# Patient Record
Sex: Female | Born: 1980
Health system: Southern US, Community
[De-identification: ages and names within clinical notes are randomized; demographics above are authoritative.]

## PROBLEM LIST (undated history)

## (undated) DIAGNOSIS — I10 Essential (primary) hypertension: Secondary | ICD-10-CM

## (undated) DIAGNOSIS — K219 Gastro-esophageal reflux disease without esophagitis: Secondary | ICD-10-CM

## (undated) DIAGNOSIS — G43909 Migraine, unspecified, not intractable, without status migrainosus: Secondary | ICD-10-CM

## (undated) HISTORY — DX: Essential (primary) hypertension: I10

## (undated) HISTORY — DX: Gastro-esophageal reflux disease without esophagitis: K21.9

## (undated) HISTORY — DX: Migraine, unspecified, not intractable, without status migrainosus: G43.909

## (undated) HISTORY — PX: CHOLECYSTECTOMY: SHX55

---

## 2000-07-09 ENCOUNTER — Encounter: Payer: Self-pay | Admitting: Obstetrics & Gynecology

## 2000-07-09 ENCOUNTER — Inpatient Hospital Stay (HOSPITAL_COMMUNITY): Admission: AD | Admit: 2000-07-09 | Discharge: 2000-07-09 | Payer: Self-pay | Admitting: Family Medicine

## 2000-11-15 ENCOUNTER — Other Ambulatory Visit: Admission: RE | Admit: 2000-11-15 | Discharge: 2000-11-15 | Payer: Self-pay | Admitting: Family Medicine

## 2001-01-05 ENCOUNTER — Ambulatory Visit (HOSPITAL_COMMUNITY): Admission: RE | Admit: 2001-01-05 | Discharge: 2001-01-05 | Payer: Self-pay | Admitting: Family Medicine

## 2001-01-05 ENCOUNTER — Encounter: Payer: Self-pay | Admitting: Family Medicine

## 2001-06-01 ENCOUNTER — Inpatient Hospital Stay (HOSPITAL_COMMUNITY): Admission: AD | Admit: 2001-06-01 | Discharge: 2001-06-03 | Payer: Self-pay | Admitting: Family Medicine

## 2001-06-12 ENCOUNTER — Encounter: Payer: Self-pay | Admitting: Family Medicine

## 2001-06-12 ENCOUNTER — Inpatient Hospital Stay (HOSPITAL_COMMUNITY): Admission: AD | Admit: 2001-06-12 | Discharge: 2001-06-12 | Payer: Self-pay | Admitting: Family Medicine

## 2001-12-11 ENCOUNTER — Encounter: Admission: RE | Admit: 2001-12-11 | Discharge: 2001-12-11 | Payer: Self-pay | Admitting: Family Medicine

## 2001-12-11 ENCOUNTER — Encounter: Payer: Self-pay | Admitting: Family Medicine

## 2002-04-22 ENCOUNTER — Encounter: Payer: Self-pay | Admitting: Family Medicine

## 2002-04-22 ENCOUNTER — Ambulatory Visit (HOSPITAL_COMMUNITY): Admission: RE | Admit: 2002-04-22 | Discharge: 2002-04-22 | Payer: Self-pay | Admitting: Family Medicine

## 2002-06-04 ENCOUNTER — Other Ambulatory Visit: Admission: RE | Admit: 2002-06-04 | Discharge: 2002-06-04 | Payer: Self-pay | Admitting: Family Medicine

## 2002-06-17 ENCOUNTER — Encounter: Payer: Self-pay | Admitting: Family Medicine

## 2002-06-17 ENCOUNTER — Encounter: Admission: RE | Admit: 2002-06-17 | Discharge: 2002-06-17 | Payer: Self-pay | Admitting: Family Medicine

## 2002-07-19 ENCOUNTER — Ambulatory Visit (HOSPITAL_COMMUNITY): Admission: RE | Admit: 2002-07-19 | Discharge: 2002-07-19 | Payer: Self-pay | Admitting: Family Medicine

## 2002-07-19 ENCOUNTER — Encounter: Payer: Self-pay | Admitting: Family Medicine

## 2002-10-11 ENCOUNTER — Inpatient Hospital Stay (HOSPITAL_COMMUNITY): Admission: AD | Admit: 2002-10-11 | Discharge: 2002-10-11 | Payer: Self-pay | Admitting: Family Medicine

## 2002-12-06 ENCOUNTER — Inpatient Hospital Stay (HOSPITAL_COMMUNITY): Admission: AD | Admit: 2002-12-06 | Discharge: 2002-12-09 | Payer: Self-pay | Admitting: Family Medicine

## 2004-03-12 ENCOUNTER — Other Ambulatory Visit: Admission: RE | Admit: 2004-03-12 | Discharge: 2004-03-12 | Payer: Self-pay | Admitting: Family Medicine

## 2005-05-10 ENCOUNTER — Ambulatory Visit (HOSPITAL_COMMUNITY): Admission: RE | Admit: 2005-05-10 | Discharge: 2005-05-10 | Payer: Self-pay | Admitting: General Surgery

## 2005-05-10 ENCOUNTER — Encounter (INDEPENDENT_AMBULATORY_CARE_PROVIDER_SITE_OTHER): Payer: Self-pay | Admitting: Specialist

## 2006-05-09 ENCOUNTER — Other Ambulatory Visit: Admission: RE | Admit: 2006-05-09 | Discharge: 2006-05-09 | Payer: Self-pay | Admitting: Family Medicine

## 2007-10-23 ENCOUNTER — Encounter: Admission: RE | Admit: 2007-10-23 | Discharge: 2007-10-23 | Payer: Self-pay | Admitting: Family Medicine

## 2010-02-12 ENCOUNTER — Ambulatory Visit (HOSPITAL_COMMUNITY): Admission: RE | Admit: 2010-02-12 | Discharge: 2010-02-12 | Payer: Self-pay | Admitting: Family Medicine

## 2010-05-15 ENCOUNTER — Encounter: Payer: Self-pay | Admitting: Family Medicine

## 2010-09-10 NOTE — Op Note (Signed)
Kelly Jennings, Kelly Jennings               ACCOUNT NO.:  [id number]   MEDICAL RECORD NO.:  [id number]          PATIENT TYPE:  AMB   LOCATION:  DAY                          FACILITY:  [facility]   PHYSICIAN:  Timothy E. Earlene Plater, M.D. DATE OF BIRTH:  1980/11/26   DATE OF PROCEDURE:  05/10/2005  DATE OF DISCHARGE:                                 OPERATIVE REPORT   PREOPERATIVE DIAGNOSIS:  Cholecystolithiasis.   POSTOPERATIVE DIAGNOSIS:  Cholecystolithiasis.   PROCEDURE:  Laparoscopic cholecystectomy with intraoperative cholangiogram.   SURGEON:  Timothy E. Earlene Plater, M.D.   ASSISTANT:  Raelyn Mora.   ANESTHESIA:  CRNA supervised the general endotracheal.   Ms. Diver is 89.  Slightly overweight.  Recent gravida 2.  Symptomatic  gallstones of an urgent nature.  She is fully informed before proceeding to  surgery.   Patient was taken to the operating room .  General endotracheal anesthesia  was administered.  The abdomen was prepped in the usual sterile fashion.  Marcaine 0.25% with epinephrine was used prior to each skin incision.  A  horizontal infraumbilical incision was made and fashioned vertically.  The  peritoneum entered without incident.  The Hasson cannula placed and tied in  place with a #1 Vicryl.  The abdomen insufflated.  General peritoneoscopy  was unremarkable.  A second 10 mm trocar was placed, and in the  epigastricum, two 5 mm trocars were unremarkable.  The gallbladder was tense  and full.  It was grasped and drawn up superior to the right.  Attention was  turned to the base of the gallbladder, where a normal-appearing cystic duct  and cystic artery were dissected out and clearly identified.  The cystic  artery was clipped x3  The cystic duct was clipped with the gallbladder.  An  opening made.  Trashy bile exuded.  It was irrigated, and then a catheter  was placed percutaneously into the cystic duct stump.  A real-time  cholangiogram was made and showed complete filling of the  biliary tree with  rapid emptying into the duodenum.  No defects were noted.  The clip and  catheter were removed.  The ribbon of the cystic duct triply clipped and  divided.  The artery divided and then the gallbladder was dissected from the  gallbladder bed without complication except for absence of dissecting space  and some hemorrhagic acute changes in the bed of the gallbladder.  The  gallbladder was placed in an EndoCatch bag, put aside, and the bed of the  gallbladder was carefully evaluated and cauterized where needed.  With that  complete, the bleeding stopped, and the irrigant cleared, the gallbladder  was removed from the abdomen through the infraumbilical incision, which was  tied under direct vision.  Copious irrigation was used, and then when it was  all clear, the irrigation, CO2 instruments, and trocars were removed under  direct vision.  Counts were correct.  She had tolerated it well.  Each wound  inspected and  closed with 4-0 Monocryl, Steri-Strips, and dry sterile dressing.  The  gallbladder was set aside.  I did remove one stone  for the patient, as she  requested, and the rest of the stones and specimens were sent to the lab.  She will be observed and discharged this evening, if all goes well.      Timothy E. Earlene Plater, M.D.  Electronically Signed     TED/MEDQ  D:  05/10/2005  T:  05/10/2005  Job:  [id number]   cc:   Ernestina Penna, M.D.  Fax: [phone]

## 2012-07-12 ENCOUNTER — Telehealth: Payer: Self-pay | Admitting: Nurse Practitioner

## 2012-07-12 NOTE — Telephone Encounter (Signed)
Can you please make appointment I cant make one right away

## 2012-07-12 NOTE — Telephone Encounter (Signed)
Tingling in hands and blood pressure running high. What to do? Take extra BP meds?

## 2012-07-12 NOTE — Telephone Encounter (Signed)
Please advise 

## 2012-07-12 NOTE — Telephone Encounter (Signed)
appt made

## 2012-07-12 NOTE — Telephone Encounter (Signed)
Patient says B/P running in 140's systolic. Says that hands are numb feeling since she woke up this morning. Spoke with patient please call her back and make her appointment.

## 2012-07-13 ENCOUNTER — Ambulatory Visit: Payer: Self-pay | Admitting: Nurse Practitioner

## 2012-09-24 ENCOUNTER — Telehealth: Payer: Self-pay | Admitting: Family Medicine

## 2012-09-24 ENCOUNTER — Ambulatory Visit (INDEPENDENT_AMBULATORY_CARE_PROVIDER_SITE_OTHER): Payer: BC Managed Care – PPO

## 2012-09-24 ENCOUNTER — Ambulatory Visit (INDEPENDENT_AMBULATORY_CARE_PROVIDER_SITE_OTHER): Payer: BC Managed Care – PPO | Admitting: Physician Assistant

## 2012-09-24 ENCOUNTER — Encounter: Payer: Self-pay | Admitting: Physician Assistant

## 2012-09-24 DIAGNOSIS — M25512 Pain in left shoulder: Secondary | ICD-10-CM

## 2012-09-24 DIAGNOSIS — M25519 Pain in unspecified shoulder: Secondary | ICD-10-CM

## 2012-09-24 DIAGNOSIS — K219 Gastro-esophageal reflux disease without esophagitis: Secondary | ICD-10-CM

## 2012-09-24 DIAGNOSIS — I10 Essential (primary) hypertension: Secondary | ICD-10-CM | POA: Insufficient documentation

## 2012-09-24 NOTE — Patient Instructions (Signed)

## 2012-09-24 NOTE — Telephone Encounter (Signed)
APPT MADE FOR TODAY WITH [facility]

## 2012-09-24 NOTE — Progress Notes (Signed)
Subjective:     Patient ID: Kelly Jennings, female   DOB: 07-24-1980, 32 y.o.   MRN: [id number]  HPI Pt with intermit L shoulder pain She states she will feel a pop and have intense pain to the L shoulder She denies any trauma to the L shoulder prev No prev hx know of sublux/dislocation Sx will last for a day and resolve Last sx were yest No sx at time of visit  Review of Systems  All other systems reviewed and are negative.       Objective:   Physical Exam  Nursing note and vitals reviewed.  NAD No TTP entire shoulder/scapular area No palp spasm No area of ecchy seen FROM w/o crepitus or click Good strength No sx with restricted abduction, on ant pronation/supination Good grip,sensory, and pulses distal Xray- no abnl seen, will be over-read    Assessment:     1. Shoulder pain, left   2. HTN (hypertension)   3. GERD (gastroesophageal reflux disease)        Plan:     Conserv tx for now Shoulder strengthening exercises reviewed OTC NSAIDS Heat/Ice If sx cont consider MRI

## 2012-10-16 ENCOUNTER — Encounter: Payer: Self-pay | Admitting: General Practice

## 2012-10-16 ENCOUNTER — Ambulatory Visit (INDEPENDENT_AMBULATORY_CARE_PROVIDER_SITE_OTHER): Payer: BC Managed Care – PPO | Admitting: General Practice

## 2012-10-16 VITALS — BP 124/93 | HR 74 | Temp 98.0°F | Ht 62.0 in | Wt 166.0 lb

## 2012-10-16 DIAGNOSIS — J322 Chronic ethmoidal sinusitis: Secondary | ICD-10-CM

## 2012-10-16 MED ORDER — AZITHROMYCIN 250 MG PO TABS: ORAL_TABLET | ORAL | Status: DC

## 2012-10-16 NOTE — Progress Notes (Signed)
  Subjective:    Patient ID: Kelly Jennings, female    DOB: 10/08/1980, 32 y.o.   MRN: [id number]  HPI Presents today with complaints of drainage down back of throat and ear pressure. Reports drainage is worse at night. Reports onset of drainage was two days ago and ear pressure this am. Reports taking claritin without relief.     Review of Systems  Constitutional: Negative for fever and chills.  HENT: Positive for ear pain, rhinorrhea, postnasal drip and sinus pressure. Negative for congestion, sore throat, neck pain, neck stiffness and ear discharge.   Respiratory: Negative for cough, chest tightness, shortness of breath and wheezing.   Cardiovascular: Negative for chest pain and palpitations.       Objective:   Physical Exam  Constitutional: She is oriented to person, place, and time. She appears well-developed and well-nourished.  HENT:  Head: Normocephalic and atraumatic.  Nose: Right sinus exhibits maxillary sinus tenderness. Left sinus exhibits maxillary sinus tenderness.  Cardiovascular: Normal rate, regular rhythm and normal heart sounds.   Pulmonary/Chest: Effort normal and breath sounds normal. No respiratory distress. She exhibits no tenderness.  Neurological: She is alert and oriented to person, place, and time.  Skin: Skin is warm and dry.  Psychiatric: She has a normal mood and affect.          Assessment & Plan:  1. Ethmoid sinusitis - azithromycin (ZITHROMAX) 250 MG tablet; Take as directed  Dispense: 6 tablet; Refill: 0 -Rest -increase fluid intake -change toothbrush in 3 days -RTO if symptoms worsen or seek emergency medical treatment -Patient verbalized understanding -Coralie Keens, FNP-C

## 2012-10-16 NOTE — Patient Instructions (Addendum)

## 2013-04-01 ENCOUNTER — Encounter: Payer: Self-pay | Admitting: General Practice

## 2013-04-01 ENCOUNTER — Ambulatory Visit (INDEPENDENT_AMBULATORY_CARE_PROVIDER_SITE_OTHER): Payer: BC Managed Care – PPO | Admitting: General Practice

## 2013-04-01 VITALS — BP 137/95 | HR 75 | Temp 98.3°F | Ht 62.5 in | Wt 161.0 lb

## 2013-04-01 DIAGNOSIS — Z0000 Encounter for general adult medical examination without abnormal findings: Secondary | ICD-10-CM

## 2013-04-01 DIAGNOSIS — I10 Essential (primary) hypertension: Secondary | ICD-10-CM

## 2013-04-01 DIAGNOSIS — K219 Gastro-esophageal reflux disease without esophagitis: Secondary | ICD-10-CM

## 2013-04-01 DIAGNOSIS — G43909 Migraine, unspecified, not intractable, without status migrainosus: Secondary | ICD-10-CM

## 2013-04-01 DIAGNOSIS — Z124 Encounter for screening for malignant neoplasm of cervix: Secondary | ICD-10-CM

## 2013-04-01 DIAGNOSIS — Z01419 Encounter for gynecological examination (general) (routine) without abnormal findings: Secondary | ICD-10-CM

## 2013-04-01 MED ORDER — TOPIRAMATE 25 MG PO TABS: 25.0000 mg | ORAL_TABLET | Freq: Every day | ORAL | Status: DC

## 2013-04-01 MED ORDER — ESOMEPRAZOLE MAGNESIUM 40 MG PO CPDR: 40.0000 mg | DELAYED_RELEASE_CAPSULE | Freq: Every day | ORAL | Status: DC

## 2013-04-01 MED ORDER — HYDROCHLOROTHIAZIDE 25 MG PO TABS: 25.0000 mg | ORAL_TABLET | Freq: Every day | ORAL | Status: DC

## 2013-04-01 NOTE — Patient Instructions (Signed)

## 2013-04-01 NOTE — Progress Notes (Signed)
Subjective:    Patient ID: Kelly Jennings, female    DOB: 07-13-1980, 32 y.o.   MRN: [id number]  HPI Patient presents today for annual exam. She has a history of hypertension, gerd, and headaches. She reports normally taking medications as prescribed, but has been out of hctz for 1 month. She reports being more stressed recently due to her job loss, that will occur on May 09, 2013 due to business relocating. Reports her husband is currently employed. She denies thoughts of harming self or others.    Review of Systems  Constitutional: Negative for fever and chills.  Respiratory: Negative for chest tightness and shortness of breath.   Cardiovascular: Negative for chest pain and palpitations.  Gastrointestinal: Negative for nausea, vomiting, abdominal pain, constipation and blood in stool.  Genitourinary: Negative for dysuria, hematuria and difficulty urinating.  Neurological: Negative for dizziness, weakness and headaches.       Objective:   Physical Exam  Constitutional: She is oriented to person, place, and time. She appears well-developed and well-nourished.  HENT:  Head: Normocephalic and atraumatic.  Right Ear: External ear normal.  Left Ear: External ear normal.  Mouth/Throat: Oropharynx is clear and moist.  Eyes: Conjunctivae and EOM are normal. Pupils are equal, round, and reactive to light.  Neck: Normal range of motion. Neck supple. No thyromegaly present.  Cardiovascular: Normal rate, regular rhythm, normal heart sounds and intact distal pulses.   Pulmonary/Chest: Effort normal and breath sounds normal. No respiratory distress. She exhibits no tenderness. Right breast exhibits no inverted nipple, no mass, no nipple discharge, no skin change and no tenderness. Left breast exhibits no inverted nipple, no mass, no nipple discharge, no skin change and no tenderness. Breasts are symmetrical.  Abdominal: Soft. Bowel sounds are normal. She exhibits no distension.    Genitourinary: Vagina normal and uterus normal. No breast swelling, tenderness, discharge or bleeding. No labial fusion. There is no rash, tenderness, lesion or injury on the right labia. There is no rash, tenderness, lesion or injury on the left labia. Uterus is not deviated, not enlarged, not fixed and not tender. Cervix exhibits no motion tenderness, no discharge and no friability. Right adnexum displays no mass, no tenderness and no fullness. Left adnexum displays no mass, no tenderness and no fullness. No erythema, tenderness or bleeding around the vagina. No foreign body around the vagina. No signs of injury around the vagina. No vaginal discharge found.  Lymphadenopathy:    She has no cervical adenopathy.  Neurological: She is alert and oriented to person, place, and time.  Skin: Skin is warm and dry.  Psychiatric: She has a normal mood and affect.          Assessment & Plan:  1. Annual physical exam  - POCT CBC - CMP14+EGFR - Pap IG w/ reflex to HPV when ASC-U  2. Hypertension  - hydrochlorothiazide (HYDRODIURIL) 25 MG tablet; Take 1 tablet (25 mg total) by mouth daily.  Dispense: 90 tablet; Refill: 1 -discussed importance of taking meds as prescribed and not being without medications  3. Migraine headache  - topiramate (TOPAMAX) 25 MG tablet; Take 1 tablet (25 mg total) by mouth daily.  Dispense: 90 tablet; Refill: 1  4. GERD (gastroesophageal reflux disease)  - esomeprazole (NEXIUM) 40 MG capsule; Take 1 capsule (40 mg total) by mouth daily before breakfast.  Dispense: 90 capsule; Refill: 1 -Continue all current medications Labs pending F/u in 3 months Discussed benefits of regular exercise and healthy eating Patient verbalized  understanding Coralie Keens, FNP-C

## 2013-04-03 LAB — PAP IG W/ RFLX HPV ASCU

## 2013-04-16 ENCOUNTER — Telehealth: Payer: Self-pay | Admitting: *Deleted

## 2013-04-16 NOTE — Telephone Encounter (Signed)
Message copied by Almeta Monas on Tue Apr 16, 2013  3:03 PM ------      Message from: Philomena Doheny E      Created: Mon Apr 08, 2013  8:27 AM       Please inform pap results are negative. Follow up with routine ------

## 2013-04-16 NOTE — Telephone Encounter (Signed)
Details left with normal pap results.

## 2013-05-30 ENCOUNTER — Ambulatory Visit (INDEPENDENT_AMBULATORY_CARE_PROVIDER_SITE_OTHER): Payer: BC Managed Care – PPO | Admitting: Family Medicine

## 2013-05-30 ENCOUNTER — Encounter (INDEPENDENT_AMBULATORY_CARE_PROVIDER_SITE_OTHER): Payer: Self-pay

## 2013-05-30 ENCOUNTER — Encounter: Payer: Self-pay | Admitting: Family Medicine

## 2013-05-30 ENCOUNTER — Telehealth: Payer: Self-pay | Admitting: General Practice

## 2013-05-30 VITALS — BP 117/79 | HR 80 | Temp 97.4°F | Ht 62.5 in | Wt 158.0 lb

## 2013-05-30 DIAGNOSIS — E876 Hypokalemia: Secondary | ICD-10-CM

## 2013-05-30 DIAGNOSIS — I951 Orthostatic hypotension: Secondary | ICD-10-CM

## 2013-05-30 DIAGNOSIS — R5381 Other malaise: Secondary | ICD-10-CM

## 2013-05-30 DIAGNOSIS — K2990 Gastroduodenitis, unspecified, without bleeding: Secondary | ICD-10-CM

## 2013-05-30 DIAGNOSIS — R5383 Other fatigue: Secondary | ICD-10-CM

## 2013-05-30 DIAGNOSIS — K2970 Gastritis, unspecified, without bleeding: Secondary | ICD-10-CM

## 2013-05-30 DIAGNOSIS — D72829 Elevated white blood cell count, unspecified: Secondary | ICD-10-CM

## 2013-05-30 DIAGNOSIS — K219 Gastro-esophageal reflux disease without esophagitis: Secondary | ICD-10-CM

## 2013-05-30 DIAGNOSIS — R110 Nausea: Secondary | ICD-10-CM

## 2013-05-30 DIAGNOSIS — R55 Syncope and collapse: Secondary | ICD-10-CM

## 2013-05-30 LAB — POCT UA - MICROSCOPIC ONLY
Bacteria, U Microscopic: NEGATIVE
Casts, Ur, LPF, POC: NEGATIVE
Crystals, Ur, HPF, POC: NEGATIVE
Mucus, UA: NEGATIVE
RBC, urine, microscopic: NEGATIVE
Yeast, UA: NEGATIVE

## 2013-05-30 LAB — POCT URINALYSIS DIPSTICK
Bilirubin, UA: NEGATIVE
Blood, UA: NEGATIVE
Glucose, UA: NEGATIVE
Nitrite, UA: NEGATIVE
Protein, UA: NEGATIVE
Spec Grav, UA: 1.01
Urobilinogen, UA: NEGATIVE
pH, UA: 7.5

## 2013-05-30 LAB — POCT CBC
Granulocyte percent: 93.6 %G — AB (ref 37–80)
HCT, POC: 43.6 % (ref 37.7–47.9)
Hemoglobin: 14.6 g/dL (ref 12.2–16.2)
Lymph, poc: 0.4 — AB (ref 0.6–3.4)
MCH, POC: 31.5 pg — AB (ref 27–31.2)
MCHC: 33.4 g/dL (ref 31.8–35.4)
MCV: 94.4 fL (ref 80–97)
MPV: 8.6 fL (ref 0–99.8)
POC Granulocyte: 8.9 — AB (ref 2–6.9)
POC LYMPH PERCENT: 4.1 %L — AB (ref 10–50)
Platelet Count, POC: 168 10*3/uL (ref 142–424)
RBC: 4.6 M/uL (ref 4.04–5.48)
RDW, POC: 12.7 %
WBC: 9.5 10*3/uL (ref 4.6–10.2)

## 2013-05-30 LAB — POCT INFLUENZA A/B
Influenza A, POC: NEGATIVE
Influenza B, POC: NEGATIVE

## 2013-05-30 MED ORDER — POTASSIUM CHLORIDE CRYS ER 20 MEQ PO TBCR: 20.0000 meq | EXTENDED_RELEASE_TABLET | Freq: Two times a day (BID) | ORAL | Status: DC

## 2013-05-30 MED ORDER — SUCRALFATE 1 G PO TABS: 1.0000 g | ORAL_TABLET | Freq: Three times a day (TID) | ORAL | Status: DC

## 2013-05-30 MED ORDER — ONDANSETRON 8 MG PO TBDP: 8.0000 mg | ORAL_TABLET | Freq: Three times a day (TID) | ORAL | Status: DC | PRN

## 2013-05-30 MED ORDER — CIPROFLOXACIN HCL 500 MG PO TABS: 500.0000 mg | ORAL_TABLET | Freq: Two times a day (BID) | ORAL | Status: DC

## 2013-05-30 NOTE — Telephone Encounter (Signed)
Appt given for today per patients request 

## 2013-05-30 NOTE — Progress Notes (Signed)
Subjective:    Patient ID: Kelly Jennings, female    DOB: 1981-02-14, 33 y.o.   MRN: [id number]  HPI This 33 y.o. female presents for evaluation of syncopal episode last evening.  She went to [facility] and was told she needs to go see her primary care provider for syncope and for slow heart rate.  She states she passed out 3 times yesterday.  She states she was told that her heart rate Was slow on the EKG at the hospital.  She is nauseated and weak feeling.  She states she was given Some potassium from the ED for low potassium.  She states she has some epigastric discomfort and she is nauseated.  She is dieting and on weight loss medicine.  She states she has been passing out For 3 years now.  She states her syncope has become increased over the last few days.  Last night she awoke at 0230 and got up to urinate and then ambulated to the toilet and then while voiding had Abdominal spasm or pain and then had beads of sweat develop and then she fell off the commode and hit her chin on the floor which required her to go to the ED.  Her records from [facility] ED states she had normal EKg, normal CXR, elevated wbc of 14.4,  Low K of 3.1, negative CE's, and normal PTINR.  She received sutures for a laceration to her chin.   Review of Systems C/o fatigue, syncope, and nausea No chest pain, SOB, HA, dizziness, vision change, N/V, diarrhea, constipation, dysuria, urinary urgency or frequency, myalgias, arthralgias or rash.     Objective:   Physical Exam Vital signs noted  Well developed well nourished female.  HEENT - Head atraumatic Normocephalic                Eyes - PERRLA, Conjuctiva - clear Sclera- Clear EOMI                Ears - EAC's Wnl TM's Wnl Gross Hearing WNL                Throat - oropharanx wnl                 Chin - Laceration approximated well with sutures. Respiratory - Lungs CTA bilateral Cardiac - RRR S1 and S2 without murmur.  Standing BP 90/60, HR - 110 GI -  Abdomen soft tender epigastric area and bowel sounds active x 4    Results for orders placed in visit on 05/30/13  POCT UA - MICROSCOPIC ONLY      Result Value Range   WBC, Ur, HPF, POC 5-10     RBC, urine, microscopic neg     Bacteria, U Microscopic neg     Mucus, UA neg     Epithelial cells, urine per micros occ     Crystals, Ur, HPF, POC neg     Casts, Ur, LPF, POC neg     Yeast, UA neg    POCT URINALYSIS DIPSTICK      Result Value Range   Color, UA yellow     Clarity, UA clear     Glucose, UA neg     Bilirubin, UA neg     Ketones, UA mod     Spec Grav, UA 1.010     Blood, UA neg     pH, UA 7.5     Protein, UA neg     Urobilinogen, UA negative  Nitrite, UA neg     Leukocytes, UA Trace    POCT INFLUENZA A/B      Result Value Range   Influenza A, POC Negative     Influenza B, POC Negative        Assessment & Plan:  Syncope - Plan: POCT CBC, POCT UA - Microscopic Only, POCT urinalysis dipstick, BMP8+EGFR, Vitamin B12, Ambulatory referral to Cardiology, CANCELED: EKG 12-Lead. Since she has this as ongoing problem refer to cardiology to r/o arrythmia.  Discussed she needs To DC diet medicine, and HCTZ.    Orthostasis - Plan: POCT CBC, POCT UA - Microscopic Only, POCT urinalysis dipstick, BMP8+EGFR, Vitamin B12, ciprofloxacin (CIPRO) 500 MG tablet.   DC HCTZ  Other malaise and fatigue - Plan: POCT CBC, POCT UA - Microscopic Only, POCT urinalysis dipstick, BMP8+EGFR, Vitamin B12, TSH, POCT Influenza A/B.  DC HCTZ  Leukocytosis, unspecified - Plan: ciprofloxacin (CIPRO) 500 MG tablet po bid x 7 days for suspected UTI.  Order UA cx.  Hypokalemia - Plan: potassium chloride SA (K-DUR,KLOR-CON) 20 MEQ tablet po bid x 5 days  Gastritis - Plan: sucralfate (CARAFATE) 1 G tablet po qac and hx x 2 weeks  GERD (gastroesophageal reflux disease) - Plan: sucralfate (CARAFATE) 1 G tablet and continue with nexium  Nausea alone - Plan: ondansetron (ZOFRAN ODT) 8 MG disintegrating  tablet  Follow up next week  Lysbeth Penner FNP

## 2013-05-31 ENCOUNTER — Telehealth: Payer: Self-pay | Admitting: Family Medicine

## 2013-05-31 LAB — BMP8+EGFR
BUN/Creatinine Ratio: 17 (ref 8–20)
BUN: 11 mg/dL (ref 6–20)
CO2: 23 mmol/L (ref 18–29)
Calcium: 9.3 mg/dL (ref 8.7–10.2)
Chloride: 102 mmol/L (ref 97–108)
Creatinine, Ser: 0.64 mg/dL (ref 0.57–1.00)
GFR calc Af Amer: 136 mL/min/{1.73_m2} (ref >59)
GFR calc non Af Amer: 118 mL/min/{1.73_m2} (ref >59)
Glucose: 102 mg/dL — ABNORMAL HIGH (ref 65–99)
Potassium: 5.1 mmol/L (ref 3.5–5.2)
Sodium: 141 mmol/L (ref 134–144)

## 2013-05-31 LAB — VITAMIN B12: Vitamin B-12: 299 pg/mL (ref 211–946)

## 2013-05-31 LAB — URINE CULTURE: Organism ID, Bacteria: NO GROWTH

## 2013-05-31 LAB — TSH: TSH: 0.372 u[IU]/mL — ABNORMAL LOW (ref 0.450–4.500)

## 2013-06-03 ENCOUNTER — Other Ambulatory Visit: Payer: Self-pay | Admitting: Family Medicine

## 2013-06-03 NOTE — Telephone Encounter (Signed)
Pt called to check on referral status. I gave her the number to call to make the appointment herself since it was in the workqueue. Pt is scheduled for 06/15/23 and letter sent also as a reminder with the appointment date/time

## 2013-06-10 ENCOUNTER — Encounter: Payer: Self-pay | Admitting: Cardiovascular Disease

## 2013-06-10 ENCOUNTER — Ambulatory Visit: Payer: BC Managed Care – PPO | Admitting: Family Medicine

## 2013-06-12 ENCOUNTER — Ambulatory Visit (INDEPENDENT_AMBULATORY_CARE_PROVIDER_SITE_OTHER): Payer: BC Managed Care – PPO | Admitting: Cardiovascular Disease

## 2013-06-12 ENCOUNTER — Encounter: Payer: Self-pay | Admitting: Cardiovascular Disease

## 2013-06-12 VITALS — BP 130/80 | HR 78 | Ht 62.5 in | Wt 158.0 lb

## 2013-06-12 DIAGNOSIS — R55 Syncope and collapse: Secondary | ICD-10-CM

## 2013-06-12 DIAGNOSIS — I10 Essential (primary) hypertension: Secondary | ICD-10-CM

## 2013-06-12 NOTE — Patient Instructions (Signed)
You have been referred to NEXT  AVAILABLE  WITH  EP  FOR   VASO  VAGAL  SYNCOPE Your physician recommends that you continue on your current medications as directed. Please refer to the Current Medication list given to you today.  Your physician has requested that you have an exercise tolerance test. For further information please visit [email or web address]. Please also follow instruction sheet, as given.   Your physician has requested that you have an echocardiogram. Echocardiography is a painless test that uses sound waves to create images of your heart. It provides your doctor with information about the size and shape of your heart and how well your heart's chambers and valves are working. This procedure takes approximately one hour. There are no restrictions for this procedure.

## 2013-06-12 NOTE — Progress Notes (Signed)
Patient ID: Delton CoombesRegina D Jennings, female   DOB: 1980/08/26, 33 y.o.   MRN: [id number]  33 yo referred by [facility] for syncope.  Seen there 05/30/13  Has had stereotypical symptoms for years.  Wakes up in middle of night with sensation that she has to urinate.  Goes to bathroom and has trouble going.  The gets nausea and stomach cramping followed by diaphoresis and a wave of light headedness. Has hit the back of her head once and on 2/5 had small cut on her chin.  No family history of sudden death.  No brothers or sisters.  Father had bypass at older age.  In ER she reports that she had an episode and BP "bottomed out" but HR was fine.  No seizure activity.  No history of neurologic abnormalities.  Has not had recurrence in last 2 weeks.  Had these types of episodes for years.  No previous cardiac w/u     ROS: Denies fever, malais, weight loss, blurry vision, decreased visual acuity, cough, sputum, SOB, hemoptysis, pleuritic pain, palpitaitons, heartburn, abdominal pain, melena, lower extremity edema, claudication, or rash.  All other systems reviewed and negative   General: Affect appropriate Healthy:  appears stated age HEENT: normal Neck supple with no adenopathy JVP normal no bruits no thyromegaly Lungs clear with no wheezing and good diaphragmatic motion Heart:  S1/S2 no murmur,rub, gallop or click PMI normal Abdomen: benighn, BS positve, no tenderness, no AAA no bruit.  No HSM or HJR Distal pulses intact with no bruits No edema Neuro non-focal Skin warm and dry No muscular weakness  Medications Current Outpatient Prescriptions  Medication Sig Dispense Refill  . ciprofloxacin (CIPRO) 500 MG tablet Take 1 tablet (500 mg total) by mouth 2 (two) times daily.  20 tablet  0  . esomeprazole (NEXIUM) 40 MG capsule Take 1 capsule (40 mg total) by mouth daily before breakfast.  90 capsule  1  . hydrochlorothiazide (HYDRODIURIL) 25 MG tablet Take 1 tablet (25 mg total) by mouth daily.  90 tablet   1  . ondansetron (ZOFRAN ODT) 8 MG disintegrating tablet Take 1 tablet (8 mg total) by mouth every 8 (eight) hours as needed for nausea or vomiting.  20 tablet  0  . potassium chloride SA (K-DUR,KLOR-CON) 20 MEQ tablet Take 1 tablet (20 mEq total) by mouth 2 (two) times daily.  10 tablet  0  . sucralfate (CARAFATE) 1 G tablet Take 1 tablet (1 g total) by mouth 4 (four) times daily -  with meals and at bedtime.  56 tablet  0  . topiramate (TOPAMAX) 25 MG tablet Take 1 tablet (25 mg total) by mouth daily.  90 tablet  1   No current facility-administered medications for this visit.    Allergies Penicillins  Family History: Family History  Problem Relation Age of Onset  . Hyperlipidemia Mother   . Hypertension Mother   . Hyperlipidemia Father   . Hypertension Father   . Diabetes Father     Social History: History   Social History  . Marital Status: Married    Spouse Name: N/A    Number of Children: N/A  . Years of Education: N/A   Occupational History  . Not on file.   Social History Main Topics  . Smoking status: Never Smoker   . Smokeless tobacco: Never Used  . Alcohol Use: No  . Drug Use: No  . Sexual Activity: Not on file   Other Topics Concern  . Not on  file   Social History Narrative  . No narrative on file    Electrocardiogram:  SR rate 72 normal   Assessment and Plan

## 2013-06-12 NOTE — Assessment & Plan Note (Signed)
Seems like classic stereo typical episodes with vasodepressor response not accompanied by bradycardia.  Agree with stopping diuretic K was only 3.1 in ER  F/U ETT and echo to r/o structural heart disease and assess hemodynamic response to stress/exercise.  F/U with EP to further discuss

## 2013-06-12 NOTE — Assessment & Plan Note (Signed)
Diuretic stopped in ER 2 weeks ago f/u with primary Consider amlodipine if anything needs to be restarted

## 2013-06-13 ENCOUNTER — Other Ambulatory Visit (INDEPENDENT_AMBULATORY_CARE_PROVIDER_SITE_OTHER): Payer: BC Managed Care – PPO

## 2013-06-13 ENCOUNTER — Other Ambulatory Visit: Payer: Self-pay | Admitting: *Deleted

## 2013-06-13 ENCOUNTER — Telehealth: Payer: Self-pay | Admitting: Cardiovascular Disease

## 2013-06-13 ENCOUNTER — Ambulatory Visit: Payer: BC Managed Care – PPO | Admitting: *Deleted

## 2013-06-13 DIAGNOSIS — E0590 Thyrotoxicosis, unspecified without thyrotoxic crisis or storm: Secondary | ICD-10-CM

## 2013-06-13 DIAGNOSIS — R55 Syncope and collapse: Secondary | ICD-10-CM

## 2013-06-13 NOTE — Progress Notes (Signed)
Pt came in for labs only 

## 2013-06-13 NOTE — Telephone Encounter (Signed)
Returned call to patient no answer.LMTC. 

## 2013-06-13 NOTE — Telephone Encounter (Signed)
New Problem:  Pt is requesting a doctors note to excuse her from work for yesterday - 06/12/13.   [phone] - Pt's fax #

## 2013-06-13 NOTE — Progress Notes (Signed)
Patient ID: Kelly CoombesRegina D Ellis, female   DOB: 1980-11-14, 33 y.o.   MRN: [id number] holter monitor placed

## 2013-06-14 ENCOUNTER — Institutional Professional Consult (permissible substitution): Payer: Self-pay | Admitting: Cardiovascular Disease

## 2013-06-14 ENCOUNTER — Other Ambulatory Visit: Payer: Self-pay | Admitting: *Deleted

## 2013-06-14 DIAGNOSIS — E0590 Thyrotoxicosis, unspecified without thyrotoxic crisis or storm: Secondary | ICD-10-CM

## 2013-06-14 LAB — THYROID PANEL WITH TSH
Free Thyroxine Index: 1.9 (ref 1.2–4.9)
T3 Uptake Ratio: 26 % (ref 24–39)
T4, Total: 7.3 ug/dL (ref 4.5–12.0)
TSH: 0.866 u[IU]/mL (ref 0.450–4.500)

## 2013-06-17 NOTE — Telephone Encounter (Signed)
HAD APPT ON  06-12-13 MAY   I WRITE  WORK  EXCUSE FOR THAT DAY .Zack Seal/CY

## 2013-06-17 NOTE — Telephone Encounter (Signed)
Ok

## 2013-06-18 ENCOUNTER — Encounter: Payer: Self-pay | Admitting: *Deleted

## 2013-06-18 NOTE — Telephone Encounter (Signed)
Pt notified will fax  Work excuse note ./cy

## 2013-06-19 ENCOUNTER — Encounter (HOSPITAL_COMMUNITY): Payer: Self-pay

## 2013-06-20 ENCOUNTER — Ambulatory Visit (HOSPITAL_COMMUNITY): Payer: Self-pay

## 2013-06-21 ENCOUNTER — Ambulatory Visit (HOSPITAL_COMMUNITY)
Admission: RE | Admit: 2013-06-21 | Discharge: 2013-06-21 | Disposition: A | Discharge status: Home / Self Care | Payer: BC Managed Care – PPO | Source: Ambulatory Visit | Attending: Cardiovascular Disease | Admitting: Cardiovascular Disease

## 2013-06-21 DIAGNOSIS — R55 Syncope and collapse: Secondary | ICD-10-CM | POA: Insufficient documentation

## 2013-06-25 ENCOUNTER — Institutional Professional Consult (permissible substitution): Payer: Self-pay | Admitting: Internal Medicine

## 2013-06-25 ENCOUNTER — Telehealth: Payer: Self-pay | Admitting: *Deleted

## 2013-06-25 NOTE — Telephone Encounter (Signed)
Normal ETT ----- Message ----- From: Mickel Duhamelobin C. Moffitt Sent: 06/24/2013 2:43 PM To: Wendall StadePeter C Nishan, MD  PT  AWARE  OF STRESS TEST RESULTS .Zack Seal/CY

## 2013-06-26 ENCOUNTER — Telehealth: Payer: Self-pay | Admitting: Cardiovascular Disease

## 2013-06-26 NOTE — Telephone Encounter (Signed)
New Prob   Pt reports cold chills, dizziness, nausea, abdominal cramping, and lightheadedness upon standing and sometimes at rest. Pt is concerned and would like to speak to a nurse regarding this.

## 2013-06-26 NOTE — Telephone Encounter (Signed)
LMTCB ./CY 

## 2013-06-26 NOTE — Telephone Encounter (Signed)
SPOKE WITH  PT   RE MESSAGE PER  PT  THESE  SYMPTOMS ARE  WHAT  SHE EXPERIENCES  PRIOR TO   PASSING OUT  WAS  INSTRUCTED TO CALL  IF EXPERIENCED AGAIN  HAS  ECHO  AND APPT WITH  DR  Ladona RidgelAYLOR TOMORROW  HOLTER   AND  GXT  WERE BOTH NORMAL   ENCOURAGED PT TO KEEP APPT'S TOM  WILL FORWARD TO  DR Eden EmmsNISHAN FOR  REVIEW .Zack Seal/CY

## 2013-06-27 ENCOUNTER — Ambulatory Visit (HOSPITAL_COMMUNITY)
Admission: RE | Admit: 2013-06-27 | Discharge: 2013-06-27 | Disposition: A | Discharge status: Home / Self Care | Payer: BC Managed Care – PPO | Source: Ambulatory Visit | Attending: Cardiology | Admitting: Cardiology

## 2013-06-27 ENCOUNTER — Encounter: Payer: Self-pay | Admitting: Internal Medicine

## 2013-06-27 ENCOUNTER — Encounter: Payer: Self-pay | Admitting: *Deleted

## 2013-06-27 ENCOUNTER — Ambulatory Visit (INDEPENDENT_AMBULATORY_CARE_PROVIDER_SITE_OTHER): Payer: BC Managed Care – PPO | Admitting: Internal Medicine

## 2013-06-27 VITALS — BP 123/89 | HR 84 | Ht 62.0 in | Wt 162.0 lb

## 2013-06-27 DIAGNOSIS — R55 Syncope and collapse: Secondary | ICD-10-CM

## 2013-06-27 DIAGNOSIS — I10 Essential (primary) hypertension: Secondary | ICD-10-CM

## 2013-06-27 NOTE — Progress Notes (Signed)
      HPI Kelly Jennings is referred today by Dr. Eden EmmsNishan for evaluation of syncope. She is a very pleasant 33 yo woman who notes that her episodes of syncope began approximately 3 years ago after a bad episode of the flu. Since then she has had 7 episodes of syncope, always associated with getting up out of bed in the middle of the night and going to the bathroom. She will have a few second warning and then pass out. No loss of bowel or bladder continence. She is out for seconds at a time. After the episodes occur, she will fool tired for several hours. No heart racing or palpitations. She has not seriously injured herself but has had diarrhea after the episodes. The patient admits to excessive caffiene intake. She also admits to diaphoresis and abdominal cramps. Evaluation to date has been unremarkable with normal LV function. Allergies  Allergen Reactions  . Penicillins      Current Outpatient Prescriptions  Medication Sig Dispense Refill  . ondansetron (ZOFRAN) 8 MG tablet Take by mouth every 8 (eight) hours as needed for nausea or vomiting.       No current facility-administered medications for this visit.     Past Medical History  Diagnosis Date  . GERD (gastroesophageal reflux disease)   . Hypertension     ROS:   All systems reviewed and negative except as noted in the HPI.   Past Surgical History  Procedure Laterality Date  . Cholecystectomy       Family History  Problem Relation Age of Onset  . Hyperlipidemia Mother   . Hypertension Mother   . Hyperlipidemia Father   . Hypertension Father   . Diabetes Father      History   Social History  . Marital Status: Married    Spouse Name: N/A    Number of Children: N/A  . Years of Education: N/A   Occupational History  . Not on file.   Social History Main Topics  . Smoking status: Never Smoker   . Smokeless tobacco: Never Used  . Alcohol Use: No  . Drug Use: No  . Sexual Activity: Not on file   Other  Topics Concern  . Not on file   Social History Narrative  . No narrative on file     BP 123/89  Pulse 84  Ht 5\' 2"  (1.575 m)  Wt 162 lb (73.483 kg)  BMI 29.62 kg/m2  Physical Exam:  Well appearing 33 yo woman, NAD HEENT: Unremarkable Neck:  No JVD, no thyromegally Back:  No CVA tenderness Lungs:  Clear with no wheezes, rales, or rhonchi HEART:  Regular rate rhythm, no murmurs, no rubs, no clicks Abd:  soft, positive bowel sounds, no organomegally, no rebound, no guarding Ext:  2 plus pulses, no edema, no cyanosis, no clubbing Skin:  No rashes no nodules Neuro:  CN II through XII intact, motor grossly intact  EKG - NSR   Assess/Plan:

## 2013-06-27 NOTE — Assessment & Plan Note (Signed)
I have discussed the treatment options with the patient and have recommended that she stop caffiene by weaning gradually. Also, I have asked the patient to increase her sodium intake.  Finally, she will increase her physical activity. If these conservative measuse are unhelpful, we would consider a trial of florinef or midodrine.

## 2013-06-27 NOTE — Progress Notes (Signed)
2D Echo Performed 06/27/2013    Jalaina Salyers, RCS  

## 2013-06-27 NOTE — Patient Instructions (Signed)
Your physician recommends that you schedule a follow-up appointment in: 6 weeks with Dr. Taylor  

## 2013-06-27 NOTE — Assessment & Plan Note (Signed)
I would like to allow her blood pressure to remain elevated. Normal or low blood pressure would likely increase the risk of recurrent syncope.

## 2013-06-28 ENCOUNTER — Encounter: Payer: Self-pay | Admitting: Internal Medicine

## 2013-07-02 ENCOUNTER — Telehealth: Payer: Self-pay | Admitting: Cardiovascular Disease

## 2013-07-02 NOTE — Telephone Encounter (Signed)
PT AWARE OF ECHO RESULTS./CY 

## 2013-07-02 NOTE — Telephone Encounter (Signed)
New Message:  Pt states she is returning Christine's call.

## 2013-07-16 ENCOUNTER — Encounter: Payer: Self-pay | Admitting: Family Medicine

## 2013-07-16 ENCOUNTER — Ambulatory Visit (INDEPENDENT_AMBULATORY_CARE_PROVIDER_SITE_OTHER): Payer: BC Managed Care – PPO | Admitting: Family Medicine

## 2013-07-16 ENCOUNTER — Encounter (INDEPENDENT_AMBULATORY_CARE_PROVIDER_SITE_OTHER): Payer: Self-pay

## 2013-07-16 VITALS — BP 113/75 | HR 82 | Temp 98.2°F | Ht 62.0 in | Wt 164.0 lb

## 2013-07-16 DIAGNOSIS — F411 Generalized anxiety disorder: Secondary | ICD-10-CM

## 2013-07-16 DIAGNOSIS — J329 Chronic sinusitis, unspecified: Secondary | ICD-10-CM

## 2013-07-16 DIAGNOSIS — R55 Syncope and collapse: Secondary | ICD-10-CM

## 2013-07-16 MED ORDER — LEVOFLOXACIN 500 MG PO TABS: 500.0000 mg | ORAL_TABLET | Freq: Every day | ORAL | Status: DC

## 2013-07-16 MED ORDER — SERTRALINE HCL 50 MG PO TABS: 50.0000 mg | ORAL_TABLET | Freq: Every day | ORAL | Status: DC

## 2013-07-16 MED ORDER — METHYLPREDNISOLONE (PAK) 4 MG PO TABS: ORAL_TABLET | ORAL | Status: DC

## 2013-07-16 MED ORDER — FLUCONAZOLE 150 MG PO TABS: 150.0000 mg | ORAL_TABLET | Freq: Once | ORAL | Status: DC

## 2013-07-16 MED ORDER — ALPRAZOLAM 0.25 MG PO TABS: 0.2500 mg | ORAL_TABLET | Freq: Every evening | ORAL | Status: DC | PRN

## 2013-07-16 NOTE — Patient Instructions (Signed)

## 2013-07-16 NOTE — Progress Notes (Signed)
   Subjective:    Patient ID: Kelly Jennings, female    DOB: Aug 18, 1980, 33 y.o.   MRN: [id number]  HPI This 33 y.o. female presents for evaluation of facial pain and mucopurulent sinus drainage and fever. She gets sinus infections on occasion.  She has been having problems with syncope and she has been referred to cardiology.  She states the cardiologist suggested that the underlying problem is  Vasovagal syncope related to anxiety.  She admits to panic and GAD sx's that occur frequently over 3 times a week.  She has hx of migraines as well. She has not been taking her topamax daily and takes it prn.   Review of Systems C/o sinus infection, facial pain, headaches, and anxiety No chest pain, SOB, dizziness, vision change, N/V, diarrhea, constipation, dysuria, urinary urgency or frequency, myalgias, arthralgias or rash.     Objective:   Physical Exam  Vital signs noted  Well developed well nourished female.  HEENT - Head atraumatic Normocephalic                Eyes - PERRLA, Conjuctiva - clear Sclera- Clear EOMI                Ears - EAC's Wnl TM's Wnl Gross Hearing WNL                Nose - Nares decreased patency bilateral                Face - TTP bilateral maxillary sinuses                Throat - oropharanx wnl Respiratory - Lungs CTA bilateral Cardiac - RRR S1 and S2 without murmur GI - Abdomen soft Nontender and bowel sounds active x 4       Assessment & Plan:  Sinusitis - Plan: levofloxacin (LEVAQUIN) 500 MG tablet, methylPREDNIsolone (MEDROL DOSPACK) 4 MG tablet, Push po fluids, rest, tylenol and motrin otc prn as directed for fever, arthralgias, and myalgias.  Follow up prn if sx's continue or persist.  Vasovagal syncope - Follow up with Cardiology  GAD - Sertraline 50mg  po qd #30 w/11 rf.  Xanax 0.25mg  one po bid prn anxiety #30w/3 rf Follow up in one month or prn.  Migraine headaches - Take topamax as prescribed daily and this may improve once anxiety is  controlled better.  Deatra CanterWilliam J Adan Baehr FNP

## 2013-08-06 ENCOUNTER — Encounter: Payer: Self-pay | Admitting: Internal Medicine

## 2013-08-06 ENCOUNTER — Ambulatory Visit (INDEPENDENT_AMBULATORY_CARE_PROVIDER_SITE_OTHER): Payer: BC Managed Care – PPO | Admitting: Internal Medicine

## 2013-08-06 VITALS — BP 118/78 | HR 75 | Ht 62.0 in | Wt 160.0 lb

## 2013-08-06 DIAGNOSIS — R55 Syncope and collapse: Secondary | ICD-10-CM

## 2013-08-06 NOTE — Patient Instructions (Signed)
Your physician wants you to follow-up in: 6 months with Dr Taylor You will receive a reminder letter in the mail two months in advance. If you don't receive a letter, please call our office to schedule the follow-up appointment.  

## 2013-08-06 NOTE — Assessment & Plan Note (Signed)
Her symptoms are stable. She will continue to try and avoid caffeine and increased sodium and fluid intake. I will see her back in 6 months.

## 2013-08-06 NOTE — Progress Notes (Signed)
      HPI Mrs. Dayton ScrapeMurray returns today for followup. She is a pleasant 33 yo woman with a h/o unexplained syncope, thought due to autonomic dysfunction, who I saw last several weeks ago. In the interim, she has not had any additional episodes of syncope. She denies chest pain or sob. She has tried to reduce her caffeine intake. I have encouraged the patient to increase her salt and fluid intake. Allergies  Allergen Reactions  . Penicillins      Current Outpatient Prescriptions  Medication Sig Dispense Refill  . fluconazole (DIFLUCAN) 150 MG tablet Take 1 tablet (150 mg total) by mouth once.  2 tablet  1  . sertraline (ZOLOFT) 50 MG tablet Take 1 tablet (50 mg total) by mouth daily.  30 tablet  11  . topiramate (TOPAMAX) 25 MG tablet Take 1 tablet by mouth daily.      . ondansetron (ZOFRAN) 8 MG tablet Take by mouth every 8 (eight) hours as needed for nausea or vomiting.       No current facility-administered medications for this visit.     Past Medical History  Diagnosis Date  . GERD (gastroesophageal reflux disease)   . Hypertension     ROS:   All systems reviewed and negative except as noted in the HPI.   Past Surgical History  Procedure Laterality Date  . Cholecystectomy       Family History  Problem Relation Age of Onset  . Hyperlipidemia Mother   . Hypertension Mother   . Hyperlipidemia Father   . Hypertension Father   . Diabetes Father      History   Social History  . Marital Status: Married    Spouse Name: N/A    Number of Children: N/A  . Years of Education: N/A   Occupational History  . Not on file.   Social History Main Topics  . Smoking status: Never Smoker   . Smokeless tobacco: Never Used  . Alcohol Use: No  . Drug Use: No  . Sexual Activity: Not on file   Other Topics Concern  . Not on file   Social History Narrative  . No narrative on file     BP 118/78  Pulse 75  Ht 5\' 2"  (1.575 m)  Wt 160 lb (72.576 kg)  BMI 29.26 kg/m2   LMP 07/07/2013  Physical Exam:  Well appearing 33 yo woman, NAD HEENT: Unremarkable Neck:  No JVD, no thyromegally Back:  No CVA tenderness Lungs:  Clear with no wheezes, rales, or rhonchi HEART:  Regular rate rhythm, no murmurs, no rubs, no clicks Abd:  soft, positive bowel sounds, no organomegally, no rebound, no guarding Ext:  2 plus pulses, no edema, no cyanosis, no clubbing Skin:  No rashes no nodules Neuro:  CN II through XII intact, motor grossly intact   Assess/Plan:

## 2013-08-08 ENCOUNTER — Ambulatory Visit: Payer: BC Managed Care – PPO | Admitting: Internal Medicine

## 2013-08-16 ENCOUNTER — Ambulatory Visit (INDEPENDENT_AMBULATORY_CARE_PROVIDER_SITE_OTHER): Payer: BC Managed Care – PPO | Admitting: Family Medicine

## 2013-08-16 ENCOUNTER — Encounter: Payer: Self-pay | Admitting: Family Medicine

## 2013-08-16 VITALS — BP 120/81 | HR 81 | Temp 98.8°F | Ht 62.0 in | Wt 164.0 lb

## 2013-08-16 DIAGNOSIS — F411 Generalized anxiety disorder: Secondary | ICD-10-CM

## 2013-08-16 DIAGNOSIS — G43909 Migraine, unspecified, not intractable, without status migrainosus: Secondary | ICD-10-CM

## 2013-08-16 MED ORDER — ALPRAZOLAM 0.25 MG PO TABS: 0.2500 mg | ORAL_TABLET | Freq: Every day | ORAL | Status: DC

## 2013-08-16 MED ORDER — TOPIRAMATE 50 MG PO TABS: 50.0000 mg | ORAL_TABLET | Freq: Two times a day (BID) | ORAL | Status: DC

## 2013-08-16 NOTE — Progress Notes (Signed)
   Subjective:    Patient ID: Delton CoombesRegina D Coldren, female    DOB: Feb 20, 1981, 33 y.o.   MRN: [id number]  HPI This 33 y.o. female presents for evaluation of follow up on GAD and headaches. She is feeling better since seeing cardiology and she is not having panic problems as  Often.  She still gets occasional headache.   Review of Systems C/o panic and headache No chest pain, SOB, HA, dizziness, vision change, N/V, diarrhea, constipation, dysuria, urinary urgency or frequency, myalgias, arthralgias or rash.     Objective:   Physical Exam  Vital signs noted  Well developed well nourished female.  HEENT - Head atraumatic Normocephalic                Eyes - PERRLA, Conjuctiva - clear Sclera- Clear EOMI                Ears - EAC's Wnl TM's Wnl Gross Hearing WNL                Nose - Nares patent                 Throat - oropharanx wnl Respiratory - Lungs CTA bilateral Cardiac - RRR S1 and S2 without murmur GI - Abdomen soft Nontender and bowel sounds active x 4 Extremities - No edema. Neuro - Grossly intact.      Assessment & Plan:  Generalized anxiety disorder - Plan: ALPRAZolam (XANAX) 0.25 MG tablet  Migraine - Plan: topiramate (TOPAMAX) 50 MG tablet po bid and discussed starting with 50mg  po qhs for a week then going to bid  Vasovagal syncope - Doing better, follow up with cardiology. Deatra CanterWilliam J Oxford FNP

## 2013-08-29 ENCOUNTER — Ambulatory Visit: Payer: BC Managed Care – PPO | Admitting: Internal Medicine

## 2014-09-16 ENCOUNTER — Other Ambulatory Visit: Payer: Self-pay

## 2014-09-19 ENCOUNTER — Other Ambulatory Visit: Payer: Self-pay

## 2014-09-24 ENCOUNTER — Encounter: Payer: Self-pay | Admitting: Physician Assistant

## 2014-09-24 ENCOUNTER — Ambulatory Visit (INDEPENDENT_AMBULATORY_CARE_PROVIDER_SITE_OTHER): Payer: Self-pay | Admitting: Physician Assistant

## 2014-09-24 VITALS — BP 128/83 | HR 106 | Temp 98.6°F | Ht 62.0 in | Wt 160.8 lb

## 2014-09-24 DIAGNOSIS — H6503 Acute serous otitis media, bilateral: Secondary | ICD-10-CM

## 2014-09-24 MED ORDER — AZITHROMYCIN 250 MG PO TABS: ORAL_TABLET | ORAL | Status: DC

## 2014-09-24 NOTE — Progress Notes (Signed)
Subjective:     Patient ID: Kelly Jennings, female   DOB: 22-Aug-1980, 34 y.o.   MRN: [id number]  Sore Throat  Associated symptoms include congestion, coughing and ear pain.  Fever  Associated symptoms include congestion, coughing, ear pain and a sore throat. Pertinent negatives include no wheezing.     Review of Systems  Constitutional: Positive for fever, chills, activity change, appetite change and fatigue.  HENT: Positive for congestion, ear pain, postnasal drip, rhinorrhea, sinus pressure and sore throat.   Respiratory: Positive for cough. Negative for wheezing.   Cardiovascular: Negative.        Objective:   Physical Exam  Constitutional: She appears well-developed and well-nourished.  HENT:  Right Ear: External ear normal.  Left Ear: External ear normal.  Mouth/Throat: Oropharynx is clear and moist. No oropharyngeal exudate.  Bilat TM with erythema and loss of landmarks  Neck: Neck supple.  Cardiovascular: Normal rate, regular rhythm and normal heart sounds.   Pulmonary/Chest: Effort normal and breath sounds normal.  Lymphadenopathy:    She has no cervical adenopathy.  Nursing note and vitals reviewed.      Assessment:     Bilateral acute serous otitis media, recurrence not specified      Plan:     Fluids Rest OTC meds for sx ZPack due to PCN allergy Work note F/U prn PATP

## 2014-09-24 NOTE — Patient Instructions (Signed)

## 2015-05-26 ENCOUNTER — Ambulatory Visit (INDEPENDENT_AMBULATORY_CARE_PROVIDER_SITE_OTHER): Payer: 59 | Admitting: Family

## 2015-05-26 ENCOUNTER — Encounter: Payer: Self-pay | Admitting: Family

## 2015-05-26 VITALS — BP 139/99 | HR 69 | Temp 97.6°F | Ht 62.0 in | Wt 183.0 lb

## 2015-05-26 DIAGNOSIS — K2100 Gastro-esophageal reflux disease with esophagitis, without bleeding: Secondary | ICD-10-CM

## 2015-05-26 MED ORDER — OMEPRAZOLE 40 MG PO CPDR: 40.0000 mg | DELAYED_RELEASE_CAPSULE | Freq: Every day | ORAL | Status: DC

## 2015-05-26 NOTE — Patient Instructions (Signed)
Gastroesophageal Reflux Disease, Adult Normally, food travels down the esophagus and stays in the stomach to be digested. However, when a person has gastroesophageal reflux disease (GERD), food and stomach acid move back up into the esophagus. When this happens, the esophagus becomes sore and inflamed. Over time, GERD can create small holes (ulcers) in the lining of the esophagus.  CAUSES This condition is caused by a problem with the muscle between the esophagus and the stomach (lower esophageal sphincter, or LES). Normally, the LES muscle closes after food passes through the esophagus to the stomach. When the LES is weakened or abnormal, it does not close properly, and that allows food and stomach acid to go back up into the esophagus. The LES can be weakened by certain dietary substances, medicines, and medical conditions, including:  Tobacco use.  Pregnancy.  Having a hiatal hernia.  Heavy alcohol use.  Certain foods and beverages, such as coffee, chocolate, onions, and peppermint. RISK FACTORS This condition is more likely to develop in:  People who have an increased body weight.  People who have connective tissue disorders.  People who use NSAID medicines. SYMPTOMS Symptoms of this condition include:  Heartburn.  Difficult or painful swallowing.  The feeling of having a lump in the throat.  Abitter taste in the mouth.  Bad breath.  Having a large amount of saliva.  Having an upset or bloated stomach.  Belching.  Chest pain.  Shortness of breath or wheezing.  Ongoing (chronic) cough or a night-time cough.  Wearing away of tooth enamel.  Weight loss. Different conditions can cause chest pain. Make sure to see your health care provider if you experience chest pain. DIAGNOSIS Your health care provider will take a medical history and perform a physical exam. To determine if you have mild or severe GERD, your health care provider may also monitor how you respond  to treatment. You may also have other tests, including:  An endoscopy toexamine your stomach and esophagus with a small camera.  A test thatmeasures the acidity level in your esophagus.  A test thatmeasures how much pressure is on your esophagus.  A barium swallow or modified barium swallow to show the shape, size, and functioning of your esophagus. TREATMENT The goal of treatment is to help relieve your symptoms and to prevent complications. Treatment for this condition may vary depending on how severe your symptoms are. Your health care provider may recommend:  Changes to your diet.  Medicine.  Surgery. HOME CARE INSTRUCTIONS Diet  Follow a diet as recommended by your health care provider. This may involve avoiding foods and drinks such as:  Coffee and tea (with or without caffeine).  Drinks that containalcohol.  Energy drinks and sports drinks.  Carbonated drinks or sodas.  Chocolate and cocoa.  Peppermint and mint flavorings.  Garlic and onions.  Horseradish.  Spicy and acidic foods, including peppers, chili powder, curry powder, vinegar, hot sauces, and barbecue sauce.  Citrus fruit juices and citrus fruits, such as oranges, lemons, and limes.  Tomato-based foods, such as red sauce, chili, salsa, and pizza with red sauce.  Fried and fatty foods, such as donuts, french fries, potato chips, and high-fat dressings.  High-fat meats, such as hot dogs and fatty cuts of red and white meats, such as rib eye steak, sausage, ham, and bacon.  High-fat dairy items, such as whole milk, butter, and cream cheese.  Eat small, frequent meals instead of large meals.  Avoid drinking large amounts of liquid with your   meals.  Avoid eating meals during the 2-3 hours before bedtime.  Avoid lying down right after you eat.  Do not exercise right after you eat. General Instructions  Pay attention to any changes in your symptoms.  Take over-the-counter and prescription  medicines only as told by your health care provider. Do not take aspirin, ibuprofen, or other NSAIDs unless your health care provider told you to do so.  Do not use any tobacco products, including cigarettes, chewing tobacco, and e-cigarettes. If you need help quitting, ask your health care provider.  Wear loose-fitting clothing. Do not wear anything tight around your waist that causes pressure on your abdomen.  Raise (elevate) the head of your bed 6 inches (15cm).  Try to reduce your stress, such as with yoga or meditation. If you need help reducing stress, ask your health care provider.  If you are overweight, reduce your weight to an amount that is healthy for you. Ask your health care provider for guidance about a safe weight loss goal.  Keep all follow-up visits as told by your health care provider. This is important. SEEK MEDICAL CARE IF:  You have new symptoms.  You have unexplained weight loss.  You have difficulty swallowing, or it hurts to swallow.  You have wheezing or a persistent cough.  Your symptoms do not improve with treatment.  You have a hoarse voice. SEEK IMMEDIATE MEDICAL CARE IF:  You have pain in your arms, neck, jaw, teeth, or back.  You feel sweaty, dizzy, or light-headed.  You have chest pain or shortness of breath.  You vomit and your vomit looks like blood or coffee grounds.  You faint.  Your stool is bloody or black.  You cannot swallow, drink, or eat.   This information is not intended to replace advice given to you by your health care provider. Make sure you discuss any questions you have with your health care provider.   Document Released: 01/19/2005 Document Revised: 12/31/2014 Document Reviewed: 08/06/2014 Elsevier Interactive Patient Education 2016 [employer] Food Choices for Gastroesophageal Reflux Disease, Adult When you have gastroesophageal reflux disease (GERD), the foods you eat and your eating habits are very important.  Choosing the right foods can help ease the discomfort of GERD. WHAT GENERAL GUIDELINES DO I NEED TO FOLLOW?  Choose fruits, vegetables, whole grains, low-fat dairy products, and low-fat meat, fish, and poultry.  Limit fats such as oils, salad dressings, butter, nuts, and avocado.  Keep a food diary to identify foods that cause symptoms.  Avoid foods that cause reflux. These may be different for different people.  Eat frequent small meals instead of three large meals each day.  Eat your meals slowly, in a relaxed setting.  Limit fried foods.  Cook foods using methods other than frying.  Avoid drinking alcohol.  Avoid drinking large amounts of liquids with your meals.  Avoid bending over or lying down until 2-3 hours after eating. WHAT FOODS ARE NOT RECOMMENDED? The following are some foods and drinks that may worsen your symptoms: Vegetables Tomatoes. Tomato juice. Tomato and spaghetti sauce. Chili peppers. Onion and garlic. Horseradish. Fruits Oranges, grapefruit, and lemon (fruit and juice). Meats High-fat meats, fish, and poultry. This includes hot dogs, ribs, ham, sausage, salami, and bacon. Dairy Whole milk and chocolate milk. Sour cream. Cream. Butter. Ice cream. Cream cheese.  Beverages Coffee and tea, with or without caffeine. Carbonated beverages or energy drinks. Condiments Hot sauce. Barbecue sauce.  Sweets/Desserts Chocolate and cocoa. Donuts. Peppermint and spearmint.   Fats and Oils High-fat foods, including French fries and potato chips. Other Vinegar. Strong spices, such as black pepper, white pepper, red pepper, cayenne, curry powder, cloves, ginger, and chili powder. The items listed above may not be a complete list of foods and beverages to avoid. Contact your dietitian for more information.   This information is not intended to replace advice given to you by your health care provider. Make sure you discuss any questions you have with your health care  provider.   Document Released: 04/11/2005 Document Revised: 05/02/2014 Document Reviewed: 02/13/2013 Elsevier Interactive Patient Education 2016 [employer] Inc[employer]  

## 2015-05-26 NOTE — Progress Notes (Signed)
   Subjective:    Patient ID: Kelly Jennings, female    DOB: August 01, 1980, 35 y.o.   MRN: [id number]  Heartburn She complains of belching, dysphagia, heartburn and nausea. She reports no choking, no coughing or no sore throat. This is a chronic problem. The current episode started more than 1 month ago. The problem occurs frequently. The problem has been waxing and waning. The heartburn duration is several minutes. The heartburn wakes her from sleep. The heartburn limits her activity. The heartburn changes with position. The symptoms are aggravated by bending, certain foods and lying down. Risk factors include obesity. She has tried an antacid and a PPI for the symptoms. The treatment provided moderate relief.      Review of Systems  Constitutional: Negative.   HENT: Negative.  Negative for sore throat.   Eyes: Negative.   Respiratory: Negative.  Negative for cough, choking and shortness of breath.   Cardiovascular: Negative.  Negative for palpitations.  Gastrointestinal: Positive for heartburn, dysphagia and nausea.  Endocrine: Negative.   Genitourinary: Negative.   Musculoskeletal: Negative.   Neurological: Negative.  Negative for headaches.  Hematological: Negative.   Psychiatric/Behavioral: Negative.   All other systems reviewed and are negative.      Objective:   Physical Exam  Constitutional: She is oriented to person, place, and time. She appears well-developed and well-nourished. No distress.  HENT:  Head: Normocephalic and atraumatic.  Eyes: Pupils are equal, round, and reactive to light.  Neck: Normal range of motion. Neck supple. No thyromegaly present.  Cardiovascular: Normal rate, regular rhythm, normal heart sounds and intact distal pulses.   No murmur heard. Pulmonary/Chest: Effort normal and breath sounds normal. No respiratory distress. She has no wheezes.  Abdominal: Soft. Bowel sounds are normal. She exhibits no distension. There is no tenderness.    Musculoskeletal: Normal range of motion. She exhibits no edema or tenderness.  Neurological: She is alert and oriented to person, place, and time. She has normal reflexes. No cranial nerve deficit.  Skin: Skin is warm and dry.  Psychiatric: She has a normal mood and affect. Her behavior is normal. Judgment and thought content normal.  Vitals reviewed.     BP 139/99 mmHg  Pulse 69  Temp(Src) 97.6 F (36.4 C) (Oral)  Ht  (1.575 m)  Wt 183 lb (83.008 kg)  BMI 33.46 kg/m2     Assessment & Plan:  1. Gastroesophageal reflux disease with esophagitis -Pt's Prilosec increased to 40 mg from 20 mg daily -Diet discussed- Avoid spicy, fried, or citrus foods -Weight loss discussed -RTO prn  - omeprazole (PRILOSEC) 40 MG capsule; Take 1 capsule (40 mg total) by mouth daily.NEIDY GUERRIERI90 capsule; Refill: 1  Jannifer Rodney, FNP

## 2016-02-01 ENCOUNTER — Ambulatory Visit (INDEPENDENT_AMBULATORY_CARE_PROVIDER_SITE_OTHER): Payer: 59 | Admitting: Physician Assistant

## 2016-02-01 ENCOUNTER — Encounter: Payer: Self-pay | Admitting: Physician Assistant

## 2016-02-01 VITALS — BP 122/88 | HR 91 | Temp 97.9°F | Ht 62.0 in | Wt 165.8 lb

## 2016-02-01 DIAGNOSIS — Z01419 Encounter for gynecological examination (general) (routine) without abnormal findings: Secondary | ICD-10-CM

## 2016-02-01 DIAGNOSIS — Z124 Encounter for screening for malignant neoplasm of cervix: Secondary | ICD-10-CM

## 2016-02-01 DIAGNOSIS — Z0000 Encounter for general adult medical examination without abnormal findings: Secondary | ICD-10-CM

## 2016-02-01 DIAGNOSIS — Z0001 Encounter for general adult medical examination with abnormal findings: Secondary | ICD-10-CM

## 2016-02-01 MED ORDER — DESOGESTREL-ETHINYL ESTRADIOL 0.15-0.02/0.01 MG (21/5) PO TABS: 1.0000 | ORAL_TABLET | Freq: Every day | ORAL | 99 refills | Status: DC

## 2016-02-01 NOTE — Patient Instructions (Addendum)
Hormonal Contraception Information Estrogen and progesterone (progestin) are hormones used in many forms of birth control (contraception). These two hormones make up most hormonal contraceptives. Hormonal contraceptives use either:   A combination of estrogen hormone and progesterone hormone in one of these forms:  Pill--Pills come in various combinations of active hormone pills and nonhormonal pills. Different combinations of pills may give you a period once a month, once every 3 months, or no period at all. It is important to take the pills the same time each day.  Patch--The patch is placed on the lower abdomen every week for 3 weeks. On the fourth week, the patch is not placed.  Vaginal ring--The ring is placed in the vagina and left there for 3 weeks. It is then removed for 1 week.  Progesterone alone in one of these forms:  Pill--Hormone pills are taken every day of the cycle.  Intrauterine device (IUD)--The IUD is inserted during a menstrual period and removed or replaced every 5 years or sooner.  Implant--Plastic rods are placed under the skin of the upper arm. They are removed or replaced every 3 years or sooner.  Injection--The injection is given once every 90 days. Pregnancy can still occur with any of these hormonal contraceptive methods. If you have any suspicion that you might be pregnant, take a pregnancy test and talk to your health care provider.  ESTROGEN AND PROGESTERONE CONTRACEPTIVES Estrogen and progesterone contraceptives can prevent pregnancy by:  Stopping the release of an egg (ovulation).  Thickening the mucus of the cervix, making it difficult for sperm to enter the uterus.  Changing the lining of the uterus. This change makes it more difficult for an egg to implant. Side effects from estrogen occur more often in the first 2-3 months. Talk to your health care provider about what side effects may affect you. If you develop persistent side effects or they are  severe, talk to your health care provider. PROGESTERONE CONTRACEPTIVES Progesterone-only contraceptives can prevent pregnancy by:   Blocking ovulation. This occurs in many women, but some women will continue to ovulate.   Preventing the entry of sperm into the uterus by keeping the cervical mucus thick and sticky.   Changing the lining of the uterus. This change makes it more difficult for an egg to implant.  Side effects of progesterone can vary. Talk to your health care provider about what side effects may affect you. If you develop persistent side effects or they are severe, talk to your health care provider.    This information is not intended to replace advice given to you by your health care provider. Make sure you discuss any questions you have with your health care provider.   Document Released: 05/01/2007 Document Revised: 12/12/2012 Document Reviewed: 09/23/2012 Elsevier Interactive Patient Education 2016 [place] Maintenance, Female Adopting a healthy lifestyle and getting preventive care can go a long way to promote health and wellness. Talk with your health care provider about what schedule of regular examinations is right for you. This is a good chance for you to check in with your provider about disease prevention and staying healthy. In between checkups, there are plenty of things you can do on your own. Experts have done a lot of research about which lifestyle changes and preventive measures are most likely to keep you healthy. Ask your health care provider for more information. WEIGHT AND DIET  Eat a healthy diet  Be sure to include plenty of vegetables, fruits, low-fat dairy  products, and lean protein.  Do not eat a lot of foods high in solid fats, added sugars, or salt.  Get regular exercise. This is one of the most important things you can do for your health.  Most adults should exercise for at least 150 minutes each week. The exercise should  increase your heart rate and make you sweat (moderate-intensity exercise).  Most adults should also do strengthening exercises at least twice a week. This is in addition to the moderate-intensity exercise.  Maintain a healthy weight  Body mass index (BMI) is a measurement that can be used to identify possible weight problems. It estimates body fat based on height and weight. Your health care provider can help determine your BMI and help you achieve or maintain a healthy weight.  For females 3 years of age and older:   A BMI below 18.5 is considered underweight.  A BMI of 18.5 to 24.9 is normal.  A BMI of 25 to 29.9 is considered overweight.  A BMI of 30 and above is considered obese.  Watch levels of cholesterol and blood lipids  You should start having your blood tested for lipids and cholesterol at 36 years of age, then have this test every 5 years.  You may need to have your cholesterol levels checked more often if:  Your lipid or cholesterol levels are high.  You are older than 36 years of age.  You are at high risk for heart disease.  CANCER SCREENING   Lung Cancer  Lung cancer screening is recommended for adults 38-86 years old who are at high risk for lung cancer because of a history of smoking.  A yearly low-dose CT scan of the lungs is recommended for people who:  Currently smoke.  Have quit within the past 15 years.  Have at least a 30-pack-year history of smoking. A pack year is smoking an average of one pack of cigarettes a day for 1 year.  Yearly screening should continue until it has been 15 years since you quit.  Yearly screening should stop if you develop a health problem that would prevent you from having lung cancer treatment.  Breast Cancer  Practice breast self-awareness. This means understanding how your breasts normally appear and feel.  It also means doing regular breast self-exams. Let your health care provider know about any changes, no  matter how small.  If you are in your 20s or 30s, you should have a clinical breast exam (CBE) by a health care provider every 1-3 years as part of a regular health exam.  If you are 60 or older, have a CBE every year. Also consider having a breast X-ray (mammogram) every year.  If you have a family history of breast cancer, talk to your health care provider about genetic screening.  If you are at high risk for breast cancer, talk to your health care provider about having an MRI and a mammogram every year.  Breast cancer gene (BRCA) assessment is recommended for women who have family members with BRCA-related cancers. BRCA-related cancers include:  Breast.  Ovarian.  Tubal.  Peritoneal cancers.  Results of the assessment will determine the need for genetic counseling and BRCA1 and BRCA2 testing. Cervical Cancer Your health care provider may recommend that you be screened regularly for cancer of the pelvic organs (ovaries, uterus, and vagina). This screening involves a pelvic examination, including checking for microscopic changes to the surface of your cervix (Pap test). You may be encouraged to have this  screening done every 3 years, beginning at age 69.  For women ages 109-65, health care providers may recommend pelvic exams and Pap testing every 3 years, or they may recommend the Pap and pelvic exam, combined with testing for human papilloma virus (HPV), every 5 years. Some types of HPV increase your risk of cervical cancer. Testing for HPV may also be done on women of any age with unclear Pap test results.  Other health care providers may not recommend any screening for nonpregnant women who are considered low risk for pelvic cancer and who do not have symptoms. Ask your health care provider if a screening pelvic exam is right for you.  If you have had past treatment for cervical cancer or a condition that could lead to cancer, you need Pap tests and screening for cancer for at least  20 years after your treatment. If Pap tests have been discontinued, your risk factors (such as having a new sexual partner) need to be reassessed to determine if screening should resume. Some women have medical problems that increase the chance of getting cervical cancer. In these cases, your health care provider may recommend more frequent screening and Pap tests. Colorectal Cancer  This type of cancer can be detected and often prevented.  Routine colorectal cancer screening usually begins at 36 years of age and continues through 36 years of age.  Your health care provider may recommend screening at an earlier age if you have risk factors for colon cancer.  Your health care provider may also recommend using home test kits to check for hidden blood in the stool.  A small camera at the end of a tube can be used to examine your colon directly (sigmoidoscopy or colonoscopy). This is done to check for the earliest forms of colorectal cancer.  Routine screening usually begins at age 17.  Direct examination of the colon should be repeated every 5-10 years through 36 years of age. However, you may need to be screened more often if early forms of precancerous polyps or small growths are found. Skin Cancer  Check your skin from head to toe regularly.  Tell your health care provider about any new moles or changes in moles, especially if there is a change in a mole's shape or color.  Also tell your health care provider if you have a mole that is larger than the size of a pencil eraser.  Always use sunscreen. Apply sunscreen liberally and repeatedly throughout the day.  Protect yourself by wearing long sleeves, pants, a wide-brimmed hat, and sunglasses whenever you are outside. HEART DISEASE, DIABETES, AND HIGH BLOOD PRESSURE   High blood pressure causes heart disease and increases the risk of stroke. High blood pressure is more likely to develop in:  People who have blood pressure in the high end  of the normal range (130-139/85-89 mm Hg).  People who are overweight or obese.  People who are African American.  If you are 29-44 years of age, have your blood pressure checked every 3-5 years. If you are 47 years of age or older, have your blood pressure checked every year. You should have your blood pressure measured twice--once when you are at a hospital or clinic, and once when you are not at a hospital or clinic. Record the average of the two measurements. To check your blood pressure when you are not at a hospital or clinic, you can use:  An automated blood pressure machine at a pharmacy.  A home blood pressure  monitor.  If you are between 69 years and 76 years old, ask your health care provider if you should take aspirin to prevent strokes.  Have regular diabetes screenings. This involves taking a blood sample to check your fasting blood sugar level.  If you are at a normal weight and have a low risk for diabetes, have this test once every three years after 36 years of age.  If you are overweight and have a high risk for diabetes, consider being tested at a younger age or more often. PREVENTING INFECTION  Hepatitis B  If you have a higher risk for hepatitis B, you should be screened for this virus. You are considered at high risk for hepatitis B if:  You were born in a country where hepatitis B is common. Ask your health care provider which countries are considered high risk.  Your parents were born in a high-risk country, and you have not been immunized against hepatitis B (hepatitis B vaccine).  You have HIV or AIDS.  You use needles to inject street drugs.  You live with someone who has hepatitis B.  You have had sex with someone who has hepatitis B.  You get hemodialysis treatment.  You take certain medicines for conditions, including cancer, organ transplantation, and autoimmune conditions. Hepatitis C  Blood testing is recommended for:  Everyone born from  [birth year] through 1966.  Anyone with known risk factors for hepatitis C. Sexually transmitted infections (STIs)  You should be screened for sexually transmitted infections (STIs) including gonorrhea and chlamydia if:  You are sexually active and are younger than 35 years of age.  You are older than 36 years of age and your health care provider tells you that you are at risk for this type of infection.  Your sexual activity has changed since you were last screened and you are at an increased risk for chlamydia or gonorrhea. Ask your health care provider if you are at risk.  If you do not have HIV, but are at risk, it may be recommended that you take a prescription medicine daily to prevent HIV infection. This is called pre-exposure prophylaxis (PrEP). You are considered at risk if:  You are sexually active and do not regularly use condoms or know the HIV status of your partner(s).  You take drugs by injection.  You are sexually active with a partner who has HIV. Talk with your health care provider about whether you are at high risk of being infected with HIV. If you choose to begin PrEP, you should first be tested for HIV. You should then be tested every 3 months for as long as you are taking PrEP.  PREGNANCY   If you are premenopausal and you may become pregnant, ask your health care provider about preconception counseling.  If you may become pregnant, take 400 to 800 micrograms (mcg) of folic acid every day.  If you want to prevent pregnancy, talk to your health care provider about birth control (contraception). OSTEOPOROSIS AND MENOPAUSE   Osteoporosis is a disease in which the bones lose minerals and strength with aging. This can result in serious bone fractures. Your risk for osteoporosis can be identified using a bone density scan.  If you are 60 years of age or older, or if you are at risk for osteoporosis and fractures, ask your health care provider if you should be screened.  Ask  your health care provider whether you should take a calcium or vitamin D supplement to lower your  risk for osteoporosis.  Menopause may have certain physical symptoms and risks.  Hormone replacement therapy may reduce some of these symptoms and risks. Talk to your health care provider about whether hormone replacement therapy is right for you.  HOME CARE INSTRUCTIONS   Schedule regular health, dental, and eye exams.  Stay current with your immunizations.   Do not use any tobacco products including cigarettes, chewing tobacco, or electronic cigarettes.  If you are pregnant, do not drink alcohol.  If you are breastfeeding, limit how much and how often you drink alcohol.  Limit alcohol intake to no more than 1 drink per day for nonpregnant women. One drink equals 12 ounces of beer, 5 ounces of wine, or 1 ounces of hard liquor.  Do not use street drugs.  Do not share needles.  Ask your health care provider for help if you need support or information about quitting drugs.  Tell your health care provider if you often feel depressed.  Tell your health care provider if you have ever been abused or do not feel safe at home.   This information is not intended to replace advice given to you by your health care provider. Make sure you discuss any questions you have with your health care provider.   Document Released: 10/25/2010 Document Revised: 05/02/2014 Document Reviewed: 03/13/2013 Elsevier Interactive Patient Education [employer].

## 2016-02-02 NOTE — Progress Notes (Signed)
     Patient ID: Kelly Jennings, female   DOB: 09-08-1980, 35 y.o.   MRN: [id number]   Subjective:     Kelly Jennings is a 35 y.o. female and is here for a comprehensive physical exam. The patient reports problems - need for birth control.  Social History   Social History  . Marital status: Married    Spouse name: N/A  . Number of children: N/A  . Years of education: N/A   Occupational History  . Not on file.   Social History Main Topics  . Smoking status: Never Smoker  . Smokeless tobacco: Never Used  . Alcohol use No  . Drug use: No  . Sexual activity: Not on file   Other Topics Concern  . Not on file   Social History Narrative  . No narrative on file   Health Maintenance  Topic Date Due  . HIV Screening  12/08/1994  . TETANUS/TDAP  12/08/1998  . INFLUENZA VACCINE  07/23/2016 (Originally 11/24/2015)  . PAP SMEAR  04/01/2016    The following portions of the patient's history were reviewed and updated as appropriate: allergies, current medications, past family history, past medical history, past social history, past surgical history and problem list.  Review of Systems Pertinent items noted in HPI and remainder of comprehensive ROS otherwise negative.   Objective:    General appearance: alert, cooperative, appears stated age and no distress Head: Normocephalic, without obvious abnormality, atraumatic Eyes: conjunctivae/corneas clear. PERRL, EOM's intact. Fundi benign. Ears: normal TM's and external ear canals both ears Nose: Nares normal. Septum midline. Mucosa normal. No drainage or sinus tenderness. Throat: lips, mucosa, and tongue normal; teeth and gums normal Neck: no adenopathy, no carotid bruit, no JVD, supple, symmetrical, trachea midline and thyroid not enlarged, symmetric, no tenderness/mass/nodules Lungs: clear to auscultation bilaterally Breasts: normal appearance, no masses or tenderness, positive findings: fibrocystic changes Heart: regular rate  and rhythm, S1, S2 normal, no murmur, click, rub or gallop Abdomen: soft, non-tender; bowel sounds normal; no masses,  no organomegaly Pelvic: cervix normal in appearance, external genitalia normal, no adnexal masses or tenderness, no cervical motion tenderness, rectovaginal septum normal, uterus normal size, shape, and consistency and vagina normal without discharge Extremities: extremities normal, atraumatic, no cyanosis or edema Skin: Skin color, texture, turgor normal. No rashes or lesions Neurologic: Alert and oriented X 3, normal strength and tone. Normal symmetric reflexes. Normal coordination and gait    Assessment:    Healthy female exam.       Plan:  1. Encounter for wellness examination in adult - IGP, Aptima HPV, rfx 16/18,45  2. Screening for malignant neoplasm of cervix  3. Encounter for gynecological examination without abnormal finding Start mircette birth control at this time    See After Visit Summary for Counseling Recommendations

## 2016-02-04 LAB — IGP, APTIMA HPV, RFX 16/18,45
HPV APTIMA: NEGATIVE
PAP Smear Comment: 0

## 2016-03-21 ENCOUNTER — Telehealth: Payer: Self-pay | Admitting: Family Medicine

## 2016-03-23 NOTE — Telephone Encounter (Signed)
Patient states that she spots everyday. She states that this has been going on for about 4 months. She has been on the birth control for 2 months. She states that the flow slows down at night but starts back once she gets up and starts moving around.

## 2016-03-23 NOTE — Telephone Encounter (Signed)
Patient aware, must schedule an appointment with provider for the referral to be done. She plans to schedule later.

## 2016-03-24 MED ORDER — MEDROXYPROGESTERONE ACETATE 10 MG PO TABS: 10.0000 mg | ORAL_TABLET | Freq: Every day | ORAL | 0 refills | Status: DC

## 2016-03-24 NOTE — Telephone Encounter (Signed)
Left message for patient to call.

## 2016-03-24 NOTE — Telephone Encounter (Signed)
Stop the oral birth control, use barrier methods for birth control.  Appointment in next 10 days or so. Call in provera 10 mg one daily for 10 days.

## 2016-03-24 NOTE — Telephone Encounter (Signed)
Pt notified of recommendation  RX sent into [facility] per Prudy FeelerAngel Jones appt scheduled

## 2016-03-30 ENCOUNTER — Encounter: Payer: Self-pay | Admitting: Physician Assistant

## 2016-03-30 ENCOUNTER — Ambulatory Visit (INDEPENDENT_AMBULATORY_CARE_PROVIDER_SITE_OTHER): Payer: 59 | Admitting: Physician Assistant

## 2016-03-30 VITALS — BP 125/83 | HR 87 | Temp 98.9°F | Ht 62.0 in | Wt 169.8 lb

## 2016-03-30 DIAGNOSIS — N939 Abnormal uterine and vaginal bleeding, unspecified: Secondary | ICD-10-CM

## 2016-03-30 DIAGNOSIS — J0100 Acute maxillary sinusitis, unspecified: Secondary | ICD-10-CM

## 2016-03-30 MED ORDER — NORETHINDRONE-MESTRANOL 1-50 MG-MCG PO TABS: 1.0000 | ORAL_TABLET | Freq: Every day | ORAL | 11 refills | Status: DC

## 2016-03-30 MED ORDER — AZITHROMYCIN 250 MG PO TABS: ORAL_TABLET | ORAL | 0 refills | Status: DC

## 2016-03-30 NOTE — Patient Instructions (Signed)
Dysmenorrhea °Menstrual cramps (dysmenorrhea) are caused by the muscles of the uterus tightening (contracting) during a menstrual period. For some women, this discomfort is merely bothersome. For others, dysmenorrhea can be severe enough to interfere with everyday activities for a few days each month. °Primary dysmenorrhea is menstrual cramps that last a couple of days when you start having menstrual periods or soon after. This often begins after a teenager starts having her period. As a woman gets older or has a baby, the cramps will usually lessen or disappear. Secondary dysmenorrhea begins later in life, lasts longer, and the pain may be stronger than primary dysmenorrhea. The pain may start before the period and last a few days after the period. °What are the causes? °Dysmenorrhea is usually caused by an underlying problem, such as: °· The tissue lining the uterus grows outside of the uterus in other areas of the body (endometriosis). °· The endometrial tissue, which normally lines the uterus, is found in or grows into the muscular walls of the uterus (adenomyosis). °· The pelvic blood vessels are engorged with blood just before the menstrual period (pelvic congestive syndrome). °· Overgrowth of cells (polyps) in the lining of the uterus or cervix. °· Falling down of the uterus (prolapse) because of loose or stretched ligaments. °· Depression. °· Bladder problems, infection, or inflammation. °· Problems with the intestine, a tumor, or irritable bowel syndrome. °· Cancer of the female organs or bladder. °· A severely tipped uterus. °· A very tight opening or closed cervix. °· Noncancerous tumors of the uterus (fibroids). °· Pelvic inflammatory disease (PID). °· Pelvic scarring (adhesions) from a previous surgery. °· Ovarian cyst. °· An intrauterine device (IUD) used for birth control. °What increases the risk? °You may be at greater risk of dysmenorrhea if: °· You are younger than age 30. °· You started puberty  early. °· You have irregular or heavy bleeding. °· You have never given birth. °· You have a family history of this problem. °· You are a smoker. °What are the signs or symptoms? °· Cramping or throbbing pain in your lower abdomen. °· Headaches. °· Lower back pain. °· Nausea or vomiting. °· Diarrhea. °· Sweating or dizziness. °· Loose stools. °How is this diagnosed? °A diagnosis is based on your history, symptoms, physical exam, diagnostic tests, or procedures. Diagnostic tests or procedures may include: °· Blood tests. °· Ultrasonography. °· An examination of the lining of the uterus (dilation and curettage, D&C). °· An examination inside your abdomen or pelvis with a scope (laparoscopy). °· X-rays. °· CT scan. °· MRI. °· An examination inside the bladder with a scope (cystoscopy). °· An examination inside the intestine or stomach with a scope (colonoscopy, gastroscopy). °How is this treated? °Treatment depends on the cause of the dysmenorrhea. Treatment may include: °· Pain medicine prescribed by your health care provider. °· Birth control pills or an IUD with progesterone hormone in it. °· Hormone replacement therapy. °· Nonsteroidal anti-inflammatory drugs (NSAIDs). These may help stop the production of prostaglandins. °· Surgery to remove adhesions, endometriosis, ovarian cyst, or fibroids. °· Removal of the uterus (hysterectomy). °· Progesterone shots to stop the menstrual period. °· Cutting the nerves on the sacrum that go to the female organs (presacral neurectomy). °· Electric current to the sacral nerves (sacral nerve stimulation). °· Antidepressant medicine. °· Psychiatric therapy, counseling, or group therapy. °· Exercise and physical therapy. °· Meditation and yoga therapy. °· Acupuncture. °Follow these instructions at home: °· Only take over-the-counter or prescription medicines as directed   by your health care provider. °· Place a heating pad or hot water bottle on your lower back or abdomen. Do not  sleep with the heating pad. °· Use aerobic exercises, walking, swimming, biking, and other exercises to help lessen the cramping. °· Massage to the lower back or abdomen may help. °· Stop smoking. °· Avoid alcohol and caffeine. °Contact a health care provider if: °· Your pain does not get better with medicine. °· You have pain with sexual intercourse. °· Your pain increases and is not controlled with medicines. °· You have abnormal vaginal bleeding with your period. °· You develop nausea or vomiting with your period that is not controlled with medicine. °Get help right away if: °You pass out. °This information is not intended to replace advice given to you by your health care provider. Make sure you discuss any questions you have with your health care provider. °Document Released: 04/11/2005 Document Revised: 09/17/2015 Document Reviewed: 09/27/2012 °Elsevier Interactive Patient Education © 2017 [employer] ° °

## 2016-03-31 NOTE — Progress Notes (Signed)
BP 125/83   Pulse 87   Temp 98.9 F (37.2 C) (Oral)   Ht 5\' 2"  (1.575 m)   Wt 169 lb 12.8 oz (77 kg)   BMI 31.06 kg/m    Subjective:    Patient ID: Kelly Jennings, female    DOB: 03/20/1981, 35 y.o.   MRN: [id number]  HPI: Kelly Jennings is a 35 y.o. female presenting on 03/30/2016 for Vaginal Bleeding (x 4 months ) and Head stopped up  She has continued having irregular cycles even on a low-dose birth control pill we have discussed that we will increase the strength of her birth control pills don't take any type of Depo-Provera injection. She is concerned about weight gain. At this point she is taking 10 days of Provera to control her cycle and we will start her birth control pill. She is also having sinus pressure and congestion. She denies any fever or chills. No nausea vomiting or diarrhea.  Relevant past medical, surgical, family and social history reviewed and updated as indicated. Allergies and medications reviewed and updated.  Past Medical History:  Diagnosis Date  . GERD (gastroesophageal reflux disease)   . Hypertension   . Migraines     Past Surgical History:  Procedure Laterality Date  . CHOLECYSTECTOMY      Review of Systems  Constitutional: Positive for chills and fatigue. Negative for activity change and appetite change.  HENT: Positive for congestion, postnasal drip and sore throat.   Eyes: Negative.   Respiratory: Positive for cough and wheezing.   Cardiovascular: Negative.  Negative for chest pain, palpitations and leg swelling.  Gastrointestinal: Negative.   Genitourinary: Negative.   Musculoskeletal: Negative.   Skin: Negative.   Neurological: Positive for headaches.      Medication List       Accurate as of 03/30/16 11:59 PM. Always use your most recent med list.          azithromycin 250 MG tablet Commonly known as:  ZITHROMAX Z-PAK As directed   desogestrel-ethinyl estradiol 0.15-0.02/0.01 MG (21/5) tablet Commonly known as:   KARIVA,AZURETTE,MIRCETTE Take 1 tablet by mouth daily.   medroxyPROGESTERone 10 MG tablet Commonly known as:  PROVERA Take 1 tablet (10 mg total) by mouth daily.   Norethindrone-Mestranol 1-50 MG-MCG tablet Commonly known as:  NECON Take 1 tablet by mouth daily.   phentermine 37.5 MG tablet Commonly known as:  ADIPEX-P Take 1 tablet by mouth before breakfast   topiramate 25 MG tablet Commonly known as:  TOPAMAX Take 25 mg by mouth daily.   triamterene-hydrochlorothiazide 37.5-25 MG tablet Commonly known as:  MAXZIDE-25 Take 1 tablet by mouth daily.          Objective:    BP 125/83   Pulse 87   Temp 98.9 F (37.2 C) (Oral)   Ht 5\' 2"  (1.575 m)   Wt 169 lb 12.8 oz (77 kg)   BMI 31.06 kg/m   Allergies  Allergen Reactions  . Penicillins     Physical Exam  Constitutional: She is oriented to person, place, and time. She appears well-developed and well-nourished.  HENT:  Head: Normocephalic and atraumatic.  Right Ear: A middle ear effusion is present.  Left Ear: A middle ear effusion is present.  Nose: Mucosal edema present. Right sinus exhibits no frontal sinus tenderness. Left sinus exhibits no frontal sinus tenderness.  Mouth/Throat: Posterior oropharyngeal erythema present. No oropharyngeal exudate or tonsillar abscesses.  Eyes: Conjunctivae and EOM are normal. Pupils are equal, round,  and reactive to light.  Neck: Normal range of motion.  Cardiovascular: Normal rate, regular rhythm, normal heart sounds and intact distal pulses.   Pulmonary/Chest: Effort normal and breath sounds normal.  Abdominal: Soft. Bowel sounds are normal.  Neurological: She is alert and oriented to person, place, and time. She has normal reflexes.  Skin: Skin is warm and dry. No rash noted.  Psychiatric: She has a normal mood and affect. Her behavior is normal. Judgment and thought content normal.  Nursing note and vitals reviewed.       Assessment & Plan:   1. Abnormal uterine  bleeding - Norethindrone-Mestranol (NECON) 1-50 MG-MCG tablet; Take 1 tablet by mouth daily.  Dispense: 1 Package; Refill: 11  2. Acute non-recurrent maxillary sinusitis - azithromycin (ZITHROMAX Z-PAK) 250 MG tablet; As directed  Dispense: 6 tablet; Refill: 0   Continue all other maintenance medications as listed above.  Follow up plan: Return if symptoms worsen or fail to improve.  No orders of the defined types were placed in this encounter.   Educational handout given for dysmenorrhea  Remus LofflerAngel S. Clois Montavon PA-C [facility] [address]  [place], [place] [id number] [phone]   03/31/2016, 10:19 PM

## 2016-04-03 ENCOUNTER — Encounter: Payer: Self-pay | Admitting: Physician Assistant

## 2016-04-04 MED ORDER — NORETHINDRONE-ETH ESTRADIOL 1-35 MG-MCG PO TABS: 1.0000 | ORAL_TABLET | Freq: Every day | ORAL | 11 refills | Status: DC

## 2016-07-28 ENCOUNTER — Telehealth: Payer: Self-pay | Admitting: Family Medicine

## 2016-07-28 MED ORDER — TRIAMTERENE-HCTZ 37.5-25 MG PO TABS: 1.0000 | ORAL_TABLET | Freq: Every day | ORAL | 0 refills | Status: DC

## 2016-07-28 NOTE — Telephone Encounter (Signed)
Covering for PCP  Refilled X 1, recommend follow up within 3-4 weeks.   Murtis Sink, MD [facility] 07/28/2016, 4:24 PM

## 2016-07-28 NOTE — Telephone Encounter (Signed)
Pt states she stopped taking her BP medicine about 6 months ago because her BP was fine. She now says her BP is 143/101 and wants to start taking it again and needs a rx sent to the pharmacy. Please advise.

## 2016-07-28 NOTE — Telephone Encounter (Signed)
Spoke to pt and and advised 1 refill sent in and to be seen in 3-4 weeks for BP check. Pt voiced understanding and I offered to schedule the appt for her but she said she would need to call back after looking at her schedule.

## 2016-10-06 ENCOUNTER — Encounter: Payer: Self-pay | Admitting: Physician Assistant

## 2016-10-06 ENCOUNTER — Ambulatory Visit (INDEPENDENT_AMBULATORY_CARE_PROVIDER_SITE_OTHER): Payer: 59 | Admitting: Physician Assistant

## 2016-10-06 VITALS — BP 141/95 | HR 72 | Temp 97.2°F | Ht 62.0 in | Wt 177.0 lb

## 2016-10-06 DIAGNOSIS — I10 Essential (primary) hypertension: Secondary | ICD-10-CM | POA: Diagnosis not present

## 2016-10-06 DIAGNOSIS — J0110 Acute frontal sinusitis, unspecified: Secondary | ICD-10-CM | POA: Insufficient documentation

## 2016-10-06 DIAGNOSIS — J0100 Acute maxillary sinusitis, unspecified: Secondary | ICD-10-CM | POA: Diagnosis not present

## 2016-10-06 MED ORDER — TRIAMTERENE-HCTZ 37.5-25 MG PO TABS: 1.0000 | ORAL_TABLET | Freq: Every day | ORAL | 5 refills | Status: DC

## 2016-10-06 MED ORDER — AZITHROMYCIN 250 MG PO TABS: ORAL_TABLET | ORAL | 0 refills | Status: DC

## 2016-10-06 MED ORDER — HYDROCODONE-HOMATROPINE 5-1.5 MG/5ML PO SYRP: 5.0000 mL | ORAL_SOLUTION | Freq: Four times a day (QID) | ORAL | 0 refills | Status: DC | PRN

## 2016-10-06 NOTE — Patient Instructions (Signed)
In a few days you may receive a survey in the mail or online from [employer] regarding your visit with us today. Please take a moment to fill this out. Your feedback is very important to our whole office. It can help us better understand your needs as well as improve your experience and satisfaction. Thank you for taking your time to complete it. We care about you.  Rande Roylance, PA-C  

## 2016-10-09 NOTE — Progress Notes (Signed)
BP (!) 141/95   Pulse 72   Temp 97.2 F (36.2 C) (Oral)   Ht 5\' 2"  (1.575 m)   Wt 177 lb (80.3 kg)   BMI 32.37 kg/m    Subjective:    Patient ID: Kelly CoombesRegina D Varano, female    DOB: [birth year], 36 y.o.   MRN: [id number]  HPI: Kelly Jennings is a 36 y.o. female presenting on 10/06/2016 for scratchy throat; Ear Pain; and refill BP meds  This patient has had many days of sinus headache and postnasal drainage. There is copious drainage at times. Denies any fever at this time. There has been a history of sinus infections in the past.  No history of sinus surgery. There is cough at night. It has become more prevalent in recent days.   Relevant past medical, surgical, family and social history reviewed and updated as indicated. Allergies and medications reviewed and updated.  Past Medical History:  Diagnosis Date  . GERD (gastroesophageal reflux disease)   . Hypertension   . Migraines     Past Surgical History:  Procedure Laterality Date  . CHOLECYSTECTOMY      Review of Systems  Constitutional: Positive for chills and fatigue. Negative for activity change, appetite change and fever.  HENT: Positive for congestion, postnasal drip, sinus pain, sinus pressure and sore throat.   Eyes: Negative.   Respiratory: Positive for cough. Negative for shortness of breath and wheezing.   Cardiovascular: Negative.  Negative for chest pain, palpitations and leg swelling.  Gastrointestinal: Negative.   Genitourinary: Negative.   Musculoskeletal: Negative.   Skin: Negative.   Neurological: Positive for headaches.    Allergies as of 10/06/2016      Reactions   Penicillins       Medication List       Accurate as of 10/06/16 11:59 PM. Always use your most recent med list.          azithromycin 250 MG tablet Commonly known as:  ZITHROMAX Z-PAK As directed   HYDROcodone-homatropine 5-1.5 MG/5ML syrup Commonly known as:  HYCODAN Take 5-10 mLs by mouth every 6 (six) hours as needed.     norethindrone-ethinyl estradiol 1/35 tablet Commonly known as:  NECON 1/35 (28) Take 1 tablet by mouth daily.   phentermine 37.5 MG tablet Commonly known as:  ADIPEX-P Take 1 tablet by mouth before breakfast   topiramate 25 MG tablet Commonly known as:  TOPAMAX Take 25 mg by mouth daily.   triamterene-hydrochlorothiazide 37.5-25 MG tablet Commonly known as:  MAXZIDE-25 Take 1 tablet by mouth daily.          Objective:    BP (!) 141/95   Pulse 72   Temp 97.2 F (36.2 C) (Oral)   Ht 5\' 2"  (1.575 m)   Wt 177 lb (80.3 kg)   BMI 32.37 kg/m   Allergies  Allergen Reactions  . Penicillins     Physical Exam  Constitutional: She is oriented to person, place, and time. She appears well-developed and well-nourished.  HENT:  Head: Normocephalic and atraumatic.  Right Ear: Tympanic membrane and external ear normal. No middle ear effusion.  Left Ear: Tympanic membrane and external ear normal.  No middle ear effusion.  Nose: Mucosal edema and rhinorrhea present. Right sinus exhibits no maxillary sinus tenderness. Left sinus exhibits no maxillary sinus tenderness.  Mouth/Throat: Uvula is midline. Posterior oropharyngeal erythema present.  Eyes: Conjunctivae and EOM are normal. Pupils are equal, round, and reactive to light. Right eye exhibits no discharge.  Left eye exhibits no discharge.  Neck: Normal range of motion.  Cardiovascular: Normal rate, regular rhythm and normal heart sounds.   Pulmonary/Chest: Effort normal and breath sounds normal. No respiratory distress. She has no wheezes.  Abdominal: Soft.  Lymphadenopathy:    She has no cervical adenopathy.  Neurological: She is alert and oriented to person, place, and time.  Skin: Skin is warm and dry.  Psychiatric: She has a normal mood and affect.  Nursing note and vitals reviewed.       Assessment & Plan:   1. Acute non-recurrent frontal sinusitis - azithromycin (ZITHROMAX Z-PAK) 250 MG tablet; As directed   Dispense: 6 tablet; Refill: 0 - HYDROcodone-homatropine (HYCODAN) 5-1.5 MG/5ML syrup; Take 5-10 mLs by mouth every 6 (six) hours as needed.  Dispense: 240 mL; Refill: 0  2. Acute non-recurrent maxillary sinusitis - azithromycin (ZITHROMAX Z-PAK) 250 MG tablet; As directed  Dispense: 6 tablet; Refill: 0 - HYDROcodone-homatropine (HYCODAN) 5-1.5 MG/5ML syrup; Take 5-10 mLs by mouth every 6 (six) hours as needed.  Dispense: 240 mL; Refill: 0  3. Essential hypertension - triamterene-hydrochlorothiazide (MAXZIDE-25) 37.5-25 MG tablet; Take 1 tablet by mouth daily.  Dispense: 30 tablet; Refill: 5   Current Outpatient Prescriptions:  .  norethindrone-ethinyl estradiol 1/35 (NECON 1/35, 28,) tablet, Take 1 tablet by mouth daily., Disp: 1 Package, Rfl: 11 .  phentermine (ADIPEX-P) 37.5 MG tablet, Take 1 tablet by mouth before breakfast, Disp: , Rfl: 0 .  topiramate (TOPAMAX) 25 MG tablet, Take 25 mg by mouth daily., Disp: , Rfl: 0 .  triamterene-hydrochlorothiazide (MAXZIDE-25) 37.5-25 MG tablet, Take 1 tablet by mouth daily., Disp: 30 tablet, Rfl: 5 .  azithromycin (ZITHROMAX Z-PAK) 250 MG tablet, As directed, Disp: 6 tablet, Rfl: 0 .  HYDROcodone-homatropine (HYCODAN) 5-1.5 MG/5ML syrup, Take 5-10 mLs by mouth every 6 (six) hours as needed., Disp: 240 mL, Rfl: 0  Continue all other maintenance medications as listed above.  Follow up plan: No Follow-up on file.  Educational handout given for sinusitis  Remus Loffler PA-C [facility] [address]  [place], [place] [address] [phone]   10/09/2016, 10:33 PM

## 2016-12-21 ENCOUNTER — Ambulatory Visit (INDEPENDENT_AMBULATORY_CARE_PROVIDER_SITE_OTHER): Payer: 59 | Admitting: Family Medicine

## 2016-12-21 ENCOUNTER — Encounter: Payer: Self-pay | Admitting: Family Medicine

## 2016-12-21 VITALS — BP 141/102 | HR 80 | Temp 98.5°F | Ht 62.0 in | Wt 185.0 lb

## 2016-12-21 DIAGNOSIS — H66002 Acute suppurative otitis media without spontaneous rupture of ear drum, left ear: Secondary | ICD-10-CM | POA: Diagnosis not present

## 2016-12-21 MED ORDER — AZITHROMYCIN 250 MG PO TABS: ORAL_TABLET | ORAL | 0 refills | Status: DC

## 2016-12-21 NOTE — Progress Notes (Signed)
Chief Complaint  Patient presents with  . sinus drainage  . ear pressure    HPI  Patient presents today for Symptoms include congestion,earache, nasal congestion, non productive cough, and sinus pressure. There is no fever, chills, or sweats. Onset of symptoms was  3 days ago, gradually worsening since that time.    PMH: Smoking status noted ROS: Per HPI  Objective: BP (!) 141/102   Pulse 80   Temp 98.5 F (36.9 C) (Oral)   Ht 5\' 2"  (1.575 m)   Wt 185 lb (83.9 kg)   BMI 33.84 kg/m  Gen: NAD, alert, cooperative with exam HEENT: NCAT, EOMI, PERRLLeft TM has air-fluid level. The right is clear. Resp: CTABL, no wheezes, non-labored Ext: No edema, warm Neuro: Alert and oriented, No gross deficits  Assessment and plan:  1. Acute suppurative otitis media of left ear without spontaneous rupture of tympanic membrane, recurrence not specified     Meds ordered this encounter  Medications  . azithromycin (ZITHROMAX Z-PAK) 250 MG tablet    Sig: As directed    Dispense:  1 each    Refill:  0      Follow up as needed.  Mechele ClaudeWarren Lieutenant Abarca, MD

## 2017-01-10 ENCOUNTER — Ambulatory Visit (INDEPENDENT_AMBULATORY_CARE_PROVIDER_SITE_OTHER): Payer: 59 | Admitting: Family

## 2017-01-10 ENCOUNTER — Encounter: Payer: Self-pay | Admitting: Family

## 2017-01-10 VITALS — BP 144/95 | HR 82 | Temp 98.9°F | Ht 62.0 in | Wt 186.0 lb

## 2017-01-10 DIAGNOSIS — I10 Essential (primary) hypertension: Secondary | ICD-10-CM

## 2017-01-10 DIAGNOSIS — S86912A Strain of unspecified muscle(s) and tendon(s) at lower leg level, left leg, initial encounter: Secondary | ICD-10-CM | POA: Diagnosis not present

## 2017-01-10 MED ORDER — NAPROXEN 500 MG PO TABS: 500.0000 mg | ORAL_TABLET | Freq: Two times a day (BID) | ORAL | 1 refills | Status: DC

## 2017-01-10 MED ORDER — PREDNISONE 10 MG (21) PO TBPK: ORAL_TABLET | ORAL | 0 refills | Status: DC

## 2017-01-10 NOTE — Progress Notes (Signed)
   Subjective:    Patient ID: Kelly Jennings, female    DOB: 11-Nov-1980, 36 y.o.   MRN: [id number]  Knee Pain   The incident occurred more than 1 week ago. Incident location: stretching. There was no injury mechanism (stretching). The pain is present in the left knee. The quality of the pain is described as aching. The pain is at a severity of 8/10. The pain is mild. The pain has been worsening since onset. Pertinent negatives include no inability to bear weight or loss of motion. She reports no foreign bodies present. The symptoms are aggravated by weight bearing. She has tried rest and NSAIDs for the symptoms. The treatment provided mild relief.      Review of Systems  All other systems reviewed and are negative.      Objective:   Physical Exam  Constitutional: She is oriented to person, place, and time. She appears well-developed and well-nourished. No distress.  HENT:  Head: Normocephalic.  Cardiovascular: Normal rate, regular rhythm, normal heart sounds and intact distal pulses.   No murmur heard. Pulmonary/Chest: Effort normal and breath sounds normal. No respiratory distress. She has no wheezes.  Abdominal: Soft. Bowel sounds are normal. She exhibits no distension. There is no tenderness.  Musculoskeletal: Normal range of motion. She exhibits tenderness. She exhibits no edema.  Mild left knee pain with hyperflexion  Neurological: She is alert and oriented to person, place, and time. She has normal reflexes. No cranial nerve deficit.  Skin: Skin is warm and dry.  Psychiatric: She has a normal mood and affect. Her behavior is normal. Judgment and thought content normal.  Vitals reviewed.    BP (!) 144/95   Pulse 82   Temp 98.9 F (37.2 C) (Oral)   Ht  (1.575 m)   Wt 186 lb (84.4 kg)   BMI 34.02 kg/m      Assessment & Plan:  1. Strain of left knee, initial encounter Rest Ice  ROM exercises RTO prn - predniSONE (STERAPRED UNI-PAK 21 TAB) 10 MG (21) TBPK  tablet; Use as directed  Dispense: 21 tablet; Refill: 0 - naproxen (NAPROSYN) 500 MG tablet; Take 1 tablet (500 mg total) by mouth 2 (two) times daily with a meal.  Dispense: 60 tablet; Refill: 1  2. Essential hypertension Continue medication Discussed that we may need to add an agent, but pt is in pain today Pt to schedule pap/CPE and will recheck at that time   Jannifer Rodney, FNP     Jannifer Rodney, FNP

## 2017-01-10 NOTE — Patient Instructions (Signed)
Knee Sprain, Adult A knee sprain is a stretch or tear in a knee ligament. Knee ligaments are bands of tissue that connect bones in the knee to each other. What are the causes? This condition often results from:  A fall.  An injury to the knee.  What are the signs or symptoms? Symptoms of this condition include:  Trouble bending the leg.  Swelling in the knee.  Bruising around the knee.  Tenderness or pain in the knee.  Muscle spasms around the knee.  How is this diagnosed? This condition may be diagnosed based on:  A physical exam.  What happened just before you started to have symptoms.  Tests, including: ? An X-ray. This may be done to make sure no bones are broken. ? An MRI. This may be done to check if the ligament is torn. ? Stress testing of the knee. This may be done to check ligament damage.  How is this treated? Treatment for this condition may involve:  Keeping the knee still (immobilized) with a cast, brace, or splint.  Applying ice to the knee. This helps with pain and swelling.  Keeping the knee raised (elevated) above the level of your heart when you are resting. This helps with pain and swelling.  Taking medicine for pain.  Exercises to prevent or limit permanent weakness or stiffness in your knee.  Surgery to reconnect the ligament to the bone or to reconstruct it. This may be needed if the ligament tore all the way.  Follow these instructions at home: If you have a splint or brace:  Wear the splint or brace as told by your health care provider. Remove it only as told by your health care provider.  Loosen the splint or brace if your toes tingle, become numb, or turn cold and blue.  Keep the splint or brace clean.  If the splint or brace is not waterproof: ? Do not let it get wet. ? Cover it with a watertight covering when you take a bath or a shower. If you have a cast:  Do not stick anything inside the cast to scratch your skin. Doing  that increases your risk of infection.  Check the skin around the cast every day. Tell your health care provider about any concerns.  You may put lotion on dry skin around the edges of the cast. Do not put lotion on the skin underneath the cast.  Keep the cast clean.  If the cast is not waterproof: ? Do not let it get wet. ? Cover it with a watertight covering when you take a bath or a shower. Managing pain, stiffness, and swelling   If directed, put ice on the injured area. ? If you have a removable splint or brace, remove it as told by your health care provider. ? Put ice in a plastic bag. ? Place a towel between your skin and the bag or between your cast and the bag. ? Leave the ice on for 20 minutes, 2-3 times a day.  Gently move your toes often to avoid stiffness and to lessen swelling.  Elevate the injured area above the level of your heart while you are sitting or lying down.  Take over-the-counter and prescription medicines only as told by your health care provider. General instructions  Do exercises as told by your health care provider.  Keep all follow-up visits as told by your health care provider. This is important. Contact a health care provider if:  You have pain   that gets worse.  The cast, brace, or splint does not fit right.  The cast, brace, or splint gets damaged. Get help right away if:  You cannot use your injured joint to support any of your body weight (cannot bear weight).  You cannot move the injured joint.  You cannot walk more than a few steps without pain or without your knee buckling.  You have significant pain, swelling, or numbness below the cast, brace, or splint. This information is not intended to replace advice given to you by your health care provider. Make sure you discuss any questions you have with your health care provider. Document Released: 04/11/2005 Document Revised: 12/30/2015 Document Reviewed: 10/30/2015 Elsevier  Interactive Patient Education  2017 [employer]  

## 2017-01-16 ENCOUNTER — Encounter: Payer: 59 | Admitting: Physician Assistant

## 2017-01-18 ENCOUNTER — Ambulatory Visit (INDEPENDENT_AMBULATORY_CARE_PROVIDER_SITE_OTHER): Payer: 59 | Admitting: Physician Assistant

## 2017-01-18 ENCOUNTER — Encounter: Payer: Self-pay | Admitting: Physician Assistant

## 2017-01-18 VITALS — BP 146/94 | HR 65 | Temp 98.7°F | Ht 62.0 in | Wt 189.6 lb

## 2017-01-18 DIAGNOSIS — I10 Essential (primary) hypertension: Secondary | ICD-10-CM | POA: Diagnosis not present

## 2017-01-18 NOTE — Progress Notes (Signed)
BP (!) 146/94   Pulse 65   Temp 98.7 F (37.1 C) (Oral)   Ht  (1.575 m)   Wt 189 lb 9.6 oz (86 kg)   BMI 34.68 kg/m    Subjective:    Patient ID: Kelly Jennings, female    DOB: 06/06/1980, 36 y.o.   MRN: [id number]  HPI: Kelly Jennings is a 36 y.o. female presenting on 01/18/2017 for Follow-up (Routine follow up and labs ) and Hypertension  Patient was seen last week having knee pain and coincidentally her blood pressure was found to be elevated. She has had some elevation of the past 6 months. She does have triamterene-hydrochlorothiazide that she is supposed to take daily. She admits to not taking it regularly at all. She also states that she has some swelling in her lower legs and hands. She did not notice a change when she was started on the NSAID with increased edema. She denies any shortness of breath or chest pain.  Relevant past medical, surgical, family and social history reviewed and updated as indicated. Allergies and medications reviewed and updated.  Past Medical History:  Diagnosis Date  . GERD (gastroesophageal reflux disease)   . Hypertension   . Migraines     Past Surgical History:  Procedure Laterality Date  . CHOLECYSTECTOMY      Review of Systems  Constitutional: Negative.   HENT: Negative.   Eyes: Negative.   Respiratory: Negative.   Gastrointestinal: Negative.   Genitourinary: Negative.     Allergies as of 01/18/2017      Reactions   Penicillins       Medication List       Accurate as of 01/18/17 12:28 PM. Always use your most recent med list.          naproxen 500 MG tablet Commonly known as:  NAPROSYN Take 1 tablet (500 mg total) by mouth 2 (two) times daily with a meal.   norethindrone-ethinyl estradiol 1/35 tablet Commonly known as:  NECON 1/35 (28) Take 1 tablet by mouth daily.   phentermine 37.5 MG tablet Commonly known as:  ADIPEX-P Take 1 tablet by mouth before breakfast   topiramate 25 MG tablet Commonly known as:   TOPAMAX Take 25 mg by mouth daily.   triamterene-hydrochlorothiazide 37.5-25 MG tablet Commonly known as:  MAXZIDE-25 Take 1 tablet by mouth daily.          Objective:    BP (!) 146/94   Pulse 65   Temp 98.7 F (37.1 C) (Oral)   Ht  (1.575 m)   Wt 189 lb 9.6 oz (86 kg)   BMI 34.68 kg/m   Allergies  Allergen Reactions  . Penicillins     Physical Exam  Constitutional: She is oriented to person, place, and time. She appears well-developed and well-nourished.  HENT:  Head: Normocephalic and atraumatic.  Right Ear: Tympanic membrane, external ear and ear canal normal.  Left Ear: Tympanic membrane, external ear and ear canal normal.  Nose: Nose normal. No rhinorrhea.  Mouth/Throat: Oropharynx is clear and moist and mucous membranes are normal. No oropharyngeal exudate or posterior oropharyngeal erythema.  Eyes: Pupils are equal, round, and reactive to light. Conjunctivae and EOM are normal.  Neck: Normal range of motion. Neck supple.  Cardiovascular: Normal rate, regular rhythm, normal heart sounds and intact distal pulses.   Pulmonary/Chest: Effort normal and breath sounds normal.  Abdominal: Soft. Bowel sounds are normal.  Neurological: She is alert and oriented to  person, place, and time. She has normal reflexes.  Skin: Skin is warm and dry. No rash noted.  Psychiatric: She has a normal mood and affect. Her behavior is normal. Judgment and thought content normal.        Assessment & Plan:   1. Essential hypertension Take triamterene hydrochlorothiazide one daily as directed. Recheck in one month with annual exam.   Current Outpatient Prescriptions:  .  naproxen (NAPROSYN) 500 MG tablet, Take 1 tablet (500 mg total) by mouth 2 (two) times daily with a meal., Disp: 60 tablet, Rfl: 1 .  norethindrone-ethinyl estradiol 1/35 (NECON 1/35, 28,) tablet, Take 1 tablet by mouth daily., Disp: 1 Package, Rfl: 11 .  phentermine (ADIPEX-P) 37.5 MG tablet, Take 1 tablet by  mouth before breakfast, Disp: , Rfl: 0 .  topiramate (TOPAMAX) 25 MG tablet, Take 25 mg by mouth daily., Disp: , Rfl: 0 .  triamterene-hydrochlorothiazide (MAXZIDE-25) 37.5-25 MG tablet, Take 1 tablet by mouth daily., Disp: 30 tablet, Rfl: 5 Continue all other maintenance medications as listed above.  Follow up plan: Return for cpe.  Educational handout given for survey  Remus Loffler PA-C [facility] [address]  [place], [place] [address] [phone]   01/18/2017, 12:28 PM

## 2017-01-18 NOTE — Patient Instructions (Signed)
In a few days you may receive a survey in the mail or online from [employer] regarding your visit with us today. Please take a moment to fill this out. Your feedback is very important to our whole office. It can help us better understand your needs as well as improve your experience and satisfaction. Thank you for taking your time to complete it. We care about you.  Reymundo Winship, PA-C  

## 2017-02-15 ENCOUNTER — Encounter: Payer: Self-pay | Admitting: Physician Assistant

## 2017-02-15 ENCOUNTER — Ambulatory Visit (INDEPENDENT_AMBULATORY_CARE_PROVIDER_SITE_OTHER): Payer: 59 | Admitting: Physician Assistant

## 2017-02-15 VITALS — BP 133/88 | HR 80 | Temp 98.8°F | Ht 62.0 in | Wt 190.0 lb

## 2017-02-15 DIAGNOSIS — Z01419 Encounter for gynecological examination (general) (routine) without abnormal findings: Secondary | ICD-10-CM

## 2017-02-15 DIAGNOSIS — I10 Essential (primary) hypertension: Secondary | ICD-10-CM

## 2017-02-15 MED ORDER — PHENTERMINE HCL 37.5 MG PO TABS: 37.5000 mg | ORAL_TABLET | Freq: Every day | ORAL | 1 refills | Status: DC

## 2017-02-15 MED ORDER — NORETHINDRONE-ETH ESTRADIOL 1-35 MG-MCG PO TABS: 1.0000 | ORAL_TABLET | Freq: Every day | ORAL | 12 refills | Status: DC

## 2017-02-15 MED ORDER — TOPIRAMATE 25 MG PO TABS: 25.0000 mg | ORAL_TABLET | Freq: Every day | ORAL | 11 refills | Status: DC

## 2017-02-15 MED ORDER — TRIAMTERENE-HCTZ 37.5-25 MG PO TABS: 1.0000 | ORAL_TABLET | Freq: Every day | ORAL | 11 refills | Status: DC

## 2017-02-15 NOTE — Patient Instructions (Signed)
In a few days you may receive a survey in the mail or online from [employer] regarding your visit with us today. Please take a moment to fill this out. Your feedback is very important to our whole office. It can help us better understand your needs as well as improve your experience and satisfaction. Thank you for taking your time to complete it. We care about you.  Wynetta Seith, PA-C  

## 2017-02-15 NOTE — Progress Notes (Signed)
BP 133/88   Pulse 80   Temp 98.8 F (37.1 C) (Oral)   Ht '5\' 2"'  (1.575 m)   Wt 190 lb (86.2 kg)   BMI 34.75 kg/m    Subjective:    Patient ID: Kelly Jennings, female    DOB: 1980-08-29, 36 y.o.   MRN: [id number]  HPI: Kelly Jennings is a 36 y.o. female presenting on 02/15/2017 for Annual Exam  This patient comes in for annual well physical examination. All medications are reviewed today. There are no reports of any problems with the medications. All of the medical conditions are reviewed and updated.  Lab work is reviewed and will be ordered as medically necessary. There are no new problems reported with today's visit.  Patient reports doing well overall.  She is also continue with phentermine for weight loss.  She has lost a good 10 pounds over the past year with going to the bariatric clinic and [facility] for the phentermine.  She takes oral B12.  We will start prescribing her medication but we will see her every 2 months for recheck.  Relevant past medical, surgical, family and social history reviewed and updated as indicated. Allergies and medications reviewed and updated.  Past Medical History:  Diagnosis Date  . GERD (gastroesophageal reflux disease)   . Hypertension   . Migraines     Past Surgical History:  Procedure Laterality Date  . CHOLECYSTECTOMY      Review of Systems  Constitutional: Negative.  Negative for activity change, fatigue and fever.  HENT: Negative.   Eyes: Negative.   Respiratory: Negative.  Negative for cough.   Cardiovascular: Negative.  Negative for chest pain.  Gastrointestinal: Negative.  Negative for abdominal pain.  Endocrine: Negative.   Genitourinary: Negative.  Negative for dysuria.  Musculoskeletal: Negative.   Skin: Negative.   Neurological: Negative.     Allergies as of 02/15/2017      Reactions   Penicillins       Medication List       Accurate as of 02/15/17 11:12 AM. Always use your most recent med list.            naproxen 500 MG tablet Commonly known as:  NAPROSYN Take 1 tablet (500 mg total) by mouth 2 (two) times daily with a meal.   norethindrone-ethinyl estradiol 1/35 tablet Commonly known as:  NECON 1/35 (28) Take 1 tablet by mouth daily.   phentermine 37.5 MG tablet Commonly known as:  ADIPEX-P Take 1 tablet (37.5 mg total) by mouth daily before breakfast.   topiramate 25 MG tablet Commonly known as:  TOPAMAX Take 1 tablet (25 mg total) by mouth daily.   triamterene-hydrochlorothiazide 37.5-25 MG tablet Commonly known as:  MAXZIDE-25 Take 1 tablet by mouth daily.          Objective:    BP 133/88   Pulse 80   Temp 98.8 F (37.1 C) (Oral)   Ht '5\' 2"'  (1.575 m)   Wt 190 lb (86.2 kg)   BMI 34.75 kg/m   Allergies  Allergen Reactions  . Penicillins     Physical Exam  Constitutional: She is oriented to person, place, and time. She appears well-developed and well-nourished.  HENT:  Head: Normocephalic and atraumatic.  Eyes: Pupils are equal, round, and reactive to light. Conjunctivae and EOM are normal.  Neck: Normal range of motion. Neck supple.  Cardiovascular: Normal rate, regular rhythm, normal heart sounds and intact distal pulses.   Pulmonary/Chest: Effort normal and  breath sounds normal. Right breast exhibits no mass, no skin change and no tenderness. Left breast exhibits no mass, no skin change and no tenderness. Breasts are symmetrical.  Abdominal: Soft. Bowel sounds are normal.  Genitourinary: Vagina normal and uterus normal. Rectal exam shows no fissure. No breast swelling, tenderness, discharge or bleeding. There is no tenderness or lesion on the right labia. There is no tenderness or lesion on the left labia. Uterus is not deviated, not enlarged and not tender. Cervix exhibits no motion tenderness, no discharge and no friability. Right adnexum displays no mass, no tenderness and no fullness. Left adnexum displays no mass, no tenderness and no fullness. No  tenderness or bleeding in the vagina. No vaginal discharge found.  Neurological: She is alert and oriented to person, place, and time. She has normal reflexes.  Skin: Skin is warm and dry. No rash noted.  Psychiatric: She has a normal mood and affect. Her behavior is normal. Judgment and thought content normal.    Results for orders placed or performed in visit on 02/01/16  IGP, Aptima HPV, rfx 16/18,45  Result Value Ref Range   DIAGNOSIS: Comment    Specimen adequacy: Comment    Clinician Provided ICD10 Comment    Performed by: Comment    PAP Smear Comment .    Note: Comment    Test Methodology Comment    HPV Aptima Negative Negative      Assessment & Plan:   1. Well female exam with routine gynecological exam - CBC with Differential/Platelet - CMP14+EGFR - Lipid panel - TSH - IGP, Aptima HPV, rfx 16/18,45  2. Essential hypertension - triamterene-hydrochlorothiazide (MAXZIDE-25) 37.5-25 MG tablet; Take 1 tablet by mouth daily.  Dispense: 30 tablet; Refill: 11    Current Outpatient Prescriptions:  .  naproxen (NAPROSYN) 500 MG tablet, Take 1 tablet (500 mg total) by mouth 2 (two) times daily with a meal., Disp: 60 tablet, Rfl: 1 .  norethindrone-ethinyl estradiol 1/35 (NECON 1/35, 28,) tablet, Take 1 tablet by mouth daily., Disp: 1 Package, Rfl: 12 .  topiramate (TOPAMAX) 25 MG tablet, Take 1 tablet (25 mg total) by mouth daily., Disp: 30 tablet, Rfl: 11 .  triamterene-hydrochlorothiazide (MAXZIDE-25) 37.5-25 MG tablet, Take 1 tablet by mouth daily., Disp: 30 tablet, Rfl: 11 .  phentermine (ADIPEX-P) 37.5 MG tablet, Take 1 tablet (37.5 mg total) by mouth daily before breakfast., Disp: 30 tablet, Rfl: 1 Continue all other maintenance medications as listed above.  Follow up plan: Return in about 2 months (around 04/17/2017), or if symptoms worsen or fail to improve, for recheck weight.  Educational handout given for [place] PA-C [place] [address]  [place], [place] [address] [phone]   02/15/2017, 11:12 AM

## 2017-02-16 LAB — CBC WITH DIFFERENTIAL/PLATELET
BASOS ABS: 0 10*3/uL (ref 0.0–0.2)
Basos: 0 %
EOS (ABSOLUTE): 0.1 10*3/uL (ref 0.0–0.4)
Eos: 2 %
HEMATOCRIT: 38.3 % (ref 34.0–46.6)
Hemoglobin: 12.4 g/dL (ref 11.1–15.9)
IMMATURE GRANULOCYTES: 0 %
Immature Grans (Abs): 0 10*3/uL (ref 0.0–0.1)
LYMPHS ABS: 1.9 10*3/uL (ref 0.7–3.1)
LYMPHS: 31 %
MCH: 31 pg (ref 26.6–33.0)
MCHC: 32.4 g/dL (ref 31.5–35.7)
MCV: 96 fL (ref 79–97)
MONOCYTES: 8 %
Monocytes Absolute: 0.5 10*3/uL (ref 0.1–0.9)
NEUTROS PCT: 59 %
Neutrophils Absolute: 3.7 10*3/uL (ref 1.4–7.0)
Platelets: 224 10*3/uL (ref 150–379)
RBC: 4 x10E6/uL (ref 3.77–5.28)
RDW: 13.4 % (ref 12.3–15.4)
WBC: 6.3 10*3/uL (ref 3.4–10.8)

## 2017-02-16 LAB — LIPID PANEL
CHOLESTEROL TOTAL: 178 mg/dL (ref 100–199)
Chol/HDL Ratio: 3.1 ratio (ref 0.0–4.4)
HDL: 58 mg/dL (ref >39)
LDL CALC: 97 mg/dL (ref 0–99)
Triglycerides: 114 mg/dL (ref 0–149)
VLDL Cholesterol Cal: 23 mg/dL (ref 5–40)

## 2017-02-16 LAB — CMP14+EGFR
ALBUMIN: 4.1 g/dL (ref 3.5–5.5)
ALK PHOS: 65 IU/L (ref 39–117)
ALT: 22 IU/L (ref 0–32)
AST: 17 IU/L (ref 0–40)
Albumin/Globulin Ratio: 1.6 (ref 1.2–2.2)
BILIRUBIN TOTAL: 0.3 mg/dL (ref 0.0–1.2)
BUN / CREAT RATIO: 13 (ref 9–23)
BUN: 10 mg/dL (ref 6–20)
CHLORIDE: 103 mmol/L (ref 96–106)
CO2: 24 mmol/L (ref 20–29)
Calcium: 9 mg/dL (ref 8.7–10.2)
Creatinine, Ser: 0.75 mg/dL (ref 0.57–1.00)
GFR calc non Af Amer: 102 mL/min/{1.73_m2} (ref >59)
GFR, EST AFRICAN AMERICAN: 118 mL/min/{1.73_m2} (ref >59)
GLOBULIN, TOTAL: 2.5 g/dL (ref 1.5–4.5)
Glucose: 77 mg/dL (ref 65–99)
Potassium: 4.3 mmol/L (ref 3.5–5.2)
SODIUM: 138 mmol/L (ref 134–144)
Total Protein: 6.6 g/dL (ref 6.0–8.5)

## 2017-02-16 LAB — TSH: TSH: 0.872 u[IU]/mL (ref 0.450–4.500)

## 2017-02-20 LAB — IGP, APTIMA HPV, RFX 16/18,45
HPV Aptima: NEGATIVE
PAP SMEAR COMMENT: 0

## 2017-06-05 ENCOUNTER — Ambulatory Visit: Payer: 59 | Admitting: Family Medicine

## 2017-06-05 ENCOUNTER — Encounter: Payer: Self-pay | Admitting: Family Medicine

## 2017-06-05 VITALS — BP 131/88 | HR 86 | Temp 98.9°F | Ht 62.0 in | Wt 192.0 lb

## 2017-06-05 DIAGNOSIS — J0100 Acute maxillary sinusitis, unspecified: Secondary | ICD-10-CM

## 2017-06-05 MED ORDER — CEFDINIR 300 MG PO CAPS: 300.0000 mg | ORAL_CAPSULE | Freq: Two times a day (BID) | ORAL | 0 refills | Status: DC

## 2017-06-05 MED ORDER — HYDROCOD POLST-CPM POLST ER 10-8 MG/5ML PO SUER: 5.0000 mL | Freq: Two times a day (BID) | ORAL | 0 refills | Status: DC | PRN

## 2017-06-05 NOTE — Progress Notes (Signed)
Subjective: CC:Sinus symptoms PCP: Kelly Loffler, PA-C ZOX:WRUEAV Kelly Jennings is a 37 y.o. female presenting to clinic today for:  1. Sinus symptoms  Patient reports sinus pressure, headache, mildly productive cough, chest congestion and dental pain that started 1 week ago.  She notes that symptoms actually seem to improve but then started coming back and getting worse on Sunday.  She notes that cough is worse at nighttime.  Denies hemoptysis, SOB, dizziness, rash, nausea, vomiting, diarrhea, fevers, chills, myalgia, sick contacts, recent travel.  Patient has used Mucinex Kelly, NyQuil, and over-the-counter nasal spray and pseudoephedrine with little relief of symptoms.  No history of COPD or asthma.  No tobacco use/ exposure.    ROS: Per HPI  Allergies  Allergen Reactions  . Penicillins    Past Medical History:  Diagnosis Date  . GERD (gastroesophageal reflux disease)   . Hypertension   . Migraines     Current Outpatient Medications:  .  norethindrone-ethinyl estradiol 1/35 (NECON 1/35, 28,) tablet, Take 1 tablet by mouth daily., Disp: 1 Package, Rfl: 12 .  phentermine (ADIPEX-P) 37.5 MG tablet, Take 1 tablet (37.5 mg total) by mouth daily before breakfast., Disp: 30 tablet, Rfl: 1 .  topiramate (TOPAMAX) 25 MG tablet, Take 1 tablet (25 mg total) by mouth daily., Disp: 30 tablet, Rfl: 11 .  triamterene-hydrochlorothiazide (MAXZIDE-25) 37.5-25 MG tablet, Take 1 tablet by mouth daily., Disp: 30 tablet, Rfl: 11  Social History   Socioeconomic History  . Marital status: Married    Spouse name: Not on file  . Number of children: Not on file  . Years of education: Not on file  . Highest education level: Not on file  Social Needs  . Financial resource strain: Not on file  . Food insecurity - worry: Not on file  . Food insecurity - inability: Not on file  . Transportation needs - medical: Not on file  . Transportation needs - non-medical: Not on file  Occupational History  . Not on  file  Tobacco Use  . Smoking status: Never Smoker  . Smokeless tobacco: Never Used  Substance and Sexual Activity  . Alcohol use: No  . Drug use: No  . Sexual activity: Not on file  Other Topics Concern  . Not on file  Social History Narrative  . Not on file   Family History  Problem Relation Age of Onset  . Hyperlipidemia Mother   . Hypertension Mother   . Hyperlipidemia Father   . Hypertension Father   . Diabetes Father     Objective: Office vital signs reviewed. BP 131/88   Pulse 86   Temp 98.9 F (37.2 C) (Oral)   Ht 5\' 2"  (1.575 m)   Wt 192 lb (87.1 kg)   BMI 35.12 kg/m   Physical Examination:  General: Awake, alert, well nourished, nontoxic appearing, No acute distress HEENT: + Maxillary sinus TTP    Neck: No masses palpated. No lymphadenopathy    Ears: Tympanic membranes intact, normal light reflex, no erythema, no bulging    Eyes: PERRLA, extraocular membranes intact, sclera white    Nose: nasal turbinates moist, clear nasal discharge    Throat: moist mucus membranes, no erythema, no tonsillar exudate.  Airway is patent Cardio: regular rate and rhythm, S1S2 heard, no murmurs appreciated Pulm: clear to auscultation bilaterally, no wheezes, rhonchi or rales; normal work of breathing on room air  Assessment/ Plan: 37 y.o. female   1. Acute non-recurrent maxillary sinusitis Clinically suggestive of acute  bacterial sinusitis.  She notes having tolerated cephalosporins without difficulty in the past.  Omnicef 300 mg p.o. twice daily for 10 days.  Tussionex also prescribed to use twice a day as needed for cough.  Cautioned sedation.  Avoid driving and operating heavy machinery while on this medication.  The Narcotic Database has been reviewed.  There were no red flags.  May continue Mucinex Kelly if she finds is helpful.  Would discontinue other OTC medications.  Push oral fluids.  Work note provided excusing for the next 2 days given.  Patient to follow-up if  needed.  Meds ordered this encounter  Medications  . cefdinir (OMNICEF) 300 MG capsule    Sig: Take 1 capsule (300 mg total) by mouth 2 (two) times daily. 1 po BID    Dispense:  20 capsule    Refill:  0  . chlorpheniramine-HYDROcodone (TUSSIONEX) 10-8 MG/5ML SUER    Sig: Take 5 mLs by mouth every 12 (twelve) hours as needed for cough.    Dispense:  60 mL    Refill:  0     Kelly Barber Hulen SkainsM Mayana Irigoyen, DO [facility] [phone]

## 2017-06-05 NOTE — Patient Instructions (Signed)

## 2017-07-25 ENCOUNTER — Encounter: Payer: Self-pay | Admitting: Physician Assistant

## 2017-07-25 ENCOUNTER — Ambulatory Visit: Payer: 59 | Admitting: Physician Assistant

## 2017-07-25 VITALS — BP 123/83 | HR 87 | Temp 98.0°F | Ht 62.0 in | Wt 195.6 lb

## 2017-07-25 DIAGNOSIS — K219 Gastro-esophageal reflux disease without esophagitis: Secondary | ICD-10-CM

## 2017-07-25 DIAGNOSIS — R635 Abnormal weight gain: Secondary | ICD-10-CM | POA: Diagnosis not present

## 2017-07-25 DIAGNOSIS — I10 Essential (primary) hypertension: Secondary | ICD-10-CM | POA: Diagnosis not present

## 2017-07-25 MED ORDER — PHENTERMINE HCL 37.5 MG PO TABS: 37.5000 mg | ORAL_TABLET | Freq: Every day | ORAL | 2 refills | Status: DC

## 2017-07-26 NOTE — Progress Notes (Signed)
This patient comes in for recheck on her medications and medical conditions.  She does have reflux, hypertension, and is working hard to try to lose weight.  She has taken phentermine in the past but has been out of it for quite some time.  She is up about 2 pounds from her last visit here.  She states that in May she will have to start using the mail-in pharmacy and she will let us know what this is.  And at that time we will send in her birth control pills, topiramate, and Maxide.    BP 123/83   Pulse 87   Temp 98 F (36.7 C) (Oral)   Ht 5\' 2"  (1.575 m)   Wt 195 lb 9.6 oz (88.7 kg)   BMI 35.78 kg/m    Subjective:    Patient ID: Kelly Jennings, female    DOB: 1980/08/11, 37 y.o.   MRN: [id number]  HPI: Kelly Jennings is a 37 y.o. female presenting on 07/25/2017 for Medication Refill    Past Medical History:  Diagnosis Date  . GERD (gastroesophageal reflux disease)   . Hypertension   . Migraines    Relevant past medical, surgical, family and social history reviewed and updated as indicated. Interim medical history since our last visit reviewed. Allergies and medications reviewed and updated. DATA REVIEWED: CHART IN EPIC  Family History reviewed for pertinent findings.  Review of Systems  Constitutional: Positive for fatigue.  HENT: Negative.   Eyes: Negative.   Respiratory: Negative.   Gastrointestinal: Negative.   Genitourinary: Negative.   Musculoskeletal: Positive for back pain.    Allergies as of 07/25/2017      Reactions   Penicillins       Medication List        Accurate as of 07/25/17 11:59 PM. Always use your most recent med list.          norethindrone-ethinyl estradiol 1/35 tablet Commonly known as:  NECON 1/35 (28) Take 1 tablet by mouth daily.   phentermine 37.5 MG tablet Commonly known as:  ADIPEX-P Take 1 tablet (37.5 mg total) by mouth daily before breakfast.   topiramate 25 MG tablet Commonly known as:  TOPAMAX Take 1 tablet (25 mg total)  by mouth daily.   triamterene-hydrochlorothiazide 37.5-25 MG tablet Commonly known as:  MAXZIDE-25 Take 1 tablet by mouth daily.          Objective:    BP 123/83   Pulse 87   Temp 98 F (36.7 C) (Oral)   Ht 5\' 2"  (1.575 m)   Wt 195 lb 9.6 oz (88.7 kg)   BMI 35.78 kg/m   Allergies  Allergen Reactions  . Penicillins     Wt Readings from Last 3 Encounters:  07/25/17 195 lb 9.6 oz (88.7 kg)  06/05/17 192 lb (87.1 kg)  02/15/17 190 lb (86.2 kg)    Physical Exam  Constitutional: She is oriented to person, place, and time. She appears well-developed and well-nourished.  HENT:  Head: Normocephalic and atraumatic.  Eyes: Pupils are equal, round, and reactive to light. Conjunctivae and EOM are normal.  Cardiovascular: Normal rate, regular rhythm, normal heart sounds and intact distal pulses.  Pulmonary/Chest: Effort normal and breath sounds normal.  Abdominal: Soft. Bowel sounds are normal.  Neurological: She is alert and oriented to person, place, and time. She has normal reflexes.  Skin: Skin is warm and dry. No rash noted.  Psychiatric: She has a normal mood and affect. Her behavior is  normal. Judgment and thought content normal.  Nursing note and vitals reviewed.       Assessment & Plan:   1. Weight gain - phentermine (ADIPEX-P) 37.5 MG tablet; Take 1 tablet (37.5 mg total) by mouth daily before breakfast.  Dispense: 30 tablet; Refill: 2  2. Essential hypertension  3. Gastroesophageal reflux disease without esophagitis    Continue all other maintenance medications as listed above.  Follow up plan: Recheck 3 months  Educational handout given for survey  Remus LofflerAngel S. Rashon Rezek PA-C [facility] [address]  [place], [place] [address] [phone]   07/26/2017, 9:08 AM

## 2017-09-21 ENCOUNTER — Telehealth: Payer: Self-pay | Admitting: *Deleted

## 2017-09-21 ENCOUNTER — Telehealth: Payer: Self-pay | Admitting: Physician Assistant

## 2017-09-21 NOTE — Telephone Encounter (Signed)
Which meds needs to be sent - LM to find out??

## 2017-09-21 NOTE — Telephone Encounter (Signed)
Patient requesting refills of   90 day quantity for all prescriptions to be sent to [facility].  Insurance requiring change of pharmacy and quantity.

## 2017-09-24 ENCOUNTER — Other Ambulatory Visit: Payer: Self-pay | Admitting: Physician Assistant

## 2017-09-24 DIAGNOSIS — I10 Essential (primary) hypertension: Secondary | ICD-10-CM

## 2017-09-24 MED ORDER — TRIAMTERENE-HCTZ 37.5-25 MG PO TABS: 1.0000 | ORAL_TABLET | Freq: Every day | ORAL | 3 refills | Status: DC

## 2017-09-24 MED ORDER — TOPIRAMATE 25 MG PO TABS: 25.0000 mg | ORAL_TABLET | Freq: Every day | ORAL | 3 refills | Status: DC

## 2017-09-24 MED ORDER — NORETHINDRONE-ETH ESTRADIOL 1-35 MG-MCG PO TABS: 1.0000 | ORAL_TABLET | Freq: Every day | ORAL | 3 refills | Status: DC

## 2017-09-24 NOTE — Telephone Encounter (Signed)
Sent medications, except phentermine to pharmacy

## 2017-09-25 NOTE — Telephone Encounter (Signed)
Message left that rx's sent to pharmacy

## 2018-05-22 ENCOUNTER — Encounter: Payer: 59 | Admitting: Physician Assistant

## 2018-05-29 ENCOUNTER — Ambulatory Visit (INDEPENDENT_AMBULATORY_CARE_PROVIDER_SITE_OTHER): Payer: BLUE CROSS/BLUE SHIELD | Admitting: Physician Assistant

## 2018-05-29 ENCOUNTER — Encounter: Payer: Self-pay | Admitting: Physician Assistant

## 2018-05-29 VITALS — BP 143/91 | HR 87 | Ht 62.0 in | Wt 210.6 lb

## 2018-05-29 DIAGNOSIS — Z01411 Encounter for gynecological examination (general) (routine) with abnormal findings: Secondary | ICD-10-CM

## 2018-05-29 DIAGNOSIS — Z01419 Encounter for gynecological examination (general) (routine) without abnormal findings: Secondary | ICD-10-CM | POA: Diagnosis not present

## 2018-05-29 DIAGNOSIS — R635 Abnormal weight gain: Secondary | ICD-10-CM

## 2018-05-29 DIAGNOSIS — I10 Essential (primary) hypertension: Secondary | ICD-10-CM

## 2018-05-29 MED ORDER — TOPIRAMATE 25 MG PO TABS: 25.0000 mg | ORAL_TABLET | Freq: Every day | ORAL | 3 refills | Status: DC

## 2018-05-29 MED ORDER — TRIAMTERENE-HCTZ 37.5-25 MG PO TABS: 1.0000 | ORAL_TABLET | Freq: Every day | ORAL | 3 refills | Status: DC

## 2018-05-29 MED ORDER — PHENTERMINE HCL 37.5 MG PO TABS: 37.5000 mg | ORAL_TABLET | Freq: Every day | ORAL | 2 refills | Status: DC

## 2018-05-29 NOTE — Progress Notes (Signed)
BP (!) 143/91   Pulse 87   Ht '5\' 2"'  (1.575 m)   Wt 210 lb 9.6 oz (95.5 kg)   BMI 38.52 kg/m    Subjective:    Patient ID: Kelly Jennings, female    DOB: Oct 03, 1980, 38 y.o.   MRN: [id number]  HPI: SEATTLE DALPORTO is a 38 y.o. female presenting on 05/29/2018 for Annual Exam  This patient comes in for annual well physical examination. All medications are reviewed today. There are no reports of any problems with the medications. All of the medical conditions are reviewed and updated.  Lab work is reviewed and will be ordered as medically necessary. There are no new problems reported with today's visit.  Patient reports doing well overall.   Past Medical History:  Diagnosis Date  . GERD (gastroesophageal reflux disease)   . Hypertension   . Migraines    Relevant past medical, surgical, family and social history reviewed and updated as indicated. Interim medical history since our last visit reviewed. Allergies and medications reviewed and updated. DATA REVIEWED: CHART IN EPIC  Family History reviewed for pertinent findings.  Review of Systems  Constitutional: Negative.  Negative for activity change, fatigue and fever.  HENT: Negative.   Eyes: Negative.   Respiratory: Negative.  Negative for cough.   Cardiovascular: Negative.  Negative for chest pain.  Gastrointestinal: Negative.  Negative for abdominal pain.  Endocrine: Negative.   Genitourinary: Negative.  Negative for dysuria.  Musculoskeletal: Negative.   Skin: Negative.   Neurological: Negative.     Allergies as of 05/29/2018      Reactions   Penicillins       Medication List       Accurate as of May 29, 2018 10:19 PM. Always use your most recent med list.        phentermine 37.5 MG tablet Commonly known as:  ADIPEX-P Take 1 tablet (37.5 mg total) by mouth daily before breakfast.   topiramate 25 MG tablet Commonly known as:  TOPAMAX Take 1 tablet (25 mg total) by mouth daily.     triamterene-hydrochlorothiazide 37.5-25 MG tablet Commonly known as:  MAXZIDE-25 Take 1 tablet by mouth daily.          Objective:    BP (!) 143/91   Pulse 87   Ht '5\' 2"'  (1.575 m)   Wt 210 lb 9.6 oz (95.5 kg)   BMI 38.52 kg/m   Allergies  Allergen Reactions  . Penicillins     Wt Readings from Last 3 Encounters:  05/29/18 210 lb 9.6 oz (95.5 kg)  07/25/17 195 lb 9.6 oz (88.7 kg)  06/05/17 192 lb (87.1 kg)    Physical Exam Constitutional:      Appearance: She is well-developed.  HENT:     Head: Normocephalic and atraumatic.  Eyes:     Conjunctiva/sclera: Conjunctivae normal.     Pupils: Pupils are equal, round, and reactive to light.  Neck:     Musculoskeletal: Normal range of motion and neck supple.  Cardiovascular:     Rate and Rhythm: Normal rate and regular rhythm.     Heart sounds: Normal heart sounds.  Pulmonary:     Effort: Pulmonary effort is normal.     Breath sounds: Normal breath sounds.  Chest:     Breasts: Breasts are symmetrical.        Right: No mass, skin change or tenderness.        Left: No mass, skin change or tenderness.  Abdominal:     General: Bowel sounds are normal.     Palpations: Abdomen is soft.  Genitourinary:    Labia:        Right: No tenderness or lesion.        Left: No tenderness or lesion.      Vagina: Normal. No vaginal discharge, tenderness or bleeding.     Cervix: No cervical motion tenderness, discharge or friability.     Uterus: Not deviated, not enlarged and not tender.      Adnexa:        Right: No mass, tenderness or fullness.         Left: No mass, tenderness or fullness.       Rectum: No anal fissure.  Skin:    General: Skin is warm and dry.     Findings: No rash.  Neurological:     Mental Status: She is alert and oriented to person, place, and time.     Deep Tendon Reflexes: Reflexes are normal and symmetric.  Psychiatric:        Behavior: Behavior normal.        Thought Content: Thought content normal.         Judgment: Judgment normal.     Results for orders placed or performed in visit on 02/15/17  CBC with Differential/Platelet  Result Value Ref Range   WBC 6.3 3.4 - 10.8 x10E3/uL   RBC 4.00 3.77 - 5.28 x10E6/uL   Hemoglobin 12.4 11.1 - 15.9 g/dL   Hematocrit 38.3 34.0 - 46.6 %   MCV 96 79 - 97 fL   MCH 31.0 26.6 - 33.0 pg   MCHC 32.4 31.5 - 35.7 g/dL   RDW 13.4 12.3 - 15.4 %   Platelets 224 150 - 379 x10E3/uL   Neutrophils 59 Not Estab. %   Lymphs 31 Not Estab. %   Monocytes 8 Not Estab. %   Eos 2 Not Estab. %   Basos 0 Not Estab. %   Neutrophils Absolute 3.7 1.4 - 7.0 x10E3/uL   Lymphocytes Absolute 1.9 0.7 - 3.1 x10E3/uL   Monocytes Absolute 0.5 0.1 - 0.9 x10E3/uL   EOS (ABSOLUTE) 0.1 0.0 - 0.4 x10E3/uL   Basophils Absolute 0.0 0.0 - 0.2 x10E3/uL   Immature Granulocytes 0 Not Estab. %   Immature Grans (Abs) 0.0 0.0 - 0.1 x10E3/uL  CMP14+EGFR  Result Value Ref Range   Glucose 77 65 - 99 mg/dL   BUN 10 6 - 20 mg/dL   Creatinine, Ser 0.75 0.57 - 1.00 mg/dL   GFR calc non Af Amer 102 >59 mL/min/1.73   GFR calc Af Amer 118 >59 mL/min/1.73   BUN/Creatinine Ratio 13 9 - 23   Sodium 138 134 - 144 mmol/L   Potassium 4.3 3.5 - 5.2 mmol/L   Chloride 103 96 - 106 mmol/L   CO2 24 20 - 29 mmol/L   Calcium 9.0 8.7 - 10.2 mg/dL   Total Protein 6.6 6.0 - 8.5 g/dL   Albumin 4.1 3.5 - 5.5 g/dL   Globulin, Total 2.5 1.5 - 4.5 g/dL   Albumin/Globulin Ratio 1.6 1.2 - 2.2   Bilirubin Total 0.3 0.0 - 1.2 mg/dL   Alkaline Phosphatase 65 39 - 117 IU/L   AST 17 0 - 40 IU/L   ALT 22 0 - 32 IU/L  Lipid panel  Result Value Ref Range   Cholesterol, Total 178 100 - 199 mg/dL   Triglycerides 114 0 - 149 mg/dL   HDL 58 >  39 mg/dL   VLDL Cholesterol Cal 23 5 - 40 mg/dL   LDL Calculated 97 0 - 99 mg/dL   Chol/HDL Ratio 3.1 0.0 - 4.4 ratio  TSH  Result Value Ref Range   TSH 0.872 0.450 - 4.500 uIU/mL  IGP, Aptima HPV, rfx 16/18,45  Result Value Ref Range   DIAGNOSIS: Comment     Specimen adequacy: Comment    Clinician Provided ICD10 Comment    Performed by: Comment    QC reviewed by: Comment    PAP Smear Comment .    Note: Comment    Test Methodology Comment    HPV Aptima Negative Negative      Assessment & Plan:   1. Well female exam with routine gynecological exam - CBC with Differential/Platelet; Future - CMP14+EGFR; Future - Lipid panel; Future - TSH; Future - VITAMIN D 25 Hydroxy (Vit-D Deficiency, Fractures); Future - Pap IG w/ reflex to HPV when ASC-U  2. Weight gain - phentermine (ADIPEX-P) 37.5 MG tablet; Take 1 tablet (37.5 mg total) by mouth daily before breakfast.  Dispense: 30 tablet; Refill: 2  3. Essential hypertension - triamterene-hydrochlorothiazide (MAXZIDE-25) 37.5-25 MG tablet; Take 1 tablet by mouth daily.  Dispense: 90 tablet; Refill: 3   Continue all other maintenance medications as listed above.  Follow up plan: No follow-ups on file.  Educational handout given for [place] PA-C [place] [address]  [place], [place] [address] [phone]   05/29/2018, 10:19 PM

## 2018-06-01 LAB — PAP IG W/ RFLX HPV ASCU

## 2018-07-25 DIAGNOSIS — D225 Melanocytic nevi of trunk: Secondary | ICD-10-CM | POA: Diagnosis not present

## 2018-07-25 DIAGNOSIS — L570 Actinic keratosis: Secondary | ICD-10-CM | POA: Diagnosis not present

## 2018-07-25 DIAGNOSIS — D485 Neoplasm of uncertain behavior of skin: Secondary | ICD-10-CM | POA: Diagnosis not present

## 2018-07-25 DIAGNOSIS — D235 Other benign neoplasm of skin of trunk: Secondary | ICD-10-CM | POA: Diagnosis not present

## 2018-07-25 DIAGNOSIS — D229 Melanocytic nevi, unspecified: Secondary | ICD-10-CM | POA: Diagnosis not present

## 2018-07-25 DIAGNOSIS — D2272 Melanocytic nevi of left lower limb, including hip: Secondary | ICD-10-CM | POA: Diagnosis not present

## 2019-01-16 ENCOUNTER — Other Ambulatory Visit: Payer: Self-pay

## 2019-01-16 ENCOUNTER — Encounter: Payer: Self-pay | Admitting: Family

## 2019-01-16 ENCOUNTER — Ambulatory Visit (INDEPENDENT_AMBULATORY_CARE_PROVIDER_SITE_OTHER): Payer: BC Managed Care – PPO | Admitting: Family

## 2019-01-16 DIAGNOSIS — L299 Pruritus, unspecified: Secondary | ICD-10-CM

## 2019-01-16 DIAGNOSIS — R21 Rash and other nonspecific skin eruption: Secondary | ICD-10-CM

## 2019-01-16 MED ORDER — PREDNISONE 10 MG (21) PO TBPK: ORAL_TABLET | ORAL | 0 refills | Status: DC

## 2019-01-16 MED ORDER — HYDROXYZINE PAMOATE 25 MG PO CAPS: 25.0000 mg | ORAL_CAPSULE | Freq: Three times a day (TID) | ORAL | 0 refills | Status: DC | PRN

## 2019-01-16 NOTE — Progress Notes (Signed)
Virtual Visit via telephone Note Due to COVID-19 pandemic this visit was conducted virtually. This visit type was conducted due to national recommendations for restrictions regarding the COVID-19 Pandemic (e.g. social distancing, sheltering in place) in an effort to limit this patient's exposure and mitigate transmission in our community. All issues noted in this document were discussed and addressed.  A physical exam was not performed with this format.  I connected with Kelly Jennings on 01/16/19 at 3: 37 pm by telephone and verified that I am speaking with the correct person using two identifiers. Kelly Jennings is currently located at work and no one is currently with her during visit. The provider, Evelina Dun, FNP is located in their office at time of visit.  I discussed the limitations, risks, security and privacy concerns of performing an evaluation and management service by telephone and the availability of in person appointments. I also discussed with the patient that there may be a patient responsible charge related to this service. The patient expressed understanding and agreed to proceed.   History and Present Illness:  Rash This is a new problem. The current episode started yesterday. The problem is unchanged. The affected locations include the right arm and left arm. The rash is characterized by itchiness. Associated with: started outside while walking to her car. Associated symptoms include shortness of breath. Pertinent negatives include no congestion, cough, eye pain, fever, rhinorrhea or sore throat. Past treatments include anti-itch cream. The treatment provided mild relief.      Review of Systems  Constitutional: Negative for fever.  HENT: Negative for congestion, rhinorrhea and sore throat.   Eyes: Negative for pain.  Respiratory: Positive for shortness of breath. Negative for cough.   Skin: Positive for rash.  All other systems reviewed and are negative.     Observations/Objective: No SOB or distress noted   Assessment and Plan: 1. Rash and nonspecific skin eruption - predniSONE (STERAPRED UNI-PAK 21 TAB) 10 MG (21) TBPK tablet; Use as directed  Dispense: 21 tablet; Refill: 0 - hydrOXYzine (VISTARIL) 25 MG capsule; Take 1 capsule (25 mg total) by mouth 3 (three) times daily as needed.  Dispense: 30 capsule; Refill: 0  2. Pruritus - predniSONE (STERAPRED UNI-PAK 21 TAB) 10 MG (21) TBPK tablet; Use as directed  Dispense: 21 tablet; Refill: 0 - hydrOXYzine (VISTARIL) 25 MG capsule; Take 1 capsule (25 mg total) by mouth 3 (three) times daily as needed.  Dispense: 30 capsule; Refill: 0  Avoid scratching Report any fevers or s/s of infection  Take daily Zyrtec  Pt to follow up with PCP if not improved or worsens    I discussed the assessment and treatment plan with the patient. The patient was provided an opportunity to ask questions and all were answered. The patient agreed with the plan and demonstrated an understanding of the instructions.   The patient was advised to call back or seek an in-person evaluation if the symptoms worsen or if the condition fails to improve as anticipated.  The above assessment and management plan was discussed with the patient. The patient verbalized understanding of and has agreed to the management plan. Patient is aware to call the clinic if symptoms persist or worsen. Patient is aware when to return to the clinic for a follow-up visit. Patient educated on when it is appropriate to go to the emergency department.   Time call ended:  3:50 pm  I provided 13 minutes of non-face-to-face time during this encounter.  Evelina Dun, FNP

## 2019-01-18 ENCOUNTER — Other Ambulatory Visit: Payer: Self-pay

## 2019-01-18 ENCOUNTER — Encounter: Payer: Self-pay | Admitting: Physician Assistant

## 2019-01-18 ENCOUNTER — Ambulatory Visit: Payer: BC Managed Care – PPO | Admitting: Physician Assistant

## 2019-01-18 VITALS — BP 140/96 | HR 78 | Temp 98.2°F | Ht 62.0 in | Wt 216.6 lb

## 2019-01-18 DIAGNOSIS — R601 Generalized edema: Secondary | ICD-10-CM | POA: Diagnosis not present

## 2019-01-18 DIAGNOSIS — R635 Abnormal weight gain: Secondary | ICD-10-CM

## 2019-01-18 DIAGNOSIS — R531 Weakness: Secondary | ICD-10-CM | POA: Diagnosis not present

## 2019-01-18 DIAGNOSIS — L299 Pruritus, unspecified: Secondary | ICD-10-CM | POA: Diagnosis not present

## 2019-01-18 DIAGNOSIS — K219 Gastro-esophageal reflux disease without esophagitis: Secondary | ICD-10-CM | POA: Diagnosis not present

## 2019-01-18 DIAGNOSIS — R0600 Dyspnea, unspecified: Secondary | ICD-10-CM

## 2019-01-18 MED ORDER — FUROSEMIDE 20 MG PO TABS: 20.0000 mg | ORAL_TABLET | Freq: Every day | ORAL | 1 refills | Status: DC

## 2019-01-19 LAB — CBC WITH DIFFERENTIAL/PLATELET
Basophils Absolute: 0 10*3/uL (ref 0.0–0.2)
Basos: 0 %
EOS (ABSOLUTE): 0.2 10*3/uL (ref 0.0–0.4)
Eos: 2 %
Hematocrit: 37.4 % (ref 34.0–46.6)
Hemoglobin: 13 g/dL (ref 11.1–15.9)
Immature Grans (Abs): 0 10*3/uL (ref 0.0–0.1)
Immature Granulocytes: 0 %
Lymphocytes Absolute: 2.4 10*3/uL (ref 0.7–3.1)
Lymphs: 25 %
MCH: 31.6 pg (ref 26.6–33.0)
MCHC: 34.8 g/dL (ref 31.5–35.7)
MCV: 91 fL (ref 79–97)
Monocytes Absolute: 0.8 10*3/uL (ref 0.1–0.9)
Monocytes: 9 %
Neutrophils Absolute: 6.1 10*3/uL (ref 1.4–7.0)
Neutrophils: 64 %
Platelets: 241 10*3/uL (ref 150–450)
RBC: 4.11 x10E6/uL (ref 3.77–5.28)
RDW: 12.8 % (ref 11.7–15.4)
WBC: 9.6 10*3/uL (ref 3.4–10.8)

## 2019-01-19 LAB — CMP14+EGFR
ALT: 37 IU/L — ABNORMAL HIGH (ref 0–32)
AST: 27 IU/L (ref 0–40)
Albumin/Globulin Ratio: 1.6 (ref 1.2–2.2)
Albumin: 4.3 g/dL (ref 3.8–4.8)
Alkaline Phosphatase: 83 IU/L (ref 39–117)
BUN/Creatinine Ratio: 15 (ref 9–23)
BUN: 11 mg/dL (ref 6–20)
Bilirubin Total: 0.3 mg/dL (ref 0.0–1.2)
CO2: 20 mmol/L (ref 20–29)
Calcium: 9.1 mg/dL (ref 8.7–10.2)
Chloride: 104 mmol/L (ref 96–106)
Creatinine, Ser: 0.71 mg/dL (ref 0.57–1.00)
GFR calc Af Amer: 124 mL/min/{1.73_m2} (ref >59)
GFR calc non Af Amer: 108 mL/min/{1.73_m2} (ref >59)
Globulin, Total: 2.7 g/dL (ref 1.5–4.5)
Glucose: 81 mg/dL (ref 65–99)
Potassium: 4.2 mmol/L (ref 3.5–5.2)
Sodium: 139 mmol/L (ref 134–144)
Total Protein: 7 g/dL (ref 6.0–8.5)

## 2019-01-19 LAB — THYROID PANEL WITH TSH
Free Thyroxine Index: 2.1 (ref 1.2–4.9)
T3 Uptake Ratio: 25 % (ref 24–39)
T4, Total: 8.4 ug/dL (ref 4.5–12.0)
TSH: 1.15 u[IU]/mL (ref 0.450–4.500)

## 2019-01-19 LAB — VITAMIN D 25 HYDROXY (VIT D DEFICIENCY, FRACTURES): Vit D, 25-Hydroxy: 27.3 ng/mL — ABNORMAL LOW (ref 30.0–100.0)

## 2019-01-19 LAB — VITAMIN B12: Vitamin B-12: 319 pg/mL (ref 232–1245)

## 2019-01-19 LAB — SEDIMENTATION RATE: Sed Rate: 26 mm/hr (ref 0–32)

## 2019-01-21 ENCOUNTER — Encounter: Payer: Self-pay | Admitting: Physician Assistant

## 2019-01-22 ENCOUNTER — Encounter: Payer: Self-pay | Admitting: Physician Assistant

## 2019-01-23 NOTE — Progress Notes (Signed)
BP (!) 140/96   Pulse 78   Temp 98.2 F (36.8 C) (Temporal)   Ht _0  (1.575 m)   Wt 216 lb 9.6 oz (98.2 kg)   SpO2 100%   BMI 39.62 kg/m    Subjective:    Patient ID: Kelly Jennings, female    DOB: 12-Feb-1981, 38 y.o.   MRN: [id number]  HPI: Kelly Jennings is a 38 y.o. female presenting on 01/18/2019 for Edema and Hypertension  This patient comes in having significant weakness.  Over the past few weeks she has had increasing leg tired days.  She does have some swelling in her arms and legs.  She has gained weight without any significant changes.  She states at times her arms even it when she is significantly swollen.  Her blood pressure is elevated here today.  She states that when she is been at home her blood pressures are running 1 40-1 50/1 100s.  She states that she does feel thirsty is nothing is quenching her throat.  She states she has not had any significant life changes.  She denies any palpitations or chest pain.  However she is more dyspneic with walking just short distances.  We reviewed all of her medications.   Past Medical History:  Diagnosis Date  . GERD (gastroesophageal reflux disease)   . Hypertension   . Migraines    Relevant past medical, surgical, family and social history reviewed and updated as indicated. Interim medical history since our last visit reviewed. Allergies and medications reviewed and updated. DATA REVIEWED: CHART IN EPIC  Family History reviewed for pertinent findings.  Review of Systems  Constitutional: Positive for fatigue and unexpected weight change. Negative for fever.  HENT: Negative.   Eyes: Negative.   Respiratory: Positive for shortness of breath. Negative for wheezing.   Cardiovascular: Positive for leg swelling. Negative for chest pain.  Gastrointestinal: Negative.   Genitourinary: Negative.     Allergies as of 01/18/2019      Reactions   Penicillins       Medication List       Accurate as of January 18, 2019  11:59 PM. If you have any questions, ask your nurse or doctor.        STOP taking these medications   phentermine 37.5 MG tablet Commonly known as: ADIPEX-P Stopped by: Terald Sleeper, PA-C   predniSONE 10 MG (21) Tbpk tablet Commonly known as: STERAPRED UNI-PAK 21 TAB Stopped by: Terald Sleeper, PA-C   triamterene-hydrochlorothiazide 37.5-25 MG tablet Commonly known as: MAXZIDE-25 Stopped by: Terald Sleeper, PA-C     TAKE these medications   furosemide 20 MG tablet Commonly known as: Lasix Take 1-2 tablets (20-40 mg total) by mouth daily. Started by: Terald Sleeper, PA-C   hydrOXYzine 25 MG capsule Commonly known as: Vistaril Take 1 capsule (25 mg total) by mouth 3 (three) times daily as needed.   topiramate 25 MG tablet Commonly known as: TOPAMAX Take 1 tablet (25 mg total) by mouth daily.          Objective:    BP (!) 140/96   Pulse 78   Temp 98.2 F (36.8 C) (Temporal)   Ht _1  (1.575 m)   Wt 216 lb 9.6 oz (98.2 kg)   SpO2 100%   BMI 39.62 kg/m   Allergies  Allergen Reactions  . Penicillins     Wt Readings from Last 3 Encounters:  01/18/19 216 lb 9.6 oz (98.2  kg)  05/29/18 210 lb 9.6 oz (95.5 kg)  07/25/17 195 lb 9.6 oz (88.7 kg)    Physical Exam Constitutional:      Appearance: She is well-developed.  HENT:     Head: Normocephalic and atraumatic.     Right Ear: Tympanic membrane, ear canal and external ear normal.     Left Ear: Tympanic membrane, ear canal and external ear normal.     Nose: Nose normal. No rhinorrhea.     Mouth/Throat:     Pharynx: No oropharyngeal exudate or posterior oropharyngeal erythema.  Eyes:     Conjunctiva/sclera: Conjunctivae normal.     Pupils: Pupils are equal, round, and reactive to light.  Neck:     Musculoskeletal: Normal range of motion and neck supple.  Cardiovascular:     Rate and Rhythm: Normal rate and regular rhythm.     Heart sounds: Normal heart sounds.  Pulmonary:     Effort: Pulmonary effort is  normal.     Breath sounds: Normal breath sounds.  Abdominal:     General: Bowel sounds are normal.     Palpations: Abdomen is soft.  Musculoskeletal:     Right lower leg: 2+ Edema present.     Left lower leg: 2+ Edema present.  Skin:    General: Skin is warm and dry.     Findings: No rash.  Neurological:     Mental Status: She is alert and oriented to person, place, and time.     Deep Tendon Reflexes: Reflexes are normal and symmetric.  Psychiatric:        Behavior: Behavior normal.        Thought Content: Thought content normal.        Judgment: Judgment normal.     Results for orders placed or performed in visit on 01/18/19  CBC with Differential/Platelet  Result Value Ref Range   WBC 9.6 3.4 - 10.8 x10E3/uL   RBC 4.11 3.77 - 5.28 x10E6/uL   Hemoglobin 13.0 11.1 - 15.9 g/dL   Hematocrit 37.4 34.0 - 46.6 %   MCV 91 79 - 97 fL   MCH 31.6 26.6 - 33.0 pg   MCHC 34.8 31.5 - 35.7 g/dL   RDW 12.8 11.7 - 15.4 %   Platelets 241 150 - 450 x10E3/uL   Neutrophils 64 Not Estab. %   Lymphs 25 Not Estab. %   Monocytes 9 Not Estab. %   Eos 2 Not Estab. %   Basos 0 Not Estab. %   Neutrophils Absolute 6.1 1.4 - 7.0 x10E3/uL   Lymphocytes Absolute 2.4 0.7 - 3.1 x10E3/uL   Monocytes Absolute 0.8 0.1 - 0.9 x10E3/uL   EOS (ABSOLUTE) 0.2 0.0 - 0.4 x10E3/uL   Basophils Absolute 0.0 0.0 - 0.2 x10E3/uL   Immature Granulocytes 0 Not Estab. %   Immature Grans (Abs) 0.0 0.0 - 0.1 x10E3/uL  CMP14+EGFR  Result Value Ref Range   Glucose 81 65 - 99 mg/dL   BUN 11 6 - 20 mg/dL   Creatinine, Ser 0.71 0.57 - 1.00 mg/dL   GFR calc non Af Amer 108 >59 mL/min/1.73   GFR calc Af Amer 124 >59 mL/min/1.73   BUN/Creatinine Ratio 15 9 - 23   Sodium 139 134 - 144 mmol/L   Potassium 4.2 3.5 - 5.2 mmol/L   Chloride 104 96 - 106 mmol/L   CO2 20 20 - 29 mmol/L   Calcium 9.1 8.7 - 10.2 mg/dL   Total Protein 7.0 6.0 - 8.5 g/dL  Albumin 4.3 3.8 - 4.8 g/dL   Globulin, Total 2.7 1.5 - 4.5 g/dL    Albumin/Globulin Ratio 1.6 1.2 - 2.2   Bilirubin Total 0.3 0.0 - 1.2 mg/dL   Alkaline Phosphatase 83 39 - 117 IU/L   AST 27 0 - 40 IU/L   ALT 37 (H) 0 - 32 IU/L  Thyroid Panel With TSH  Result Value Ref Range   TSH 1.150 0.450 - 4.500 uIU/mL   T4, Total 8.4 4.5 - 12.0 ug/dL   T3 Uptake Ratio 25 24 - 39 %   Free Thyroxine Index 2.1 1.2 - 4.9  Vitamin B12  Result Value Ref Range   Vitamin B-12 319 232 - 1,245 pg/mL  VITAMIN D 25 Hydroxy (Vit-D Deficiency, Fractures)  Result Value Ref Range   Vit D, 25-Hydroxy 27.3 (L) 30.0 - 100.0 ng/mL  Sedimentation rate  Result Value Ref Range   Sed Rate 26 0 - 32 mm/hr      Assessment & Plan:   1. Generalized edema - CBC with Differential/Platelet - CMP14+EGFR - Thyroid Panel With TSH - VITAMIN D 25 Hydroxy (Vit-D Deficiency, Fractures) - ECHOCARDIOGRAM COMPLETE; Future  2. Abnormal weight gain - CBC with Differential/Platelet - CMP14+EGFR - Thyroid Panel With TSH - Vitamin B12 - VITAMIN D 25 Hydroxy (Vit-D Deficiency, Fractures) - Sedimentation rate - ECHOCARDIOGRAM COMPLETE; Future  3. Weakness - CBC with Differential/Platelet - CMP14+EGFR - Thyroid Panel With TSH - Vitamin B12 - VITAMIN D 25 Hydroxy (Vit-D Deficiency, Fractures) - Sedimentation rate - ECHOCARDIOGRAM COMPLETE; Future  4. Gastroesophageal reflux disease, esophagitis presence not specified - CBC with Differential/Platelet  5. Pruritus - CBC with Differential/Platelet - CMP14+EGFR - Thyroid Panel With TSH - Vitamin B12 - VITAMIN D 25 Hydroxy (Vit-D Deficiency, Fractures) - Sedimentation rate  6. Dyspnea, unspecified type - ECHOCARDIOGRAM COMPLETE; Future   Continue all other maintenance medications as listed above.  Follow up plan: Return in about 4 weeks (around 02/15/2019).  Educational handout given for [place] PA-C [facility] [address]  [place], [place] [address] [phone]   01/23/2019, 12:19  PM

## 2019-01-24 ENCOUNTER — Encounter: Payer: Self-pay | Admitting: Physician Assistant

## 2019-01-28 ENCOUNTER — Other Ambulatory Visit: Payer: Self-pay | Admitting: Physician Assistant

## 2019-01-28 ENCOUNTER — Telehealth: Payer: Self-pay | Admitting: Physician Assistant

## 2019-01-28 DIAGNOSIS — R601 Generalized edema: Secondary | ICD-10-CM

## 2019-01-28 NOTE — Telephone Encounter (Signed)
I placed another order, but it said there was already on file, can you check on the status? I went ahead and placed it again.

## 2019-01-28 NOTE — Progress Notes (Signed)
echocard

## 2019-01-28 NOTE — Telephone Encounter (Signed)
Please advise if test is to be ordered.

## 2019-01-28 NOTE — Telephone Encounter (Signed)
Aware.  Referral in process. 

## 2019-01-29 ENCOUNTER — Encounter: Payer: Self-pay | Admitting: Physician Assistant

## 2019-01-29 ENCOUNTER — Telehealth: Payer: Self-pay | Admitting: Physician Assistant

## 2019-01-29 NOTE — Telephone Encounter (Signed)
Patient sent mychart message checking on appointment for an echocardiogram. The provider never told referrals there was a procedure put in. Referrals has called Kelly Jennings and she is waiting for them to call her back to set it up. Pt was very upset that the ball was dropped on our part. Pt states they nurse she spoke with on 01/28/19. Told her paper work was lost. Pt was confused on what paper work she was talking about. Explained to patient that she ment the procedure referral. Apologized to patient and sent [employer] gift card in the mail to her home address listed on file.

## 2019-02-04 ENCOUNTER — Other Ambulatory Visit: Payer: Self-pay

## 2019-02-04 ENCOUNTER — Encounter: Payer: Self-pay | Admitting: Physician Assistant

## 2019-02-04 ENCOUNTER — Ambulatory Visit (HOSPITAL_COMMUNITY)
Admission: RE | Admit: 2019-02-04 | Discharge: 2019-02-04 | Disposition: A | Discharge status: Home / Self Care | Payer: BC Managed Care – PPO | Source: Ambulatory Visit | Attending: Physician Assistant | Admitting: Physician Assistant

## 2019-02-04 ENCOUNTER — Other Ambulatory Visit: Payer: Self-pay | Admitting: Physician Assistant

## 2019-02-04 DIAGNOSIS — R635 Abnormal weight gain: Secondary | ICD-10-CM | POA: Insufficient documentation

## 2019-02-04 DIAGNOSIS — R531 Weakness: Secondary | ICD-10-CM

## 2019-02-04 DIAGNOSIS — R0600 Dyspnea, unspecified: Secondary | ICD-10-CM | POA: Insufficient documentation

## 2019-02-04 DIAGNOSIS — L299 Pruritus, unspecified: Secondary | ICD-10-CM

## 2019-02-04 DIAGNOSIS — R601 Generalized edema: Secondary | ICD-10-CM

## 2019-02-04 NOTE — Progress Notes (Signed)
  Echocardiogram 2D Echocardiogram has been performed.  Kelly Jennings 02/04/2019, 12:23 PM

## 2019-02-09 ENCOUNTER — Other Ambulatory Visit: Payer: Self-pay | Admitting: Physician Assistant

## 2019-02-19 ENCOUNTER — Other Ambulatory Visit: Payer: Self-pay

## 2019-02-19 ENCOUNTER — Encounter: Payer: Self-pay | Admitting: Physician Assistant

## 2019-02-19 ENCOUNTER — Encounter: Payer: Self-pay | Admitting: "Endocrinology

## 2019-02-19 ENCOUNTER — Ambulatory Visit: Payer: BC Managed Care – PPO | Admitting: "Endocrinology

## 2019-02-19 VITALS — BP 148/86 | HR 69 | Ht 62.0 in | Wt 217.0 lb

## 2019-02-19 DIAGNOSIS — I10 Essential (primary) hypertension: Secondary | ICD-10-CM | POA: Diagnosis not present

## 2019-02-19 DIAGNOSIS — E559 Vitamin D deficiency, unspecified: Secondary | ICD-10-CM | POA: Diagnosis not present

## 2019-02-19 DIAGNOSIS — E6609 Other obesity due to excess calories: Secondary | ICD-10-CM

## 2019-02-19 DIAGNOSIS — Z6839 Body mass index (BMI) 39.0-39.9, adult: Secondary | ICD-10-CM | POA: Diagnosis not present

## 2019-02-19 DIAGNOSIS — E66812 Obesity, class 2: Secondary | ICD-10-CM | POA: Insufficient documentation

## 2019-02-19 MED ORDER — LISINOPRIL-HYDROCHLOROTHIAZIDE 10-12.5 MG PO TABS: 1.0000 | ORAL_TABLET | Freq: Every day | ORAL | 3 refills | Status: DC

## 2019-02-19 MED ORDER — VITAMIN D3 125 MCG (5000 UT) PO CAPS: 5000.0000 [IU] | ORAL_CAPSULE | Freq: Every day | ORAL | 0 refills | Status: DC

## 2019-02-19 NOTE — Progress Notes (Signed)
Endocrinology Consult Note                                            02/19/2019, 5:43 PM   Subjective:    Patient ID: Kelly Jennings, female    DOB: 1980/10/08, PCP Remus Loffler, PA-C   Past Medical History:  Diagnosis Date  . GERD (gastroesophageal reflux disease)   . Hypertension   . Migraines    Past Surgical History:  Procedure Laterality Date  . CHOLECYSTECTOMY     Social History   Socioeconomic History  . Marital status: Married    Spouse name: Not on file  . Number of children: Not on file  . Years of education: Not on file  . Highest education level: Not on file  Occupational History  . Not on file  Social Needs  . Financial resource strain: Not on file  . Food insecurity    Worry: Not on file    Inability: Not on file  . Transportation needs    Medical: Not on file    Non-medical: Not on file  Tobacco Use  . Smoking status: Never Smoker  . Smokeless tobacco: Never Used  Substance and Sexual Activity  . Alcohol use: No  . Drug use: No  . Sexual activity: Not on file  Lifestyle  . Physical activity    Days per week: Not on file    Minutes per session: Not on file  . Stress: Not on file  Relationships  . Social Musician on phone: Not on file    Gets together: Not on file    Attends religious service: Not on file    Active member of club or organization: Not on file    Attends meetings of clubs or organizations: Not on file    Relationship status: Not on file  Other Topics Concern  . Not on file  Social History Narrative  . Not on file   Family History  Problem Relation Age of Onset  . Hyperlipidemia Mother   . Hypertension Mother   . Hyperlipidemia Father   . Hypertension Father   . Diabetes Father    Outpatient Encounter Medications as of 02/19/2019  Medication Sig  . Cholecalciferol (VITAMIN D3) 125 MCG (5000 UT) CAPS Take 1 capsule (5,000 Units total) by mouth daily.  . furosemide (LASIX) 20 MG tablet TAKE  1-2 TABLETS (20-40 MG TOTAL) BY MOUTH DAILY.  Marland Kitchen lisinopril-hydrochlorothiazide (ZESTORETIC) 10-12.5 MG tablet Take 1 tablet by mouth daily.  . [DISCONTINUED] hydrOXYzine (VISTARIL) 25 MG capsule Take 1 capsule (25 mg total) by mouth 3 (three) times daily as needed.  . [DISCONTINUED] topiramate (TOPAMAX) 25 MG tablet Take 1 tablet (25 mg total) by mouth daily. (Patient not taking: Reported on 01/18/2019)   No facility-administered encounter medications on file as of 02/19/2019.    ALLERGIES: Allergies  Allergen Reactions  . Penicillins     VACCINATION STATUS:  There is no immunization history on file for this patient.  HPI Kelly Jennings is 38 y.o. female who presents today with a medical history as above. she is being seen in consultation for weight management requested by Remus Loffler, PA-C. History is obtained directly from the patient.  She admits that her weight has progressively increased over the last 5 years.  Review of her medical  records indicate she weighed 169 pounds in December 2017, she weighs 217 pounds today in October 2020. She was never diagnosed with diabetes/prediabetes.  She is currently taking only Lasix 40 mg p.o. daily.  She does have several recordings of high blood pressure, not on treatment. She has 2 grown children ages 73 and 19 years old, denies any gestational diabetes.  She denies any family history of thyroid, adrenal, pituitary dysfunction. -She has not tried any specific commercial weight loss programs.  She admits to consumption of sweetened beverages as well as sweet desserts.   Review of Systems  Constitutional: + Progressive weight gain, + fatigue, no subjective hyperthermia, no subjective hypothermia Eyes: no blurry vision, no xerophthalmia ENT: no sore throat, no nodules palpated in throat, no dysphagia/odynophagia, no hoarseness Cardiovascular: no Chest Pain, no Shortness of Breath, no palpitations, no leg swelling Respiratory: no cough,  no shortness of breath Gastrointestinal: no Nausea/Vomiting/Diarhhea Musculoskeletal: no muscle/joint aches Skin: no rashes Neurological: no tremors, no numbness, no tingling, no dizziness Psychiatric: no depression, no anxiety  Objective:    BP (!) 148/86   Pulse 69   Ht 5\' 2"  (1.575 m)   Wt 217 lb (98.4 kg)   BMI 39.69 kg/m   Wt Readings from Last 3 Encounters:  02/19/19 217 lb (98.4 kg)  01/18/19 216 lb 9.6 oz (98.2 kg)  05/29/18 210 lb 9.6 oz (95.5 kg)    Physical Exam  Constitutional:  Body mass index is 39.69 kg/m.,  not in acute distress, normal state of mind Eyes: PERRLA, EOMI, no exophthalmos ENT: moist mucous membranes, no gross thyromegaly, no gross cervical lymphadenopathy Cardiovascular: normal precordial activity, Regular Rate and Rhythm, no Murmur/Rubs/Gallops Respiratory:  adequate breathing efforts, no gross chest deformity, Clear to auscultation bilaterally Gastrointestinal: abdomen soft, Non -tender, No distension, Bowel Sounds present, no gross organomegaly Musculoskeletal: no gross deformities, strength intact in all four extremities Skin: moist, warm, no rashes Neurological: no tremor with outstretched hands, Deep tendon reflexes normal in bilateral lower extremities.  CMP ( most recent) CMP     Component Value Date/Time   NA 139 01/18/2019 1616   K 4.2 01/18/2019 1616   CL 104 01/18/2019 1616   CO2 20 01/18/2019 1616   GLUCOSE 81 01/18/2019 1616   BUN 11 01/18/2019 1616   CREATININE 0.71 01/18/2019 1616   CALCIUM 9.1 01/18/2019 1616   PROT 7.0 01/18/2019 1616   ALBUMIN 4.3 01/18/2019 1616   AST 27 01/18/2019 1616   ALT 37 (H) 01/18/2019 1616   ALKPHOS 83 01/18/2019 1616   BILITOT 0.3 01/18/2019 1616   GFRNONAA 108 01/18/2019 1616   GFRAA 124 01/18/2019 1616    Lipid Panel ( most recent) Lipid Panel     Component Value Date/Time   CHOL 178 02/15/2017 1117   TRIG 114 02/15/2017 1117   HDL 58 02/15/2017 1117   CHOLHDL 3.1 02/15/2017  1117   LDLCALC 97 02/15/2017 1117   LABVLDL 23 02/15/2017 1117      Lab Results  Component Value Date   TSH 1.150 01/18/2019   TSH 0.872 02/15/2017   TSH 0.866 06/13/2013   TSH 0.372 (L) 05/30/2013      Assessment & Plan:   1. Class 2 obesity due to excess calories without serious comorbidity with body mass index (BMI) of 39.0 to 39.9 in adult -Starting from March 31, 2016 her success in weight measurements are 169 pounds, 177 pounds, 190 pounds, 195 pounds, 210 pounds, and 217 pounds today. Her progressive weight  gain is likely due to excessive caloric intake.  She would benefit the most from dietary modification.  Had a long discussion with her about steps that she can take to help her lose weight in a progressive manner. - she  admits there is a room for improvement in her diet and drink choices. -  Suggestion is made for her to avoid simple carbohydrates  from her diet including Cakes, Sweet Desserts / Pastries, Ice Cream, Soda (diet and regular), Sweet Tea, Candies, Chips, Cookies, Sweet Pastries,  Store Bought Juices, Alcohol in Excess of  1-2 drinks a day, Artificial Sweeteners, Coffee Creamer, and "Sugar-free" Products. This will help patient to have stable blood glucose profile and potentially avoid unintended weight gain. She will return in 3 months for reevaluation.  If there is no satisfactory movement, she will be considered for either Qsymia or Saxenda.  2. Essential hypertension, benign -She does have several instances of high blood pressure, today measures 148/86.  She will benefit from early initiation of antihypertensive medication.  I discussed initiated HCTZ/lisinopril 10/12.5 mg p.o. daily.  3. Vitamin D deficiency -She would benefit from vitamin D supplement.  I discussed initiated vitamin D3 5000 units daily for the next 90 days. - she is advised to maintain close follow up with Remus LofflerJones, Angel S, PA-C for primary care needs.   - Time spent with the patient: 45  minutes, of which >50% was spent in obtaining information about her symptoms, reviewing her previous labs/studies,  evaluations, and treatments, counseling her about her obesity, hypertension, vitamin D deficiency, and developing a plan to confirm the diagnosis and long term treatment based on the latest standards of care/guidelines.    Delton Coombesegina D Mower participated in the discussions, expressed understanding, and voiced agreement with the above plans.  All questions were answered to her satisfaction. she is encouraged to contact clinic should she have any questions or concerns prior to her return visit.  Follow up plan: Return in about 3 months (around 05/22/2019) for Next Visit A1c in Office.   Marquis LunchGebre Josephus Harriger, MD [facility] [address] [place], [place] [address] Phone: [phone]  Fax: [phone]     02/19/2019, 5:43 PM  This note was partially dictated with voice recognition software. Similar sounding words can be transcribed inadequately or may not  be corrected upon review.

## 2019-02-19 NOTE — Patient Instructions (Signed)

## 2019-02-19 NOTE — Progress Notes (Signed)
Pt states she has gained 26 lbs in 5 weeks

## 2019-02-22 ENCOUNTER — Other Ambulatory Visit: Payer: Self-pay | Admitting: Physician Assistant

## 2019-02-22 DIAGNOSIS — R531 Weakness: Secondary | ICD-10-CM

## 2019-02-22 DIAGNOSIS — R635 Abnormal weight gain: Secondary | ICD-10-CM

## 2019-02-22 DIAGNOSIS — R601 Generalized edema: Secondary | ICD-10-CM

## 2019-02-28 ENCOUNTER — Other Ambulatory Visit: Payer: Self-pay

## 2019-02-28 ENCOUNTER — Other Ambulatory Visit: Payer: BC Managed Care – PPO

## 2019-02-28 DIAGNOSIS — R635 Abnormal weight gain: Secondary | ICD-10-CM | POA: Diagnosis not present

## 2019-02-28 DIAGNOSIS — R601 Generalized edema: Secondary | ICD-10-CM

## 2019-02-28 DIAGNOSIS — R531 Weakness: Secondary | ICD-10-CM

## 2019-03-01 LAB — MICROALBUMIN / CREATININE URINE RATIO
Creatinine, Urine: 232.2 mg/dL
Microalb/Creat Ratio: 3 mg/g creat (ref 0–29)
Microalbumin, Urine: 7.5 ug/mL

## 2019-03-06 LAB — BASIC METABOLIC PANEL
BUN/Creatinine Ratio: 15 (ref 9–23)
BUN: 13 mg/dL (ref 6–20)
CO2: 20 mmol/L (ref 20–29)
Calcium: 9.4 mg/dL (ref 8.7–10.2)
Chloride: 102 mmol/L (ref 96–106)
Creatinine, Ser: 0.88 mg/dL (ref 0.57–1.00)
GFR calc Af Amer: 96 mL/min/{1.73_m2} (ref >59)
GFR calc non Af Amer: 83 mL/min/{1.73_m2} (ref >59)
Glucose: 98 mg/dL (ref 65–99)
Potassium: 4.4 mmol/L (ref 3.5–5.2)
Sodium: 141 mmol/L (ref 134–144)

## 2019-03-06 LAB — ANA,IFA RA DIAG PNL W/RFLX TIT/PATN
ANA Titer 1: NEGATIVE
Cyclic Citrullin Peptide Ab: 8 units (ref 0–19)
Rheumatoid fact SerPl-aCnc: <10 IU/mL (ref 0.0–13.9)

## 2019-03-06 LAB — C-REACTIVE PROTEIN: CRP: 2 mg/L (ref 0–10)

## 2019-03-06 LAB — INSULIN AND C-PEPTIDE, SERUM
C-Peptide: 4.4 ng/mL (ref 1.1–4.4)
INSULIN: 28.2 u[IU]/mL — ABNORMAL HIGH (ref 2.6–24.9)

## 2019-03-06 LAB — SEDIMENTATION RATE: Sed Rate: 25 mm/hr (ref 0–32)

## 2019-05-08 ENCOUNTER — Other Ambulatory Visit: Payer: Self-pay | Admitting: Physician Assistant

## 2019-05-12 ENCOUNTER — Other Ambulatory Visit: Payer: Self-pay | Admitting: "Endocrinology

## 2019-05-23 ENCOUNTER — Ambulatory Visit: Payer: BC Managed Care – PPO | Admitting: "Endocrinology

## 2019-05-23 ENCOUNTER — Other Ambulatory Visit: Payer: Self-pay

## 2019-05-23 ENCOUNTER — Encounter: Payer: Self-pay | Admitting: "Endocrinology

## 2019-05-23 VITALS — BP 116/83 | HR 80 | Ht 62.0 in | Wt 220.4 lb

## 2019-05-23 DIAGNOSIS — E559 Vitamin D deficiency, unspecified: Secondary | ICD-10-CM

## 2019-05-23 DIAGNOSIS — E6609 Other obesity due to excess calories: Secondary | ICD-10-CM

## 2019-05-23 DIAGNOSIS — I10 Essential (primary) hypertension: Secondary | ICD-10-CM

## 2019-05-23 DIAGNOSIS — Z6839 Body mass index (BMI) 39.0-39.9, adult: Secondary | ICD-10-CM | POA: Diagnosis not present

## 2019-05-23 LAB — POCT GLYCOSYLATED HEMOGLOBIN (HGB A1C): Hemoglobin A1C: 5.5 % (ref 4.0–5.6)

## 2019-05-23 MED ORDER — CVS D3 125 MCG (5000 UT) PO CAPS: ORAL_CAPSULE | ORAL | 0 refills | Status: DC

## 2019-05-23 MED ORDER — BD PEN NEEDLE SHORT U/F 31G X 8 MM MISC: 1.0000 | 3 refills | Status: DC

## 2019-05-23 MED ORDER — LISINOPRIL-HYDROCHLOROTHIAZIDE 10-12.5 MG PO TABS: 1.0000 | ORAL_TABLET | Freq: Every day | ORAL | 3 refills | Status: DC

## 2019-05-23 MED ORDER — SAXENDA 18 MG/3ML ~~LOC~~ SOPN: 3.0000 mg | PEN_INJECTOR | Freq: Every day | SUBCUTANEOUS | 2 refills | Status: DC

## 2019-05-23 NOTE — Patient Instructions (Signed)

## 2019-05-23 NOTE — Progress Notes (Signed)
05/23/2019, 1:08 PM  Endocrinology follow-up note   Subjective:    Patient ID: Kelly Jennings, female    DOB: 1981/03/18, PCP Remus Loffler, PA-C   Past Medical History:  Diagnosis Date  . GERD (gastroesophageal reflux disease)   . Hypertension   . Migraines    Past Surgical History:  Procedure Laterality Date  . CHOLECYSTECTOMY     Social History   Socioeconomic History  . Marital status: Married    Spouse name: Not on file  . Number of children: Not on file  . Years of education: Not on file  . Highest education level: Not on file  Occupational History  . Not on file  Tobacco Use  . Smoking status: Never Smoker  . Smokeless tobacco: Never Used  Substance and Sexual Activity  . Alcohol use: No  . Drug use: No  . Sexual activity: Not on file  Other Topics Concern  . Not on file  Social History Narrative  . Not on file   Social Determinants of Health   Financial Resource Strain:   . Difficulty of Paying Living Expenses: Not on file  Food Insecurity:   . Worried About Programme researcher, broadcasting/film/video in the Last Year: Not on file  . Ran Out of Food in the Last Year: Not on file  Transportation Needs:   . Lack of Transportation (Medical): Not on file  . Lack of Transportation (Non-Medical): Not on file  Physical Activity:   . Days of Exercise per Week: Not on file  . Minutes of Exercise per Session: Not on file  Stress:   . Feeling of Stress : Not on file  Social Connections:   . Frequency of Communication with Friends and Family: Not on file  . Frequency of Social Gatherings with Friends and Family: Not on file  . Attends Religious Services: Not on file  . Active Member of Clubs or Organizations: Not on file  . Attends Banker Meetings: Not on file  . Marital Status: Not on file   Family History  Problem Relation Age of Onset  . Hyperlipidemia Mother   . Hypertension Mother   . Hyperlipidemia  Father   . Hypertension Father   . Diabetes Father    Outpatient Encounter Medications as of 05/23/2019  Medication Sig  . Cholecalciferol ([facility] D3) 125 MCG (5000 UT) capsule TAKE 1 CAPSULE (5,000 UNITS TOTAL) BY MOUTH DAILY.  . furosemide (LASIX) 20 MG tablet TAKE 1-2 TABLETS (20-40 MG TOTAL) BY MOUTH DAILY.  Marland Kitchen Insulin Pen Needle (B-D ULTRAFINE III SHORT PEN) 31G X 8 MM MISC 1 each by Does not apply route as directed.  . Liraglutide -Weight Management (SAXENDA) 18 MG/3ML SOPN Inject 3 mg into the skin daily before breakfast.  . lisinopril-hydrochlorothiazide (ZESTORETIC) 10-12.5 MG tablet Take 1 tablet by mouth daily.  . [DISCONTINUED] CVS D3 125 MCG (5000 UT) capsule TAKE 1 CAPSULE (5,000 UNITS TOTAL) BY MOUTH DAILY.  . [DISCONTINUED] lisinopril-hydrochlorothiazide (ZESTORETIC) 10-12.5 MG tablet Take 1 tablet by mouth daily.   No facility-administered encounter medications on file as of 05/23/2019.   ALLERGIES: Allergies  Allergen Reactions  . Penicillins     VACCINATION STATUS:  There is no  immunization history on file for this patient.  HPI Kelly CoombesRegina D Jennings is 39 y.o. female who presents today with a medical history as above. she is being seen in follow-up after she was seen in consultation for weight management requested by Remus LofflerJones, Angel S, PA-C. History is obtained directly from the patient.  She admits that her weight has progressively increased over the last 5 years.  Review of her medical records indicate she weighed 169 pounds in December 2017, she weighs 220 pounds today in October 2020. She was never diagnosed with diabetes/prediabetes.  Her point-of-care A1c today is 5.5%.  She was initiated on HCTZ/lisinopril during her last visit for hypertension, responded with improvement of her blood pressure to target.  She is also on as needed Lasix.   She has 2 grown children ages 4316 and 40 years old, denies any gestational diabetes.  She denies any family history of thyroid,  adrenal, pituitary dysfunction. -She has not tried any specific commercial weight loss programs.  She admits to consumption of sweetened beverages as well as sweet desserts.   Review of Systems  Constitutional: + Progressive weight gain, + fatigue, no subjective hyperthermia, no subjective hypothermia Eyes: no blurry vision, no xerophthalmia ENT: no sore throat, no nodules palpated in throat, no dysphagia/odynophagia, no hoarseness Cardiovascular: no Chest Pain, no Shortness of Breath, no palpitations, no leg swelling Respiratory: no cough, no shortness of breath Gastrointestinal: no Nausea/Vomiting/Diarhhea Musculoskeletal: no muscle/joint aches Skin: no rashes Neurological: no tremors, no numbness, no tingling, no dizziness Psychiatric: no depression, no anxiety  Objective:    BP 116/83   Pulse 80   Ht 5\' 2"  (1.575 m)   Wt 220 lb 6.4 oz (100 kg)   BMI 40.31 kg/m   Wt Readings from Last 3 Encounters:  05/23/19 220 lb 6.4 oz (100 kg)  02/19/19 217 lb (98.4 kg)  01/18/19 216 lb 9.6 oz (98.2 kg)    Physical Exam  Constitutional:  Body mass index is 40.31 kg/m.,  not in acute distress, normal state of mind Eyes:  EOMI, no exophthalmos Neck: Supple Respiratory: Adequate breathing efforts Musculoskeletal: no gross deformities, strength intact in all four extremities, no gross restriction of joint movements Skin:  no rashes, no hyperemia Neurological: no tremor with outstretched hands.   CMP ( most recent) CMP     Component Value Date/Time   NA 141 02/28/2019 0919   K 4.4 02/28/2019 0919   CL 102 02/28/2019 0919   CO2 20 02/28/2019 0919   GLUCOSE 98 02/28/2019 0919   BUN 13 02/28/2019 0919   CREATININE 0.88 02/28/2019 0919   CALCIUM 9.4 02/28/2019 0919   PROT 7.0 01/18/2019 1616   ALBUMIN 4.3 01/18/2019 1616   AST 27 01/18/2019 1616   ALT 37 (H) 01/18/2019 1616   ALKPHOS 83 01/18/2019 1616   BILITOT 0.3 01/18/2019 1616   GFRNONAA 83 02/28/2019 0919   GFRAA 96  02/28/2019 0919    Lipid Panel ( most recent) Lipid Panel     Component Value Date/Time   CHOL 178 02/15/2017 1117   TRIG 114 02/15/2017 1117   HDL 58 02/15/2017 1117   CHOLHDL 3.1 02/15/2017 1117   LDLCALC 97 02/15/2017 1117   LABVLDL 23 02/15/2017 1117      Lab Results  Component Value Date   TSH 1.150 01/18/2019   TSH 0.872 02/15/2017   TSH 0.866 06/13/2013   TSH 0.372 (L) 05/30/2013    Recent Results (from the past 2160 hour(s))  Insulin and C-Peptide  Status: Abnormal   Collection Time: 02/28/19  9:19 AM  Result Value Ref Range   INSULIN 28.2 (H) 2.6 - 24.9 uIU/mL   C-Peptide 4.4 1.1 - 4.4 ng/mL    Comment: C-Peptide reference interval is for fasting patients.  Sedimentation rate     Status: None   Collection Time: 02/28/19  9:19 AM  Result Value Ref Range   Sed Rate 25 0 - 32 mm/hr  C-reactive protein     Status: None   Collection Time: 02/28/19  9:19 AM  Result Value Ref Range   CRP 2 0 - 10 mg/L  Basic Metabolic Panel     Status: None   Collection Time: 02/28/19  9:19 AM  Result Value Ref Range   Glucose 98 65 - 99 mg/dL   BUN 13 6 - 20 mg/dL   Creatinine, Ser 0.88 0.57 - 1.00 mg/dL   GFR calc non Af Amer 83 >59 mL/min/1.73   GFR calc Af Amer 96 >59 mL/min/1.73   BUN/Creatinine Ratio 15 9 - 23   Sodium 141 134 - 144 mmol/L   Potassium 4.4 3.5 - 5.2 mmol/L   Chloride 102 96 - 106 mmol/L   CO2 20 20 - 29 mmol/L   Calcium 9.4 8.7 - 10.2 mg/dL  ANA,IFA RA Diag Pnl w/rflx Tit/Patn     Status: None   Collection Time: 02/28/19  9:19 AM  Result Value Ref Range   ANA Titer 1 Negative     Comment:                                      Negative   <1:80                                      Borderline  1:80                                      Positive   >1:80    Rhuematoid fact SerPl-aCnc <10.0 0.0 - 42.6 IU/mL   Cyclic Citrullin Peptide Ab 8 0 - 19 units    Comment:                           Negative               <20                           Weak  positive      20 - 39                           Moderate positive  40 - 59                           Strong positive        >59   Microalbumin / creatinine urine ratio     Status: None   Collection Time: 02/28/19  9:49 AM  Result Value Ref Range   Creatinine, Urine 232.2 Not Estab. mg/dL   Microalbumin, Urine 7.5 Not Estab. ug/mL   Microalb/Creat Ratio 3 0 -  29 mg/g creat    Comment:                        Normal:                0 -  29                        Moderately increased: 30 - 300                        Severely increased:       >300   HgB A1c     Status: None   Collection Time: 05/23/19 10:17 AM  Result Value Ref Range   Hemoglobin A1C 5.5 4.0 - 5.6 %   HbA1c POC (<> result, manual entry)     HbA1c, POC (prediabetic range)     HbA1c, POC (controlled diabetic range)       Assessment & Plan:   1. Class 2 obesity due to excess calories without serious comorbidity with body mass index (BMI) of 39.0 to 39.9 in adult -Starting from March 31, 2016 her success in weight measurements are 169 pounds, 177 pounds, 190 pounds, 195 pounds, 210 pounds, and 220 pounds today. Her progressive weight gain is likely due to excessive caloric intake.  She will benefit the most from dietary modification.  Once again, I had a long discussion with her about steps that she can take to help her lose weight in a progressive manner.  - she  admits there is a room for improvement in her diet and drink choices. -  Suggestion is made for her to avoid simple carbohydrates  from her diet including Cakes, Sweet Desserts / Pastries, Ice Cream, Soda (diet and regular), Sweet Tea, Candies, Chips, Cookies, Sweet Pastries,  Store Bought Juices, Alcohol in Excess of  1-2 drinks a day, Artificial Sweeteners, Coffee Creamer, and "Sugar-free" Products. This will help   potentially avoid unintended weight gain.   She would benefit from [place] treatment.  She did not have any plans for pregnancy, reports that her  husband underwent permanent sterilization. I discussed initiated Saxenda 0.6 mg subcutaneously daily to advance by 0.6 mg/week to a maximum of 3 mg subcutaneously daily. She is counseled on side effects and precautions about Saxenda.  2. Essential hypertension -She did respond to initiation of HCTZ/lisinopril 10/12.5 mg p.o. daily.  She is advised to continue.     3. Vitamin D deficiency -She is advised to continue  vitamin D3 5000 units daily for the next 90 days.  - she is advised to maintain close follow up with Remus Loffler, PA-C for primary care needs.   - Time spent on this patient care encounter:  35 min, of which >50% was spent in  counseling and the rest reviewing her  current and  previous labs/studies ( including abstraction from other facilities),  previous treatments,and medications' doses and developing a plan for long-term care based on the latest recommendations for standards of care; and documenting her care.  Kelly Coombes participated in the discussions, expressed understanding, and voiced agreement with the above plans.  All questions were answered to her satisfaction. she is encouraged to contact clinic should she have any questions or concerns prior to her return visit. .  Follow up plan: Return in about 3 months (around 08/21/2019) for Follow up with Pre-visit Labs.   Marquis Lunch, MD [facility] [address] [place], [place] [address] Phone: [phone]  Fax: [phone]     05/23/2019, 1:08 PM  This note was partially dictated with voice recognition software. Similar sounding words can be transcribed inadequately or may not  be corrected upon review.

## 2019-06-08 ENCOUNTER — Other Ambulatory Visit: Payer: Self-pay | Admitting: Physician Assistant

## 2019-07-30 ENCOUNTER — Other Ambulatory Visit: Payer: Self-pay | Admitting: *Deleted

## 2019-07-30 MED ORDER — FUROSEMIDE 20 MG PO TABS: 20.0000 mg | ORAL_TABLET | Freq: Every day | ORAL | 0 refills | Status: DC

## 2019-07-31 DIAGNOSIS — Z23 Encounter for immunization: Secondary | ICD-10-CM | POA: Diagnosis not present

## 2019-08-21 ENCOUNTER — Telehealth: Payer: Self-pay | Admitting: "Endocrinology

## 2019-08-21 ENCOUNTER — Other Ambulatory Visit: Payer: Self-pay | Admitting: "Endocrinology

## 2019-08-21 ENCOUNTER — Other Ambulatory Visit: Payer: Self-pay

## 2019-08-21 DIAGNOSIS — I10 Essential (primary) hypertension: Secondary | ICD-10-CM

## 2019-08-21 DIAGNOSIS — E66812 Obesity, class 2: Secondary | ICD-10-CM

## 2019-08-21 DIAGNOSIS — E6609 Other obesity due to excess calories: Secondary | ICD-10-CM

## 2019-08-21 DIAGNOSIS — R5383 Other fatigue: Secondary | ICD-10-CM

## 2019-08-21 DIAGNOSIS — E559 Vitamin D deficiency, unspecified: Secondary | ICD-10-CM

## 2019-08-23 ENCOUNTER — Ambulatory Visit: Payer: BC Managed Care – PPO | Admitting: "Endocrinology

## 2019-08-28 ENCOUNTER — Other Ambulatory Visit: Payer: Self-pay | Admitting: "Endocrinology

## 2019-11-21 ENCOUNTER — Other Ambulatory Visit: Payer: Self-pay | Admitting: "Endocrinology

## 2019-12-18 ENCOUNTER — Other Ambulatory Visit: Payer: Self-pay | Admitting: "Endocrinology

## 2019-12-19 ENCOUNTER — Telehealth: Payer: Self-pay | Admitting: Physician Assistant

## 2019-12-19 NOTE — Telephone Encounter (Signed)
Pt called stating that she dropped off FMLA paperwork this morning and just wants to make sure it is filled out correctly. Pt says she needs FMLA to help take care of her father [facility]. Says the doctor told them that her father only has 3 months to live. Also said start date of FMLA is 12/06/19. Wants someone to call her if there are any questions.

## 2019-12-24 NOTE — Telephone Encounter (Signed)
Papers filled out & placed on providers desk

## 2020-01-12 ENCOUNTER — Other Ambulatory Visit: Payer: Self-pay | Admitting: "Endocrinology

## 2020-01-13 ENCOUNTER — Other Ambulatory Visit: Payer: Self-pay | Admitting: "Endocrinology

## 2020-01-23 ENCOUNTER — Ambulatory Visit (INDEPENDENT_AMBULATORY_CARE_PROVIDER_SITE_OTHER): Payer: BC Managed Care – PPO | Admitting: Nurse Practitioner

## 2020-01-23 ENCOUNTER — Encounter: Payer: Self-pay | Admitting: Nurse Practitioner

## 2020-01-23 ENCOUNTER — Other Ambulatory Visit: Payer: Self-pay

## 2020-01-23 VITALS — BP 131/83 | HR 81 | Temp 98.3°F | Resp 20 | Ht 62.0 in | Wt 219.0 lb

## 2020-01-23 DIAGNOSIS — I10 Essential (primary) hypertension: Secondary | ICD-10-CM | POA: Diagnosis not present

## 2020-01-23 DIAGNOSIS — K219 Gastro-esophageal reflux disease without esophagitis: Secondary | ICD-10-CM

## 2020-01-23 DIAGNOSIS — F339 Major depressive disorder, recurrent, unspecified: Secondary | ICD-10-CM | POA: Insufficient documentation

## 2020-01-23 MED ORDER — CVS D3 125 MCG (5000 UT) PO CAPS: ORAL_CAPSULE | ORAL | 0 refills | Status: DC

## 2020-01-23 MED ORDER — FUROSEMIDE 20 MG PO TABS: 20.0000 mg | ORAL_TABLET | Freq: Every day | ORAL | 0 refills | Status: DC

## 2020-01-23 MED ORDER — LISINOPRIL-HYDROCHLOROTHIAZIDE 10-12.5 MG PO TABS: 1.0000 | ORAL_TABLET | Freq: Every day | ORAL | 0 refills | Status: DC

## 2020-01-23 NOTE — Patient Instructions (Signed)
Hypertension, Adult Hypertension is another name for high blood pressure. High blood pressure forces your heart to work harder to pump blood. This can cause problems over time. There are two numbers in a blood pressure reading. There is a top number (systolic) over a bottom number (diastolic). It is best to have a blood pressure that is below 120/80. Healthy choices can help lower your blood pressure, or you may need medicine to help lower it. What are the causes? The cause of this condition is not known. Some conditions may be related to high blood pressure. What increases the risk?  Smoking.  Having type 2 diabetes mellitus, high cholesterol, or both.  Not getting enough exercise or physical activity.  Being overweight.  Having too much fat, sugar, calories, or salt (sodium) in your diet.  Drinking too much alcohol.  Having long-term (chronic) kidney disease.  Having a family history of high blood pressure.  Age. Risk increases with age.  Race. You may be at higher risk if you are African American.  Gender. Men are at higher risk than women before age 45. After age 65, women are at higher risk than men.  Having obstructive sleep apnea.  Stress. What are the signs or symptoms?  High blood pressure may not cause symptoms. Very high blood pressure (hypertensive crisis) may cause: ? Headache. ? Feelings of worry or nervousness (anxiety). ? Shortness of breath. ? Nosebleed. ? A feeling of being sick to your stomach (nausea). ? Throwing up (vomiting). ? Changes in how you see. ? Very bad chest pain. ? Seizures. How is this treated?  This condition is treated by making healthy lifestyle changes, such as: ? Eating healthy foods. ? Exercising more. ? Drinking less alcohol.  Your health care provider may prescribe medicine if lifestyle changes are not enough to get your blood pressure under control, and if: ? Your top number is above 130. ? Your bottom number is above  80.  Your personal target blood pressure may vary. Follow these instructions at home: Eating and drinking   If told, follow the DASH eating plan. To follow this plan: ? Fill one half of your plate at each meal with fruits and vegetables. ? Fill one fourth of your plate at each meal with whole grains. Whole grains include whole-wheat pasta, brown rice, and whole-grain bread. ? Eat or drink low-fat dairy products, such as skim milk or low-fat yogurt. ? Fill one fourth of your plate at each meal with low-fat (lean) proteins. Low-fat proteins include fish, chicken without skin, eggs, beans, and tofu. ? Avoid fatty meat, cured and processed meat, or chicken with skin. ? Avoid pre-made or processed food.  Eat less than 1,500 mg of salt each day.  Do not drink alcohol if: ? Your doctor tells you not to drink. ? You are pregnant, may be pregnant, or are planning to become pregnant.  If you drink alcohol: ? Limit how much you use to:  0-1 drink a day for women.  0-2 drinks a day for men. ? Be aware of how much alcohol is in your drink. In the [place] one drink equals one 12 oz bottle of beer (355 mL), one 5 oz glass of wine (148 mL), or one 1 oz glass of hard liquor (44 mL). Lifestyle   Work with your doctor to stay at a healthy weight or to lose weight. Ask your doctor what the best weight is for you.  Get at least 30 minutes of exercise most   days of the week. This may include walking, swimming, or biking.  Get at least 30 minutes of exercise that strengthens your muscles (resistance exercise) at least 3 days a week. This may include lifting weights or doing Pilates.  Do not use any products that contain nicotine or tobacco, such as cigarettes, e-cigarettes, and chewing tobacco. If you need help quitting, ask your doctor.  Check your blood pressure at home as told by your doctor.  Keep all follow-up visits as told by your doctor. This is important. Medicines  Take over-the-counter  and prescription medicines only as told by your doctor. Follow directions carefully.  Do not skip doses of blood pressure medicine. The medicine does not work as well if you skip doses. Skipping doses also puts you at risk for problems.  Ask your doctor about side effects or reactions to medicines that you should watch for. Contact a doctor if you:  Think you are having a reaction to the medicine you are taking.  Have headaches that keep coming back (recurring).  Feel dizzy.  Have swelling in your ankles.  Have trouble with your vision. Get help right away if you:  Get a very bad headache.  Start to feel mixed up (confused).  Feel weak or numb.  Feel faint.  Have very bad pain in your: ? Chest. ? Belly (abdomen).  Throw up more than once.  Have trouble breathing. Summary  Hypertension is another name for high blood pressure.  High blood pressure forces your heart to work harder to pump blood.  For most people, a normal blood pressure is less than 120/80.  Making healthy choices can help lower blood pressure. If your blood pressure does not get lower with healthy choices, you may need to take medicine. This information is not intended to replace advice given to you by your health care provider. Make sure you discuss any questions you have with your health care provider. Document Revised: 12/20/2017 Document Reviewed: 12/20/2017 Elsevier Patient Education  [place]. Gastroesophageal Reflux Disease, Adult Gastroesophageal reflux (GER) happens when acid from the stomach flows up into the tube that connects the mouth and the stomach (esophagus). Normally, food travels down the esophagus and stays in the stomach to be digested. With GER, food and stomach acid sometimes move back up into the esophagus. You may have a disease called gastroesophageal reflux disease (GERD) if the reflux:  Happens often.  Causes frequent or very bad symptoms.  Causes problems such  as damage to the esophagus. When this happens, the esophagus becomes sore and swollen (inflamed). Over time, GERD can make small holes (ulcers) in the lining of the esophagus. What are the causes? This condition is caused by a problem with the muscle between the esophagus and the stomach. When this muscle is weak or not normal, it does not close properly to keep food and acid from coming back up from the stomach. The muscle can be weak because of:  Tobacco use.  Pregnancy.  Having a certain type of hernia (hiatal hernia).  Alcohol use.  Certain foods and drinks, such as coffee, chocolate, onions, and peppermint. What increases the risk? You are more likely to develop this condition if you:  Are overweight.  Have a disease that affects your connective tissue.  Use NSAID medicines. What are the signs or symptoms? Symptoms of this condition include:  Heartburn.  Difficult or painful swallowing.  The feeling of having a lump in the throat.  A bitter taste in the  mouth.  Bad breath.  Having a lot of saliva.  Having an upset or bloated stomach.  Belching.  Chest pain. Different conditions can cause chest pain. Make sure you see your doctor if you have chest pain.  Shortness of breath or noisy breathing (wheezing).  Ongoing (chronic) cough or a cough at night.  Wearing away of the surface of teeth (tooth enamel).  Weight loss. How is this treated? Treatment will depend on how bad your symptoms are. Your doctor may suggest:  Changes to your diet.  Medicine.  Surgery. Follow these instructions at home: Eating and drinking   Follow a diet as told by your doctor. You may need to avoid foods and drinks such as: ? Coffee and tea (with or without caffeine). ? Drinks that contain alcohol. ? Energy drinks and sports drinks. ? Bubbly (carbonated) drinks or sodas. ? Chocolate and cocoa. ? Peppermint and mint flavorings. ? Garlic and onions. ? Horseradish. ? Spicy  and acidic foods. These include peppers, chili powder, curry powder, vinegar, hot sauces, and BBQ sauce. ? Citrus fruit juices and citrus fruits, such as oranges, lemons, and limes. ? Tomato-based foods. These include red sauce, chili, salsa, and pizza with red sauce. ? Fried and fatty foods. These include donuts, french fries, potato chips, and high-fat dressings. ? High-fat meats. These include hot dogs, rib eye steak, sausage, ham, and bacon. ? High-fat dairy items, such as whole milk, butter, and cream cheese.  Eat small meals often. Avoid eating large meals.  Avoid drinking large amounts of liquid with your meals.  Avoid eating meals during the 2-3 hours before bedtime.  Avoid lying down right after you eat.  Do not exercise right after you eat. Lifestyle   Do not use any products that contain nicotine or tobacco. These include cigarettes, e-cigarettes, and chewing tobacco. If you need help quitting, ask your doctor.  Try to lower your stress. If you need help doing this, ask your doctor.  If you are overweight, lose an amount of weight that is healthy for you. Ask your doctor about a safe weight loss goal. General instructions  Pay attention to any changes in your symptoms.  Take over-the-counter and prescription medicines only as told by your doctor. Do not take aspirin, ibuprofen, or other NSAIDs unless your doctor says it is okay.  Wear loose clothes. Do not wear anything tight around your waist.  Raise (elevate) the head of your bed about 6 inches (15 cm).  Avoid bending over if this makes your symptoms worse.  Keep all follow-up visits as told by your doctor. This is important. Contact a doctor if:  You have new symptoms.  You lose weight and you do not know why.  You have trouble swallowing or it hurts to swallow.  You have wheezing or a cough that keeps happening.  Your symptoms do not get better with treatment.  You have a hoarse voice. Get help right  away if:  You have pain in your arms, neck, jaw, teeth, or back.  You feel sweaty, dizzy, or light-headed.  You have chest pain or shortness of breath.  You throw up (vomit) and your throw-up looks like blood or coffee grounds.  You pass out (faint).  Your poop (stool) is bloody or black.  You cannot swallow, drink, or eat. Summary  If a person has gastroesophageal reflux disease (GERD), food and stomach acid move back up into the esophagus and cause symptoms or problems such as damage to the  esophagus.  Treatment will depend on how bad your symptoms are.  Follow a diet as told by your doctor.  Take all medicines only as told by your doctor. This information is not intended to replace advice given to you by your health care provider. Make sure you discuss any questions you have with your health care provider. Document Revised: 10/18/2017 Document Reviewed: 10/18/2017 Elsevier Patient Education  2020 [employer]  

## 2020-01-23 NOTE — Progress Notes (Addendum)
Established Patient Office Visit  Subjective:  Patient ID: Kelly Jennings, female    DOB: Jun 03, 1980  Age: 39 y.o. MRN: [id number]  CC:  Chief Complaint  Patient presents with  . Establish Care    HPI SYD MANGES presents.  For follow up of hypertension. Patient was diagnosed in 09/24/2012. The patient is tolerating the medication well without side effects. Compliance with treatment has been good; including taking medication as directed , maintains a healthy diet and regular exercise regimen , and following up as directed.   GERD, Follow up:  The patient was last seen for GERD 2 years ago. Changes made since that visit include none.  She reports excellent compliance with treatment. She is not having side effects. .   She is NOT experiencing belching, chest pain, cough or difficulty swallowing  -----------------------------------------------------------------------------------------   Depression: Patient complains of depression. She complains of depressed mood. Onset was approximately a few years ago, patient was in remission until the recent passing of her dad 3 weeks ago.  Since that time.  She denies current suicidal and homicidal plan or intent.   Family history significant for no psychiatric illness.Possible organic causes contributing are: none.  Risk factors: negative life event father's death  Previous treatment includes Xanax and . She complains of the following side effects from the treatment: none.     Office Visit from 01/23/2020 in [facility]  PHQ-9 Total Score 16     Past Medical History:  Diagnosis Date  . GERD (gastroesophageal reflux disease)   . Hypertension   . Migraines     Past Surgical History:  Procedure Laterality Date  . CHOLECYSTECTOMY      Family History  Problem Relation Age of Onset  . Hyperlipidemia Mother   . Hypertension Mother   . Hyperlipidemia Father   . Hypertension Father   . Diabetes Father      Social History   Socioeconomic History  . Marital status: Married    Spouse name: Not on file  . Number of children: Not on file  . Years of education: Not on file  . Highest education level: Not on file  Occupational History  . Not on file  Tobacco Use  . Smoking status: Never Smoker  . Smokeless tobacco: Never Used  Vaping Use  . Vaping Use: Never used  Substance and Sexual Activity  . Alcohol use: No  . Drug use: No  . Sexual activity: Not on file  Other Topics Concern  . Not on file  Social History Narrative  . Not on file   Social Determinants of Health                                                                         Outpatient Medications Prior to Visit  Medication Sig Dispense Refill  . Cholecalciferol ([employer] D3) 125 MCG (5000 UT) capsule TAKE 1 CAPSULE (5,000 UNITS TOTAL) BY MOUTH DAILY. 90 capsule 0  . furosemide (LASIX) 20 MG tablet Take 1-2 tablets (20-40 mg total) by mouth daily. (Needs to be seen before next refill) 60 tablet 0  . lisinopril-hydrochlorothiazide (ZESTORETIC) 10-12.5 MG tablet TAKE 1 TABLET BY MOUTH EVERY DAY 30 tablet 0  .  Insulin Pen Needle (B-D ULTRAFINE III SHORT PEN) 31G X 8 MM MISC 1 each by Does not apply route as directed. (Patient not taking: Reported on 01/23/2020) 100 each 3  . Liraglutide -Weight Management (SAXENDA) 18 MG/3ML SOPN Inject 3 mg into the skin daily before breakfast. (Patient not taking: Reported on 01/23/2020) 5 pen 2   No facility-administered medications prior to visit.    Allergies  Allergen Reactions  . Penicillins     ROS Review of Systems  Gastrointestinal: Negative for constipation and nausea.  All other systems reviewed and are negative.     Objective:    Physical Exam Vitals reviewed.  Constitutional:      Appearance: Normal appearance. She is obese.  HENT:     Head: Normocephalic.     Nose: Nose normal.  Eyes:     Conjunctiva/sclera: Conjunctivae normal.   Cardiovascular:     Rate and Rhythm: Normal rate and regular rhythm.     Pulses: Normal pulses.     Heart sounds: Normal heart sounds.  Pulmonary:     Effort: Pulmonary effort is normal.     Breath sounds: Normal breath sounds.  Abdominal:     General: Bowel sounds are normal.  Musculoskeletal:        General: Normal range of motion.     Cervical back: Normal range of motion.  Skin:    General: Skin is warm and dry.  Neurological:     Mental Status: She is alert and oriented to person, place, and time.  Psychiatric:        Mood and Affect: Mood normal.        Behavior: Behavior normal.     BP 131/83   Pulse 81   Temp 98.3 F (36.8 C) (Temporal)   Resp 20   Ht 5\' 2"  (1.575 m)   Wt 219 lb (99.3 kg)   SpO2 98%   BMI 40.06 kg/m  Wt Readings from Last 3 Encounters:  01/23/20 219 lb (99.3 kg)  05/23/19 220 lb 6.4 oz (100 kg)  02/19/19 217 lb (98.4 kg)     Health Maintenance Due  Topic Date Due  . Hepatitis C Screening  Never done  . COVID-19 Vaccine (1) Never done  . HIV Screening  Never done  . TETANUS/TDAP  Never done  . INFLUENZA VACCINE  Never done    There are no preventive care reminders to display for this patient.  Lab Results  Component Value Date   TSH 1.150 01/18/2019   Lab Results  Component Value Date   WBC 9.6 01/18/2019   HGB 13.0 01/18/2019   HCT 37.4 01/18/2019   MCV 91 01/18/2019   PLT 241 01/18/2019   Lab Results  Component Value Date   NA 141 02/28/2019   K 4.4 02/28/2019   CO2 20 02/28/2019   GLUCOSE 98 02/28/2019   BUN 13 02/28/2019   CREATININE 0.88 02/28/2019   BILITOT 0.3 01/18/2019   ALKPHOS 83 01/18/2019   AST 27 01/18/2019   ALT 37 (H) 01/18/2019   PROT 7.0 01/18/2019   ALBUMIN 4.3 01/18/2019   CALCIUM 9.4 02/28/2019   Lab Results  Component Value Date   CHOL 178 02/15/2017   Lab Results  Component Value Date   HDL 58 02/15/2017   Lab Results  Component Value Date   LDLCALC 97 02/15/2017   Lab Results   Component Value Date   TRIG 114 02/15/2017   Lab Results  Component Value Date  CHOLHDL 3.1 02/15/2017   Lab Results  Component Value Date   HGBA1C 5.5 05/23/2019      Assessment & Plan:   Problem List Items Addressed This Visit      Cardiovascular and Mediastinum   HTN (hypertension) - Primary    Patient is a 39 year old female who presents to clinic for follow-up hypertension.  Patient was diagnosed in 2014.  Patient is tolerating medication well without side effects.  Compliance with treatment has been good including taking medication as directed.  She maintains a healthy diet and regular exercise and follows up with directed.  Current medication hydrochlorothiazide 10-12.5 mg daily. Provided education with printed handouts given. Blood pressure is within therapeutic range for patient. Follow-up with worsening or unresolved symptoms.  Refill sent to pharmacy.      Relevant Orders   Lipid Panel   CBC with Differential   Comprehensive metabolic panel   TSH     Digestive   GERD (gastroesophageal reflux disease)    GERD well-controlled on current medication.  No changes necessary.  Patient is currently on Prilosec OTC.  Continue to provide education on diet and exercise.        Other   Depression, recurrent (HCC)    Patient complains of depression, patient was first diagnosed few years ago and was in remission until the passing of her dad 3 weeks ago.  Since that time she has denied any current suicidal and homicidal intent.  She has no family history of psychiatric illness.  Patient is refusing treatment today.  PHQ score-16, patient is not willing to take medication for depression at the moment.  She is willing to try grief counseling and follow-up in 4 weeks. Provided education to patient with printed handouts given.           Follow-up: Return in about 6 months (around 07/22/2020).    Daryll Drown, NP

## 2020-01-23 NOTE — Assessment & Plan Note (Addendum)
Patient complains of depression, patient was first diagnosed few years ago and was in remission until the passing of her dad 3 weeks ago.  Since that time she has denied any current suicidal and homicidal intent.  She has no family history of psychiatric illness.  Patient is refusing treatment today.  PHQ score-16, patient is not willing to take medication for depression at the moment.  She is willing to try grief counseling and follow-up in 4 weeks. Provided education to patient with printed handouts given.  Labs completed today-CBC, CMP, lipids, TSH. Rx refill sent to pharmacy.

## 2020-01-23 NOTE — Assessment & Plan Note (Signed)
Patient is a 39 year old female who presents to clinic for follow-up hypertension.  Patient was diagnosed in 2014.  Patient is tolerating medication well without side effects.  Compliance with treatment has been good including taking medication as directed.  She maintains a healthy diet and regular exercise and follows up with directed.  Current medication hydrochlorothiazide 10-12.5 mg daily. Provided education with printed handouts given. Blood pressure is within therapeutic range for patient. Follow-up with worsening or unresolved symptoms.  Refill sent to pharmacy.

## 2020-01-23 NOTE — Assessment & Plan Note (Signed)
GERD well-controlled on current medication.  No changes necessary.  Patient is currently on Prilosec OTC.  Continue to provide education on diet and exercise.

## 2020-01-24 ENCOUNTER — Other Ambulatory Visit: Payer: BC Managed Care – PPO

## 2020-01-24 DIAGNOSIS — I10 Essential (primary) hypertension: Secondary | ICD-10-CM

## 2020-01-25 LAB — COMPREHENSIVE METABOLIC PANEL
ALT: 39 IU/L — ABNORMAL HIGH (ref 0–32)
AST: 20 IU/L (ref 0–40)
Albumin/Globulin Ratio: 2 (ref 1.2–2.2)
Albumin: 4.3 g/dL (ref 3.8–4.8)
Alkaline Phosphatase: 83 IU/L (ref 44–121)
BUN/Creatinine Ratio: 15 (ref 9–23)
BUN: 12 mg/dL (ref 6–24)
Bilirubin Total: 0.5 mg/dL (ref 0.0–1.2)
CO2: 22 mmol/L (ref 20–29)
Calcium: 9.3 mg/dL (ref 8.7–10.2)
Chloride: 100 mmol/L (ref 96–106)
Creatinine, Ser: 0.82 mg/dL (ref 0.57–1.00)
GFR calc Af Amer: 104 mL/min/{1.73_m2} (ref >59)
GFR calc non Af Amer: 90 mL/min/{1.73_m2} (ref >59)
Globulin, Total: 2.2 g/dL (ref 1.5–4.5)
Glucose: 107 mg/dL — ABNORMAL HIGH (ref 65–99)
Potassium: 4.6 mmol/L (ref 3.5–5.2)
Sodium: 133 mmol/L — ABNORMAL LOW (ref 134–144)
Total Protein: 6.5 g/dL (ref 6.0–8.5)

## 2020-01-25 LAB — CBC WITH DIFFERENTIAL/PLATELET
Basophils Absolute: 0 10*3/uL (ref 0.0–0.2)
Basos: 1 %
EOS (ABSOLUTE): 0.2 10*3/uL (ref 0.0–0.4)
Eos: 3 %
Hematocrit: 38.9 % (ref 34.0–46.6)
Hemoglobin: 13.3 g/dL (ref 11.1–15.9)
Immature Grans (Abs): 0 10*3/uL (ref 0.0–0.1)
Immature Granulocytes: 0 %
Lymphocytes Absolute: 2 10*3/uL (ref 0.7–3.1)
Lymphs: 30 %
MCH: 32 pg (ref 26.6–33.0)
MCHC: 34.2 g/dL (ref 31.5–35.7)
MCV: 94 fL (ref 79–97)
Monocytes Absolute: 0.6 10*3/uL (ref 0.1–0.9)
Monocytes: 9 %
Neutrophils Absolute: 3.8 10*3/uL (ref 1.4–7.0)
Neutrophils: 57 %
Platelets: 282 10*3/uL (ref 150–450)
RBC: 4.16 x10E6/uL (ref 3.77–5.28)
RDW: 12.9 % (ref 11.7–15.4)
WBC: 6.7 10*3/uL (ref 3.4–10.8)

## 2020-01-25 LAB — LIPID PANEL
Chol/HDL Ratio: 5 ratio — ABNORMAL HIGH (ref 0.0–4.4)
Cholesterol, Total: 222 mg/dL — ABNORMAL HIGH (ref 100–199)
HDL: 44 mg/dL (ref >39)
LDL Chol Calc (NIH): 144 mg/dL — ABNORMAL HIGH (ref 0–99)
Triglycerides: 186 mg/dL — ABNORMAL HIGH (ref 0–149)
VLDL Cholesterol Cal: 34 mg/dL (ref 5–40)

## 2020-01-25 LAB — TSH: TSH: 0.78 u[IU]/mL (ref 0.450–4.500)

## 2020-01-26 ENCOUNTER — Other Ambulatory Visit: Payer: Self-pay | Admitting: Nurse Practitioner

## 2020-01-26 DIAGNOSIS — R7309 Other abnormal glucose: Secondary | ICD-10-CM

## 2020-01-28 LAB — HGB A1C W/O EAG: Hgb A1c MFr Bld: 5.9 % — ABNORMAL HIGH (ref 4.8–5.6)

## 2020-01-28 LAB — SPECIMEN STATUS REPORT

## 2020-01-29 ENCOUNTER — Other Ambulatory Visit (INDEPENDENT_AMBULATORY_CARE_PROVIDER_SITE_OTHER): Payer: BC Managed Care – PPO | Admitting: Nurse Practitioner

## 2020-01-29 DIAGNOSIS — R7303 Prediabetes: Secondary | ICD-10-CM | POA: Insufficient documentation

## 2020-01-29 MED ORDER — METFORMIN HCL 500 MG PO TABS: 500.0000 mg | ORAL_TABLET | Freq: Two times a day (BID) | ORAL | 0 refills | Status: DC

## 2020-02-21 ENCOUNTER — Other Ambulatory Visit: Payer: Self-pay | Admitting: Family Medicine

## 2020-02-21 DIAGNOSIS — I10 Essential (primary) hypertension: Secondary | ICD-10-CM

## 2020-03-26 ENCOUNTER — Other Ambulatory Visit: Payer: Self-pay | Admitting: *Deleted

## 2020-03-26 DIAGNOSIS — I10 Essential (primary) hypertension: Secondary | ICD-10-CM

## 2020-03-26 MED ORDER — FUROSEMIDE 20 MG PO TABS: 20.0000 mg | ORAL_TABLET | Freq: Every day | ORAL | 3 refills | Status: DC

## 2020-04-15 ENCOUNTER — Encounter: Payer: Self-pay | Admitting: Nurse Practitioner

## 2020-04-27 ENCOUNTER — Encounter: Payer: Self-pay | Admitting: Nurse Practitioner

## 2020-04-29 ENCOUNTER — Ambulatory Visit: Payer: BC Managed Care – PPO | Admitting: Nurse Practitioner

## 2020-05-04 ENCOUNTER — Encounter: Payer: Self-pay | Admitting: Nurse Practitioner

## 2020-05-08 ENCOUNTER — Ambulatory Visit: Payer: BC Managed Care – PPO | Admitting: Nurse Practitioner

## 2020-05-11 ENCOUNTER — Encounter: Payer: Self-pay | Admitting: Nurse Practitioner

## 2020-05-12 ENCOUNTER — Ambulatory Visit: Payer: BC Managed Care – PPO | Admitting: Nurse Practitioner

## 2020-05-19 ENCOUNTER — Ambulatory Visit (INDEPENDENT_AMBULATORY_CARE_PROVIDER_SITE_OTHER): Payer: BC Managed Care – PPO | Admitting: Nurse Practitioner

## 2020-05-19 ENCOUNTER — Encounter: Payer: Self-pay | Admitting: Nurse Practitioner

## 2020-05-19 ENCOUNTER — Telehealth: Payer: Self-pay

## 2020-05-19 ENCOUNTER — Other Ambulatory Visit: Payer: Self-pay

## 2020-05-19 VITALS — BP 144/92 | HR 68 | Temp 97.7°F | Ht 62.0 in | Wt 227.2 lb

## 2020-05-19 DIAGNOSIS — F339 Major depressive disorder, recurrent, unspecified: Secondary | ICD-10-CM | POA: Diagnosis not present

## 2020-05-19 DIAGNOSIS — R7303 Prediabetes: Secondary | ICD-10-CM

## 2020-05-19 DIAGNOSIS — F419 Anxiety disorder, unspecified: Secondary | ICD-10-CM

## 2020-05-19 DIAGNOSIS — E6609 Other obesity due to excess calories: Secondary | ICD-10-CM | POA: Diagnosis not present

## 2020-05-19 DIAGNOSIS — I10 Essential (primary) hypertension: Secondary | ICD-10-CM

## 2020-05-19 DIAGNOSIS — E8881 Metabolic syndrome: Secondary | ICD-10-CM

## 2020-05-19 DIAGNOSIS — Z6839 Body mass index (BMI) 39.0-39.9, adult: Secondary | ICD-10-CM

## 2020-05-19 DIAGNOSIS — E669 Obesity, unspecified: Secondary | ICD-10-CM

## 2020-05-19 LAB — CBC WITH DIFFERENTIAL/PLATELET
Basophils Absolute: 0.1 10*3/uL (ref 0.0–0.2)
Basos: 1 %
EOS (ABSOLUTE): 0.2 10*3/uL (ref 0.0–0.4)
Eos: 3 %
Hematocrit: 38.4 % (ref 34.0–46.6)
Hemoglobin: 13.1 g/dL (ref 11.1–15.9)
Immature Grans (Abs): 0 10*3/uL (ref 0.0–0.1)
Immature Granulocytes: 0 %
Lymphocytes Absolute: 1.9 10*3/uL (ref 0.7–3.1)
Lymphs: 25 %
MCH: 31.4 pg (ref 26.6–33.0)
MCHC: 34.1 g/dL (ref 31.5–35.7)
MCV: 92 fL (ref 79–97)
Monocytes Absolute: 0.6 10*3/uL (ref 0.1–0.9)
Monocytes: 8 %
Neutrophils Absolute: 4.7 10*3/uL (ref 1.4–7.0)
Neutrophils: 63 %
Platelets: 269 10*3/uL (ref 150–450)
RBC: 4.17 x10E6/uL (ref 3.77–5.28)
RDW: 12.6 % (ref 11.7–15.4)
WBC: 7.4 10*3/uL (ref 3.4–10.8)

## 2020-05-19 LAB — COMPREHENSIVE METABOLIC PANEL
ALT: 30 IU/L (ref 0–32)
AST: 19 IU/L (ref 0–40)
Albumin/Globulin Ratio: 1.6 (ref 1.2–2.2)
Albumin: 4.4 g/dL (ref 3.8–4.8)
Alkaline Phosphatase: 84 IU/L (ref 44–121)
BUN/Creatinine Ratio: 17 (ref 9–23)
BUN: 13 mg/dL (ref 6–24)
Bilirubin Total: 0.5 mg/dL (ref 0.0–1.2)
CO2: 23 mmol/L (ref 20–29)
Calcium: 9.8 mg/dL (ref 8.7–10.2)
Chloride: 101 mmol/L (ref 96–106)
Creatinine, Ser: 0.78 mg/dL (ref 0.57–1.00)
GFR calc Af Amer: 110 mL/min/{1.73_m2} (ref >59)
GFR calc non Af Amer: 95 mL/min/{1.73_m2} (ref >59)
Globulin, Total: 2.7 g/dL (ref 1.5–4.5)
Glucose: 87 mg/dL (ref 65–99)
Potassium: 4.7 mmol/L (ref 3.5–5.2)
Sodium: 139 mmol/L (ref 134–144)
Total Protein: 7.1 g/dL (ref 6.0–8.5)

## 2020-05-19 LAB — TSH: TSH: 1.03 u[IU]/mL (ref 0.450–4.500)

## 2020-05-19 LAB — BAYER DCA HB A1C WAIVED: HB A1C (BAYER DCA - WAIVED): 5.8 % (ref <7.0)

## 2020-05-19 MED ORDER — LISINOPRIL-HYDROCHLOROTHIAZIDE 20-25 MG PO TABS: 1.0000 | ORAL_TABLET | Freq: Every day | ORAL | 3 refills | Status: DC

## 2020-05-19 MED ORDER — ESCITALOPRAM OXALATE 5 MG PO TABS: 5.0000 mg | ORAL_TABLET | Freq: Every day | ORAL | 0 refills | Status: DC

## 2020-05-19 MED ORDER — WEGOVY 0.25 MG/0.5ML ~~LOC~~ SOAJ: 0.2500 mg | SUBCUTANEOUS | 3 refills | Status: DC

## 2020-05-19 NOTE — Patient Instructions (Signed)
http://NIMH.NIH.Gov">  Generalized Anxiety Disorder, Adult Generalized anxiety disorder (GAD) is a mental health condition. Unlike normal worries, anxiety related to GAD is not triggered by a specific event. These worries do not fade or get better with time. GAD interferes with relationships, work, and school. GAD symptoms can vary from mild to severe. People with severe GAD can have intense waves of anxiety with physical symptoms that are similar to panic attacks. What are the causes? The exact cause of GAD is not known, but the following are believed to have an impact:  Differences in natural brain chemicals.  Genes passed down from parents to children.  Differences in the way threats are perceived.  Development during childhood.  Personality. What increases the risk? The following factors may make you more likely to develop this condition:  Being female.  Having a family history of anxiety disorders.  Being very shy.  Experiencing very stressful life events, such as the death of a loved one.  Having a very stressful family environment. What are the signs or symptoms? People with GAD often worry excessively about many things in their lives, such as their health and family. Symptoms may also include:  Mental and emotional symptoms: ? Worrying excessively about natural disasters. ? Fear of being late. ? Difficulty concentrating. ? Fears that others are judging your performance.  Physical symptoms: ? Fatigue. ? Headaches, muscle tension, muscle twitches, trembling, or feeling shaky. ? Feeling like your heart is pounding or beating very fast. ? Feeling out of breath or like you cannot take a deep breath. ? Having trouble falling asleep or staying asleep, or experiencing restlessness. ? Sweating. ? Nausea, diarrhea, or irritable bowel syndrome (IBS).  Behavioral symptoms: ? Experiencing erratic moods or irritability. ? Avoidance of new situations. ? Avoidance of  people. ? Extreme difficulty making decisions. How is this diagnosed? This condition is diagnosed based on your symptoms and medical history. You will also have a physical exam. Your health care provider may perform tests to rule out other possible causes of your symptoms. To be diagnosed with GAD, a person must have anxiety that:  Is out of his or her control.  Affects several different aspects of his or her life, such as work and relationships.  Causes distress that makes him or her unable to take part in normal activities.  Includes at least three symptoms of GAD, such as restlessness, fatigue, trouble concentrating, irritability, muscle tension, or sleep problems. Before your health care provider can confirm a diagnosis of GAD, these symptoms must be present more days than they are not, and they must last for 6 months or longer. How is this treated? This condition may be treated with:  Medicine. Antidepressant medicine is usually prescribed for long-term daily control. Anti-anxiety medicines may be added in severe cases, especially when panic attacks occur.  Talk therapy (psychotherapy). Certain types of talk therapy can be helpful in treating GAD by providing support, education, and guidance. Options include: ? Cognitive behavioral therapy (CBT). People learn coping skills and self-calming techniques to ease their physical symptoms. They learn to identify unrealistic thoughts and behaviors and to replace them with more appropriate thoughts and behaviors. ? Acceptance and commitment therapy (ACT). This treatment teaches people how to be mindful as a way to cope with unwanted thoughts and feelings. ? Biofeedback. This process trains you to manage your body's response (physiological response) through breathing techniques and relaxation methods. You will work with a therapist while machines are used to monitor your physical   symptoms.  Stress management techniques. These include yoga,  meditation, and exercise. A mental health specialist can help determine which treatment is best for you. Some people see improvement with one type of therapy. However, other people require a combination of therapies.   Follow these instructions at home: Lifestyle  Maintain a consistent routine and schedule.  Anticipate stressful situations. Create a plan, and allow extra time to work with your plan.  Practice stress management or self-calming techniques that you have learned from your therapist or your health care provider. General instructions  Take over-the-counter and prescription medicines only as told by your health care provider.  Understand that you are likely to have setbacks. Accept this and be kind to yourself as you persist to take better care of yourself.  Recognize and accept your accomplishments, even if you judge them as small.  Keep all follow-up visits as told by your health care provider. This is important. Contact a health care provider if:  Your symptoms do not get better.  Your symptoms get worse.  You have signs of depression, such as: ? A persistently sad or irritable mood. ? Loss of enjoyment in activities that used to bring you joy. ? Change in weight or eating. ? Changes in sleeping habits. ? Avoiding friends or family members. ? Loss of energy for normal tasks. ? Feelings of guilt or worthlessness. Get help right away if:  You have serious thoughts about hurting yourself or others. If you ever feel like you may hurt yourself or others, or have thoughts about taking your own life, get help right away. Go to your nearest emergency department or:  Call your local emergency services (911 in the [place]  Call a suicide crisis helpline, such as the [place] at (408)299-7992. This is open 24 hours a day in the [place]  Text the Crisis Text Line at 502 660 7807 (in the [place].). Summary  Generalized anxiety disorder (GAD) is a mental  health condition that involves worry that is not triggered by a specific event.  People with GAD often worry excessively about many things in their lives, such as their health and family.  GAD may cause symptoms such as restlessness, trouble concentrating, sleep problems, frequent sweating, nausea, diarrhea, headaches, and trembling or muscle twitching.  A mental health specialist can help determine which treatment is best for you. Some people see improvement with one type of therapy. However, other people require a combination of therapies. This information is not intended to replace advice given to you by your health care provider. Make sure you discuss any questions you have with your health care provider. Document Revised: 01/30/2019 Document Reviewed: 01/30/2019 Elsevier Patient Education  2021 [employer]. [email or web address]">  Depression Screening Depression screening is a tool that your health care provider can use to learn if you have symptoms of depression. Depression is a common condition with many symptoms that are also often found in other conditions. Depression is treatable, but it must first be diagnosed. You may not know that certain feelings, thoughts, and behaviors that you are having can be symptoms of depression. Taking a depression screening test can help you and your health care provider decide if you need more assessment, or if you should be referred to a mental health care provider. What are the screening tests?  You may have a physical exam to see if another condition is affecting your mental health. You may have a blood or urine sample taken during the physical exam.  You  may be interviewed using a screening tool that was developed from research, such as one of these: ? Patient Health Questionnaire (PHQ). This is a set of either 2 or 9 questions. A health care provider who has been trained to score  this screening test uses a guide to assess if your symptoms suggest that you may have depression. ? Hamilton Depression Rating Scale (HAM-D). This is a set of either 17 or 24 questions. You may be asked to take it again during or after your treatment, to see if your depression has gotten better. ? Beck Depression Inventory (BDI). This is a set of 21 multiple choice questions. Your health care provider scores your answers to assess:  Your level of depression, ranging from mild to severe.  Your response to treatment.  Your health care provider may talk with you about your daily activities, such as eating, sleeping, work, and recreation, and ask if you have had any changes in activity.  Your health care provider may ask you to see a mental health specialist, such as a psychiatrist or psychologist, for more evaluation. Who should be screened for depression?  All adults, including adults with a family history of a mental health disorder.  Adolescents who are 59-25 years old.  People who are recovering from a myocardial infarction (MI).  Pregnant women, or women who have given birth.  People who have a long-term (chronic) illness.  Anyone who has been diagnosed with another type of a mental health disorder.  Anyone who has symptoms that could show depression.   What do my results mean? Your health care provider will review the results of your depression screening, physical exam, and lab tests. Positive screens suggest that you may have depression. Screening is the first step in getting the care that you may need. It is up to you to get your screening results. Ask your health care provider, or the department that is doing your screening tests, when your results will be ready. Talk with your health care provider about your results and diagnosis. A diagnosis of depression is made using the Diagnostic and Statistical Manual of Mental Disorders (DSM-V). This is a book that lists the number and type  of symptoms that must be present for a health care provider to give a specific diagnosis.  Your health care provider may work with you to treat your symptoms of depression, or your health care provider may help you find a mental health provider who can assess, diagnose, and treat your depression. Get help right away if:  You have thoughts about hurting yourself or others. If you ever feel like you may hurt yourself or others, or have thoughts about taking your own life, get help right away. You can go to your nearest emergency department or call:  Your local emergency services (911 in the [place]  A suicide crisis helpline, such as the [place] at 5026790373. This is open 24 hours a day. Summary  Depression screening is the first step in getting the help that you may need.  If your screening test shows symptoms of depression (is positive), your health care provider may ask you to see a mental health provider.  Anyone who is age 34 or older should be screened for depression. This information is not intended to replace advice given to you by your health care provider. Make sure you discuss any questions you have with your health care provider. Document Revised: 10/03/2019 Document Reviewed: 10/03/2019 Elsevier Patient Education  2021  [employer].

## 2020-05-19 NOTE — Progress Notes (Addendum)
Established Patient Office Visit  Subjective:  Patient ID: Kelly Jennings, female    DOB: April 14, 1981  Age: 40 y.o. MRN: [id number]  CC:  Chief Complaint  Patient presents with  . Follow-up    mood    HPI Kelly Jennings presents for Depression: Patient complains of depression. She complains of depressed mood. Onset was approximately a few years ago, gradually worsening since that time.  She denies current suicidal and homicidal plan or intent.   Family history significant for no psychiatric illness.Possible organic causes contributing are: none.  Risk factors: negative life event Grief Previous treatment includes Wellbutrin. She complains of the following side effects from the treatment: none.  Anxiety: Patient complains of anxiety disorder.  She has the following symptoms: difficulty concentrating, fatigue, irritable. Onset of symptoms was approximately a few months ago, unchanged since that time. She denies current suicidal and homicidal ideation. Family history significant for no psychiatric illness.Possible organic causes contributing are: none. Risk factors: negative life event grief (loss of parent i.e dad recently Previous treatment includes Wellbutrin.  She complains of the following side effects from the treatment: none.  Prediabetes: Patient is prediabetic.  Is reporting noncompliance with Metformin 500 mg tablet twice daily due to GI side effects.  Patient is not reporting any signs and symptoms of hyper or hypoglycemia.  Patient is not checking blood sugars at home, and trying to maintain a healthy diet with exercise regimen as tolerated.      Past Medical History:  Diagnosis Date  . GERD (gastroesophageal reflux disease)   . Hypertension   . Migraines     Past Surgical History:  Procedure Laterality Date  . CHOLECYSTECTOMY      Family History  Problem Relation Age of Onset  . Hyperlipidemia Mother   . Hypertension Mother   . Hyperlipidemia Father   .  Hypertension Father   . Diabetes Father     Social History   Socioeconomic History  . Marital status: Married    Spouse name: Not on file  . Number of children: Not on file  . Years of education: Not on file  . Highest education level: Not on file  Occupational History  . Not on file  Tobacco Use  . Smoking status: Never Smoker  . Smokeless tobacco: Never Used  Vaping Use  . Vaping Use: Never used  Substance and Sexual Activity  . Alcohol use: No  . Drug use: No  . Sexual activity: Not on file  Other Topics Concern  . Not on file  Social History Narrative  . Not on file   Social Determinants of Health   Financial Resource Strain: Not on file  Food Insecurity: Not on file  Transportation Needs: Not on file  Physical Activity: Not on file  Stress: Not on file  Social Connections: Not on file  Intimate Partner Violence: Not on file    Outpatient Medications Prior to Visit  Medication Sig Dispense Refill  . Cholecalciferol ([employer] D3) 125 MCG (5000 UT) capsule TAKE 1 CAPSULE (5,000 UNITS TOTAL) BY MOUTH DAILY. 90 capsule 0  . furosemide (LASIX) 20 MG tablet Take 1-2 tablets (20-40 mg total) by mouth daily. 60 tablet 3  . Insulin Pen Needle (B-D ULTRAFINE III SHORT PEN) 31G X 8 MM MISC 1 each by Does not apply route as directed. 100 each 3  . Liraglutide -Weight Management (SAXENDA) 18 MG/3ML SOPN Inject 3 mg into the skin daily before breakfast. 5 pen 2  .  lisinopril-hydrochlorothiazide (ZESTORETIC) 10-12.5 MG tablet Take 1 tablet by mouth daily. 90 tablet 0  . metFORMIN (GLUCOPHAGE) 500 MG tablet Take 1 tablet (500 mg total) by mouth 2 (two) times daily with a meal. (Patient not taking: Reported on 05/19/2020) 180 tablet 0   No facility-administered medications prior to visit.    Allergies  Allergen Reactions  . Penicillins     ROS Review of Systems  Constitutional: Negative.   HENT: Negative.   Eyes: Negative.   Gastrointestinal: Negative for abdominal  distention, abdominal pain and diarrhea.  Endocrine: Negative for polydipsia, polyphagia and polyuria.  Musculoskeletal: Negative.   Skin: Negative.   Neurological: Negative.   Psychiatric/Behavioral: Negative for hallucinations, self-injury, sleep disturbance and suicidal ideas. The patient is nervous/anxious. The patient is not hyperactive.   All other systems reviewed and are negative.     Objective:    Physical Exam Vitals reviewed.  Constitutional:      Appearance: Normal appearance. She is obese.  HENT:     Head: Normocephalic.     Nose: Nose normal. No congestion.     Mouth/Throat:     Pharynx: Oropharynx is clear.  Eyes:     Conjunctiva/sclera: Conjunctivae normal.  Cardiovascular:     Rate and Rhythm: Normal rate and regular rhythm.     Pulses: Normal pulses.     Heart sounds: Normal heart sounds.  Pulmonary:     Effort: Pulmonary effort is normal.     Breath sounds: Normal breath sounds.  Abdominal:     General: Bowel sounds are normal.  Musculoskeletal:        General: Normal range of motion.  Skin:    General: Skin is warm.  Neurological:     Mental Status: She is alert and oriented to person, place, and time.  Psychiatric:        Mood and Affect: Mood normal.        Behavior: Behavior normal.     Comments: Anxiety/depression     BP (!) 144/92   Pulse 68   Temp 97.7 F (36.5 C)   Ht 5\' 2"  (1.575 m)   Wt 227 lb 3.2 oz (103.1 kg)   SpO2 99%   BMI 41.56 kg/m  Wt Readings from Last 3 Encounters:  05/19/20 227 lb 3.2 oz (103.1 kg)  01/23/20 219 lb (99.3 kg)  05/23/19 220 lb 6.4 oz (100 kg)     Health Maintenance Due  Topic Date Due  . Hepatitis C Screening  Never done  . HIV Screening  Never done  . TETANUS/TDAP  Never done  . COVID-19 Vaccine (3 - Booster for Moderna series) 02/28/2020    There are no preventive care reminders to display for this patient.  Lab Results  Component Value Date   TSH 0.780 01/24/2020   Lab Results   Component Value Date   WBC 6.7 01/24/2020   HGB 13.3 01/24/2020   HCT 38.9 01/24/2020   MCV 94 01/24/2020   PLT 282 01/24/2020   Lab Results  Component Value Date   NA 133 (L) 01/24/2020   K 4.6 01/24/2020   CO2 22 01/24/2020   GLUCOSE 107 (H) 01/24/2020   BUN 12 01/24/2020   CREATININE 0.82 01/24/2020   BILITOT 0.5 01/24/2020   ALKPHOS 83 01/24/2020   AST 20 01/24/2020   ALT 39 (H) 01/24/2020   PROT 6.5 01/24/2020   ALBUMIN 4.3 01/24/2020   CALCIUM 9.3 01/24/2020   Lab Results  Component Value Date  CHOL 222 (H) 01/24/2020   Lab Results  Component Value Date   HDL 44 01/24/2020   Lab Results  Component Value Date   LDLCALC 144 (H) 01/24/2020   Lab Results  Component Value Date   TRIG 186 (H) 01/24/2020   Lab Results  Component Value Date   CHOLHDL 5.0 (H) 01/24/2020   Lab Results  Component Value Date   HGBA1C 5.8 05/19/2020   GAD 7 : Generalized Anxiety Score 05/19/2020  Nervous, Anxious, on Edge 2  Control/stop worrying 3  Worry too much - different things 3  Trouble relaxing 3  Restless 3  Easily annoyed or irritable 3  Afraid - awful might happen 3  Total GAD 7 Score 20  Anxiety Difficulty Somewhat difficult   Flowsheet Row Office Visit from 05/19/2020 in [facility]  PHQ-9 Total Score 18       Assessment & Plan:   Problem List Items Addressed This Visit      Cardiovascular and Mediastinum   HTN (hypertension)    Blood pressure not controlled.  Changed Zestoretic from 10-12.5 mg tablet by mouth daily to 10-25 mg tablet by mouth daily.  Follow-up in 2 weeks Continue low-sodium diet with exercise regimen. Rx sent to pharmacy.       Relevant Medications   lisinopril-hydrochlorothiazide (ZESTORETIC) 20-25 MG tablet     Other   Class 2 obesity due to excess calories without serious comorbidity with body mass index (BMI) of 39.0 to 39.9 in adult    Patient's BMI today 41.56 kg/mm.  Started patient on wegovy advised  patient to incorporate healthy diet and exercise regimen as tolerated.  Rx sent to pharmacy.       Relevant Medications   Semaglutide-Weight Management (WEGOVY) 0.25 MG/0.5ML SOAJ   Depression, recurrent (HCC)   Relevant Medications   escitalopram (LEXAPRO) 5 MG tablet   Prediabetes - Primary    Patient's A1c 5.8.       Relevant Orders   TSH   Bayer DCA Hb A1c Waived (Completed)   CBC with Differential   Comprehensive metabolic panel   Anxiety   Relevant Medications   escitalopram (LEXAPRO) 5 MG tablet   Abdominal obesity and metabolic syndrome    Patient continues to have difficulty with weight loss, BMI today 41.56 kg/mm with elevated blood pressure 144/95.  Patient reports compliance with blood pressure medication Zestoretic 10-12.5 mg tablet by mouth daily.  Provided education to patient with printed handouts given.  Advised patient to to continue on low calorie diet, weight loss and exercise regimen.  Started patient on will go Wegovy 0.25 milligrams for 4 weeks then increase to 0.5 mg  Follow-up in 4 to 6 weeks.  Rx sent to pharmacy      Relevant Medications   Semaglutide-Weight Management (WEGOVY) 0.25 MG/0.5ML SOAJ      Meds ordered this encounter  Medications  . Semaglutide-Weight Management (WEGOVY) 0.25 MG/0.5ML SOAJ    Sig: Inject 0.25 mg into the skin once a week. For 4 weeks initially, then increase to 0.5 mg every week    Dispense:  2 mL    Refill:  3    Order Specific Question:   Supervising Provider    Answer:   Raliegh Ip [[id number]]  . escitalopram (LEXAPRO) 5 MG tablet    Sig: Take 1 tablet (5 mg total) by mouth daily.    Dispense:  60 tablet    Refill:  0    Order Specific Question:  Supervising Provider    Answer:   Raliegh Ip [[id number]]  . lisinopril-hydrochlorothiazide (ZESTORETIC) 20-25 MG tablet    Sig: Take 1 tablet by mouth daily.    Dispense:  90 tablet    Refill:  3    Order Specific Question:   Supervising Provider     Answer:   Raliegh Ip [[id number]]    Follow-up: Return in about 3 months (around 08/17/2020).    Daryll Drown, NP

## 2020-05-19 NOTE — Telephone Encounter (Signed)
[place] sent through Covermymeds today.  Obesity, BMI   (Key: [id number])  Your demographic data has been sent to [employer] successfully!  [employer] typically takes 5-10 minutes to respond, but it may take a little longer in some cases. You will be notified by email when available. You can also check for an update later by opening this request from your dashboard. Please do not fax or call [employer] to resubmit this request. If you need assistance, please chat with CoverMy[employer]eds or call us at [phone].  If it has been longer than 24 hours, please reach out to [employer].

## 2020-05-19 NOTE — Assessment & Plan Note (Signed)
Patient continues to have difficulty with weight loss, BMI today 41.56 kg/mm with elevated blood pressure 144/95.  Patient reports compliance with blood pressure medication Zestoretic 10-12.5 mg tablet by mouth daily.  Provided education to patient with printed handouts given.  Advised patient to to continue on low calorie diet, weight loss and exercise regimen.  Started patient on will go Wegovy 0.25 milligrams for 4 weeks then increase to 0.5 mg  Follow-up in 4 to 6 weeks.  Rx sent to pharmacy

## 2020-05-19 NOTE — Assessment & Plan Note (Signed)
Patient's A1c 5.8.

## 2020-05-19 NOTE — Assessment & Plan Note (Signed)
Blood pressure not controlled.  Changed Zestoretic from 10-12.5 mg tablet by mouth daily to 10-25 mg tablet by mouth daily.  Follow-up in 2 weeks Continue low-sodium diet with exercise regimen. Rx sent to pharmacy.

## 2020-05-19 NOTE — Assessment & Plan Note (Signed)
Patient's BMI today 41.56 kg/mm.  Started patient on wegovy advised patient to incorporate healthy diet and exercise regimen as tolerated.  Rx sent to pharmacy.

## 2020-05-20 NOTE — Telephone Encounter (Signed)
As long as you remain covered by your prescription drug plan and there are no changes to your plan benefits, this request is approved for the following time period: 05/19/2020 - 12/17/2020 Approvals may be limited as follows:  By dosing limits. Dosing limits may be established in accordance with FDA approved labeling, accepted compendia, evidence-based practice guidelines or your prescription drug plan benefits;  By indication. For some products, coverage may be available for select indications only;  By [employer] Drug Code [facility]). Drug products are identified by unique numerical product identifiers, called NDCs, which identify the manufacturer, strength, dosage form, formulation and package size. Some NDCs may not be covered.  By other limitations imposed by your plan.  Pharmacy aware

## 2020-06-02 ENCOUNTER — Other Ambulatory Visit: Payer: Self-pay | Admitting: "Endocrinology

## 2020-06-02 DIAGNOSIS — K219 Gastro-esophageal reflux disease without esophagitis: Secondary | ICD-10-CM

## 2020-06-15 ENCOUNTER — Other Ambulatory Visit: Payer: Self-pay | Admitting: *Deleted

## 2020-06-15 DIAGNOSIS — F419 Anxiety disorder, unspecified: Secondary | ICD-10-CM

## 2020-06-15 DIAGNOSIS — F339 Major depressive disorder, recurrent, unspecified: Secondary | ICD-10-CM

## 2020-06-15 MED ORDER — ESCITALOPRAM OXALATE 5 MG PO TABS: 5.0000 mg | ORAL_TABLET | Freq: Every day | ORAL | 0 refills | Status: DC

## 2020-06-24 ENCOUNTER — Other Ambulatory Visit: Payer: Self-pay | Admitting: *Deleted

## 2020-06-24 DIAGNOSIS — I10 Essential (primary) hypertension: Secondary | ICD-10-CM

## 2020-06-24 MED ORDER — FUROSEMIDE 20 MG PO TABS: 20.0000 mg | ORAL_TABLET | Freq: Every day | ORAL | 0 refills | Status: DC

## 2020-07-18 ENCOUNTER — Other Ambulatory Visit: Payer: Self-pay | Admitting: "Endocrinology

## 2020-07-18 DIAGNOSIS — K219 Gastro-esophageal reflux disease without esophagitis: Secondary | ICD-10-CM

## 2020-07-20 ENCOUNTER — Other Ambulatory Visit: Payer: Self-pay | Admitting: *Deleted

## 2020-07-20 DIAGNOSIS — F419 Anxiety disorder, unspecified: Secondary | ICD-10-CM

## 2020-07-20 DIAGNOSIS — F339 Major depressive disorder, recurrent, unspecified: Secondary | ICD-10-CM

## 2020-07-20 DIAGNOSIS — E8881 Metabolic syndrome: Secondary | ICD-10-CM

## 2020-07-20 DIAGNOSIS — E669 Obesity, unspecified: Secondary | ICD-10-CM

## 2020-07-20 MED ORDER — WEGOVY 0.25 MG/0.5ML ~~LOC~~ SOAJ
0.2500 mg | SUBCUTANEOUS | 0 refills | Status: DC
Start: 2020-07-20 — End: 2020-08-17

## 2020-07-20 MED ORDER — ESCITALOPRAM OXALATE 5 MG PO TABS: 5.0000 mg | ORAL_TABLET | Freq: Every day | ORAL | 0 refills | Status: DC

## 2020-08-17 ENCOUNTER — Encounter: Payer: Self-pay | Admitting: Nurse Practitioner

## 2020-08-17 ENCOUNTER — Other Ambulatory Visit: Payer: Self-pay

## 2020-08-17 ENCOUNTER — Ambulatory Visit: Payer: BC Managed Care – PPO | Admitting: Nurse Practitioner

## 2020-08-17 VITALS — BP 103/74 | HR 75 | Temp 97.6°F | Ht 62.0 in | Wt 225.0 lb

## 2020-08-17 DIAGNOSIS — I10 Essential (primary) hypertension: Secondary | ICD-10-CM

## 2020-08-17 DIAGNOSIS — F419 Anxiety disorder, unspecified: Secondary | ICD-10-CM

## 2020-08-17 DIAGNOSIS — E669 Obesity, unspecified: Secondary | ICD-10-CM

## 2020-08-17 DIAGNOSIS — R7303 Prediabetes: Secondary | ICD-10-CM

## 2020-08-17 DIAGNOSIS — E8881 Metabolic syndrome: Secondary | ICD-10-CM

## 2020-08-17 DIAGNOSIS — F339 Major depressive disorder, recurrent, unspecified: Secondary | ICD-10-CM

## 2020-08-17 DIAGNOSIS — K219 Gastro-esophageal reflux disease without esophagitis: Secondary | ICD-10-CM | POA: Diagnosis not present

## 2020-08-17 MED ORDER — ESCITALOPRAM OXALATE 5 MG PO TABS
5.0000 mg | ORAL_TABLET | Freq: Every day | ORAL | 0 refills | Status: DC
Start: 2020-08-17 — End: 2021-02-22

## 2020-08-17 MED ORDER — WEGOVY 0.25 MG/0.5ML ~~LOC~~ SOAJ
1.0000 mg | SUBCUTANEOUS | 12 refills | Status: DC
Start: 2020-08-17 — End: 2020-09-29

## 2020-08-17 NOTE — Progress Notes (Signed)
Established Patient Office Visit  Subjective:  Patient ID: Kelly Jennings, female    DOB: Jul 21, 1980  Age: 40 y.o. MRN: [id number]  CC:  Chief Complaint  Patient presents with  . Medical Management of Chronic Issues    HPI Kelly Jennings presents for follow up of hypertension. Patient was diagnosed in 09/24/2012. The patient is tolerating the medication well without side effects. Compliance with treatment has been good; including taking medication as directed , maintains a healthy diet and regular exercise regimen , and following up as directed.  Current blood pressure medication lisinopril-hydrochlorothiazide 20-25 mg tablet by mouth daily.   Prediabetes   Depression: Patient complains of depression. She complains of depressed mood. Onset was approximately 1 year ago, gradually improving since that time.  She denies current suicidal and homicidal plan or intent.   Family history significant for no psychiatric illness.Possible organic causes contributing are: none.  Risk factors: none Previous treatment includes Lexapro. She complains of the following side effects from the treatment: none.   Anxiety: Patient complains of anxiety disorder.  She has the following symptoms: difficulty concentrating, irritable. Onset of symptoms was approximately a few months ago, rapidly improving since that time. She denies current suicidal and homicidal ideation. Family history significant for no psychiatric illness.Possible organic causes contributing are: none. Risk factors: none Previous treatment includes Lexapro.  She complains of the following side effects from the treatment: none.   GERD, Follow up:  The patient was last seen for GERD 3 months ago. Changes made since that visit include none.  She reports good compliance with treatment. She is not having side effects.   She is NOT experiencing belching and eructation, bilious reflux, choking on food, cough or deep pressure at base of  neck  -----------------------------------------------------------------------------------------  Obesity: Patient complains of obesity. Patient cites health as reasons for wanting to lose weight.  Obesity History Weight in late teens: 154 lb. Period of greatest weight gain:227 lb during early adult years Lowest adult weight:  154 Highest adult weight: 227 Amount of time at present weight: 225 lb 3 months   History of Weight Loss Efforts Greatest amount of weight lost: 10 lb  over 6 months  Amount of time that loss was maintained: 6 months Circumstances associated with regain of weight: stress Successful weight loss techniques attempted: Exercise walking Unsuccessful weight loss techniques attempted: Did not try any increase calorie  Current Exercise Habits walking walking  Current Eating Habits Number of regular meals per day: 3  Number of snacking episodes per day: 2  Who shops for food? patient  Who prepares food? patient Who eats with patient? patient and family Binge behavior?: yes  Purge behavior?  no Anorexic behavior? No Eating precipitated by stress?  yes Guilt feelings associated with eating? yes   Other Potential Contributing Factors Use of alcohol: average no drinks/week  Use of medications that may cause weight gain  no History of past abuse?  no Psych History: anxiety and depression  Comorbidities: GERD and hypertension      Past Medical History:  Diagnosis Date  . GERD (gastroesophageal reflux disease)   . Hypertension   . Migraines     Past Surgical History:  Procedure Laterality Date  . CHOLECYSTECTOMY      Family History  Problem Relation Age of Onset  . Hyperlipidemia Mother   . Hypertension Mother   . Hyperlipidemia Father   . Hypertension Father   . Diabetes Father     Social History  Socioeconomic History  . Marital status: Married    Spouse name: Not on file  . Number of children: Not on file  . Years of education: Not on  file  . Highest education level: Not on file  Occupational History  . Not on file  Tobacco Use  . Smoking status: Never Smoker  . Smokeless tobacco: Never Used  Vaping Use  . Vaping Use: Never used  Substance and Sexual Activity  . Alcohol use: No  . Drug use: No  . Sexual activity: Not on file  Other Topics Concern  . Not on file  Social History Narrative  . Not on file   Social Determinants of Health   Financial Resource Strain: Not on file  Food Insecurity: Not on file  Transportation Needs: Not on file  Physical Activity: Not on file  Stress: Not on file  Social Connections: Not on file  Intimate Partner Violence: Not on file    Outpatient Medications Prior to Visit  Medication Sig Dispense Refill  . Cholecalciferol ([employer] D3) 125 MCG (5000 UT) capsule TAKE 1 CAPSULE (5,000 UNITS TOTAL) BY MOUTH DAILY. 90 capsule 0  . escitalopram (LEXAPRO) 5 MG tablet Take 1 tablet (5 mg total) by mouth daily. 90 tablet 0  . furosemide (LASIX) 20 MG tablet Take 1-2 tablets (20-40 mg total) by mouth daily. 60 tablet 0  . Insulin Pen Needle (B-D ULTRAFINE III SHORT PEN) 31G X 8 MM MISC 1 each by Does not apply route as directed. 100 each 3  . lisinopril-hydrochlorothiazide (ZESTORETIC) 20-25 MG tablet Take 1 tablet by mouth daily. 90 tablet 3  . Semaglutide-Weight Management (WEGOVY) 0.25 MG/0.5ML SOAJ Inject 0.25 mg into the skin once a week. For 4 weeks initially, then increase to 0.5 mg every week (Patient not taking: Reported on 08/17/2020) 2 mL 0  . metFORMIN (GLUCOPHAGE) 500 MG tablet Take 1 tablet (500 mg total) by mouth 2 (two) times daily with a meal. (Patient not taking: Reported on 05/19/2020) 180 tablet 0   No facility-administered medications prior to visit.    Allergies  Allergen Reactions  . Penicillins     ROS Review of Systems  Constitutional: Negative.   HENT: Negative.   Eyes: Negative.   Cardiovascular: Negative.   Genitourinary: Negative.   Musculoskeletal:  Negative.   Skin: Negative.   Neurological: Negative.   All other systems reviewed and are negative.     Objective:    Physical Exam Vitals and nursing note reviewed.  Constitutional:      Appearance: Normal appearance.  HENT:     Head: Normocephalic.     Nose: Nose normal.  Eyes:     Conjunctiva/sclera: Conjunctivae normal.  Cardiovascular:     Rate and Rhythm: Normal rate and regular rhythm.  Pulmonary:     Effort: Pulmonary effort is normal.     Breath sounds: Normal breath sounds.  Abdominal:     General: Bowel sounds are normal.  Musculoskeletal:        General: Normal range of motion.     Cervical back: Normal range of motion.  Skin:    General: Skin is warm.  Neurological:     Mental Status: She is alert and oriented to person, place, and time.     BP 103/74   Pulse 75   Temp 97.6 F (36.4 C) (Temporal)   Ht 5\' 2"  (1.575 m)   Wt 225 lb (102.1 kg)   SpO2 98%   BMI 41.15 kg/m  Wt Readings from Last 3 Encounters:  08/17/20 225 lb (102.1 kg)  05/19/20 227 lb 3.2 oz (103.1 kg)  01/23/20 219 lb (99.3 kg)     Health Maintenance Due  Topic Date Due  . Hepatitis C Screening  Never done  . HIV Screening  Never done  . COVID-19 Vaccine (3 - Booster for Moderna series) 02/28/2020    There are no preventive care reminders to display for this patient.  Lab Results  Component Value Date   TSH 1.030 05/19/2020   Lab Results  Component Value Date   WBC 7.4 05/19/2020   HGB 13.1 05/19/2020   HCT 38.4 05/19/2020   MCV 92 05/19/2020   PLT 269 05/19/2020   Lab Results  Component Value Date   NA 139 05/19/2020   K 4.7 05/19/2020   CO2 23 05/19/2020   GLUCOSE 87 05/19/2020   BUN 13 05/19/2020   CREATININE 0.78 05/19/2020   BILITOT 0.5 05/19/2020   ALKPHOS 84 05/19/2020   AST 19 05/19/2020   ALT 30 05/19/2020   PROT 7.1 05/19/2020   ALBUMIN 4.4 05/19/2020   CALCIUM 9.8 05/19/2020   Lab Results  Component Value Date   CHOL 222 (H) 01/24/2020    Lab Results  Component Value Date   HDL 44 01/24/2020   Lab Results  Component Value Date   LDLCALC 144 (H) 01/24/2020   Lab Results  Component Value Date   TRIG 186 (H) 01/24/2020   Lab Results  Component Value Date   CHOLHDL 5.0 (H) 01/24/2020   Lab Results  Component Value Date   HGBA1C 5.8 05/19/2020       Flowsheet Row Office Visit from 08/17/2020 in [facility]  PHQ-9 Total Score 3     GAD 7 : Generalized Anxiety Score 08/17/2020 05/19/2020  Nervous, Anxious, on Edge 1 2  Control/stop worrying 1 3  Worry too much - different things 1 3  Trouble relaxing 1 3  Restless 1 3  Easily annoyed or irritable 1 3  Afraid - awful might happen 0 3  Total GAD 7 Score 6 20  Anxiety Difficulty Not difficult at all Somewhat difficult    Assessment & Plan:   Problem List Items Addressed This Visit      Cardiovascular and Mediastinum   HTN (hypertension) - Primary    Hypertension well controlled on Zestoretic 20-25 mg tablet by mouth daily.  No changes to medication dose.  Patient is not complaining of hyper or hypotension.  Continue low-sodium diet, exercise as tolerated.  Education provided to patient with printed handouts given.  Follow-up in 3 months.  Completed labs-CBC, CMP, lipid panel.  Results pending.      Relevant Orders   CBC with Differential   Comprehensive metabolic panel   Lipid Panel     Digestive   GERD (gastroesophageal reflux disease)    GERD well controlled.  Continue healthy diet and exercise as tolerated.        Other   Depression, recurrent (HCC)    Depression well controlled on 5 mg Lexapro 1 tablet by mouth daily.  Patient is reporting that she is not crying as frequently anymore.  Advised patient and continue to provide education on depression management.  Follow-up with worsening unresolved symptoms.  No changes to current dose.  Rx refill sent to pharmacy  Follow-up in 3 months.      Prediabetes    Completed  CBC results pending.  Advised patient to continue low  calorie/healthy diet and exercise as tolerated.  We will follow-up pending lab results.      Relevant Orders   CBC with Differential   Anxiety    Patient reports anxiety is rapidly improving with 5 mg Lexapro 1 tablet by mouth daily.  Completed GAD-7 with improved numbers from 20-6 in the last 3 months.  No changes to current dose.  Continue to educate patient on anxiety management and follow-up in 3 months.  Rx refill sent to pharmacy.        Abdominal obesity and metabolic syndrome    Patient continues to work on diet and exercise modification.  Patient has lost a total of 2 pounds in the last 3 months.  She continues on Wegovy 0.25 mg p.o. 0.5 mL.  And currently up to 0.5 mg and will follow-up in 3 months.  Rx refill sent to pharmacy.         No orders of the defined types were placed in this encounter.   Follow-up: Return in about 3 months (around 11/16/2020).    Daryll Drown, NP

## 2020-08-17 NOTE — Assessment & Plan Note (Signed)
GERD well controlled.  Continue healthy diet and exercise as tolerated.

## 2020-08-17 NOTE — Assessment & Plan Note (Signed)
Completed CBC results pending.  Advised patient to continue low calorie/healthy diet and exercise as tolerated.  We will follow-up pending lab results.

## 2020-08-17 NOTE — Patient Instructions (Signed)
Follow up in 3 months.   Calorie Counting for Weight Loss Calories are units of energy. Your body needs a certain number of calories from food to keep going throughout the day. When you eat or drink more calories than your body needs, your body stores the extra calories mostly as fat. When you eat or drink fewer calories than your body needs, your body burns fat to get the energy it needs. Calorie counting means keeping track of how many calories you eat and drink each day. Calorie counting can be helpful if you need to lose weight. If you eat fewer calories than your body needs, you should lose weight. Ask your health care provider what a healthy weight is for you. For calorie counting to work, you will need to eat the right number of calories each day to lose a healthy amount of weight per week. A dietitian can help you figure out how many calories you need in a day and will suggest ways to reach your calorie goal.  A healthy amount of weight to lose each week is usually 1-2 lb (0.5-0.9 kg). This usually means that your daily calorie intake should be reduced by 500-750 calories.  Eating 1,200-1,500 calories a day can help most women lose weight.  Eating 1,500-1,800 calories a day can help most men lose weight. What do I need to know about calorie counting? Work with your health care provider or dietitian to determine how many calories you should get each day. To meet your daily calorie goal, you will need to:  Find out how many calories are in each food that you would like to eat. Try to do this before you eat.  Decide how much of the food you plan to eat.  Keep a food log. Do this by writing down what you ate and how many calories it had. To successfully lose weight, it is important to balance calorie counting with a healthy lifestyle that includes regular activity. Where do I find calorie information? The number of calories in a food can be found on a Nutrition Facts label. If a food does  not have a Nutrition Facts label, try to look up the calories online or ask your dietitian for help. Remember that calories are listed per serving. If you choose to have more than one serving of a food, you will have to multiply the calories per serving by the number of servings you plan to eat. For example, the label on a package of bread might say that a serving size is 1 slice and that there are 90 calories in a serving. If you eat 1 slice, you will have eaten 90 calories. If you eat 2 slices, you will have eaten 180 calories.   How do I keep a food log? After each time that you eat, record the following in your food log as soon as possible:  What you ate. Be sure to include toppings, sauces, and other extras on the food.  How much you ate. This can be measured in cups, ounces, or number of items.  How many calories were in each food and drink.  The total number of calories in the food you ate. Keep your food log near you, such as in a pocket-sized notebook or on an app or website on your mobile phone. Some programs will calculate calories for you and show you how many calories you have left to meet your daily goal. What are some portion-control tips?  Know how many  calories are in a serving. This will help you know how many servings you can have of a certain food.  Use a measuring cup to measure serving sizes. You could also try weighing out portions on a kitchen scale. With time, you will be able to estimate serving sizes for some foods.  Take time to put servings of different foods on your favorite plates or in your favorite bowls and cups so you know what a serving looks like.  Try not to eat straight from a food's packaging, such as from a bag or box. Eating straight from the package makes it hard to see how much you are eating and can lead to overeating. Put the amount you would like to eat in a cup or on a plate to make sure you are eating the right portion.  Use smaller plates,  glasses, and bowls for smaller portions and to prevent overeating.  Try not to multitask. For example, avoid watching TV or using your computer while eating. If it is time to eat, sit down at a table and enjoy your food. This will help you recognize when you are full. It will also help you be more mindful of what and how much you are eating. What are tips for following this plan? Reading food labels  Check the calorie count compared with the serving size. The serving size may be smaller than what you are used to eating.  Check the source of the calories. Try to choose foods that are high in protein, fiber, and vitamins, and low in saturated fat, trans fat, and sodium. Shopping  Read nutrition labels while you shop. This will help you make healthy decisions about which foods to buy.  Pay attention to nutrition labels for low-fat or fat-free foods. These foods sometimes have the same number of calories or more calories than the full-fat versions. They also often have added sugar, starch, or salt to make up for flavor that was removed with the fat.  Make a grocery list of lower-calorie foods and stick to it. Cooking  Try to cook your favorite foods in a healthier way. For example, try baking instead of frying.  Use low-fat dairy products. Meal planning  Use more fruits and vegetables. One-half of your plate should be fruits and vegetables.  Include lean proteins, such as chicken, [place], and fish. Lifestyle Each week, aim to do one of the following:  150 minutes of moderate exercise, such as walking.  75 minutes of vigorous exercise, such as running. General information  Know how many calories are in the foods you eat most often. This will help you calculate calorie counts faster.  Find a way of tracking calories that works for you. Get creative. Try different apps or programs if writing down calories does not work for you. What foods should I eat?  Eat nutritious foods. It is  better to have a nutritious, high-calorie food, such as an avocado, than a food with few nutrients, such as a bag of potato chips.  Use your calories on foods and drinks that will fill you up and will not leave you hungry soon after eating. ? Examples of foods that fill you up are nuts and nut butters, vegetables, lean proteins, and high-fiber foods such as whole grains. High-fiber foods are foods with more than 5 g of fiber per serving.  Pay attention to calories in drinks. Low-calorie drinks include water and unsweetened drinks. The items listed above may not be a complete list  of foods and beverages you can eat. Contact a dietitian for more information.   What foods should I limit? Limit foods or drinks that are not good sources of vitamins, minerals, or protein or that are high in unhealthy fats. These include:  Candy.  Other sweets.  Sodas, specialty coffee drinks, alcohol, and juice. The items listed above may not be a complete list of foods and beverages you should avoid. Contact a dietitian for more information. How do I count calories when eating out?  Pay attention to portions. Often, portions are much larger when eating out. Try these tips to keep portions smaller: ? Consider sharing a meal instead of getting your own. ? If you get your own meal, eat only half of it. Before you start eating, ask for a container and put half of your meal into it. ? When available, consider ordering smaller portions from the menu instead of full portions.  Pay attention to your food and drink choices. Knowing the way food is cooked and what is included with the meal can help you eat fewer calories. ? If calories are listed on the menu, choose the lower-calorie options. ? Choose dishes that include vegetables, fruits, whole grains, low-fat dairy products, and lean proteins. ? Choose items that are boiled, broiled, grilled, or steamed. Avoid items that are buttered, battered, fried, or served with  cream sauce. Items labeled as crispy are usually fried, unless stated otherwise. ? Choose water, low-fat milk, unsweetened iced tea, or other drinks without added sugar. If you want an alcoholic beverage, choose a lower-calorie option, such as a glass of wine or light beer. ? Ask for dressings, sauces, and syrups on the side. These are usually high in calories, so you should limit the amount you eat. ? If you want a salad, choose a garden salad and ask for grilled meats. Avoid extra toppings such as bacon, cheese, or fried items. Ask for the dressing on the side, or ask for olive oil and vinegar or lemon to use as dressing.  Estimate how many servings of a food you are given. Knowing serving sizes will help you be aware of how much food you are eating at restaurants. Where to find more information  Centers for Disease Control and Prevention: FootballExhibition.com.br  [place] Department of Agriculture: WrestlingReporter.dk Summary  Calorie counting means keeping track of how many calories you eat and drink each day. If you eat fewer calories than your body needs, you should lose weight.  A healthy amount of weight to lose per week is usually 1-2 lb (0.5-0.9 kg). This usually means reducing your daily calorie intake by 500-750 calories.  The number of calories in a food can be found on a Nutrition Facts label. If a food does not have a Nutrition Facts label, try to look up the calories online or ask your dietitian for help.  Use smaller plates, glasses, and bowls for smaller portions and to prevent overeating.  Use your calories on foods and drinks that will fill you up and not leave you hungry shortly after a meal. This information is not intended to replace advice given to you by your health care provider. Make sure you discuss any questions you have with your health care provider. Document Revised: 05/23/2019 Document Reviewed: 05/23/2019 Elsevier Patient Education  2021 Elsevier  Inc. http://APA.org/depression-guideline"> https://clinicalkey.com"> http://point-of-care.elsevierperformancemanager.com/skills/"> http://point-of-care.elsevierperformancemanager.com">  Managing Depression, Adult Depression is a mental health condition that affects your thoughts, feelings, and actions. Being diagnosed with depression can bring you relief  if you did not know why you have felt or behaved a certain way. It could also leave you feeling overwhelmed with uncertainty about your future. Preparing yourself to manage your symptoms can help you feel more positive about your future. How to manage lifestyle changes Managing stress Stress is your body's reaction to life changes and events, both good and bad. Stress can add to your feelings of depression. Learning to manage your stress can help lessen your feelings of depression. Try some of the following approaches to reducing your stress (stress reduction techniques):  Listen to music that you enjoy and that inspires you.  Try using a meditation app or take a meditation class.  Develop a practice that helps you connect with your spiritual self. Walk in nature, pray, or go to a place of worship.  Do some deep breathing. To do this, inhale slowly through your nose. Pause at the top of your inhale for a few seconds and then exhale slowly, letting your muscles relax.  Practice yoga to help relax and work your muscles. Choose a stress reduction technique that suits your lifestyle and personality. These techniques take time and practice to develop. Set aside 5-15 minutes a day to do them. Therapists can offer training in these techniques. Other things you can do to manage stress include:  Keeping a stress diary.  Knowing your limits and saying no when you think something is too much.  Paying attention to how you react to certain situations. You may not be able to control everything, but you can change your reaction.  Adding humor to your life  by watching funny films or TV shows.  Making time for activities that you enjoy and that relax you.   Medicines Medicines, such as antidepressants, are often a part of treatment for depression.  Talk with your pharmacist or health care provider about all the medicines, supplements, and herbal products that you take, their possible side effects, and what medicines and other products are safe to take together.  Make sure to report any side effects you may have to your health care provider. Relationships Your health care provider may suggest family therapy, couples therapy, or individual therapy as part of your treatment. How to recognize changes Everyone responds differently to treatment for depression. As you recover from depression, you may start to:  Have more interest in doing activities.  Feel less hopeless.  Have more energy.  Overeat less often, or have a better appetite.  Have better mental focus. It is important to recognize if your depression is not getting better or is getting worse. The symptoms you had in the beginning may return, such as:  Tiredness (fatigue) or low energy.  Eating too much or too little.  Sleeping too much or too little.  Feeling restless, agitated, or hopeless.  Trouble focusing or making decisions.  Unexplained physical complaints.  Feeling irritable, angry, or aggressive. If you or your family members notice these symptoms coming back, let your health care provider know right away. Follow these instructions at home: Activity  Try to get some form of exercise each day, such as walking, biking, swimming, or lifting weights.  Practice stress reduction techniques.  Engage your mind by taking a class or doing some volunteer work.   Lifestyle  Get the right amount and quality of sleep.  Cut down on using caffeine, tobacco, alcohol, and other potentially harmful substances.  Eat a healthy diet that includes plenty of vegetables, fruits,  whole grains, low-fat dairy  products, and lean protein. Do not eat a lot of foods that are high in solid fats, added sugars, or salt (sodium). General instructions  Take over-the-counter and prescription medicines only as told by your health care provider.  Keep all follow-up visits as told by your health care provider. This is important. Where to find support Talking to others Friends and family members can be sources of support and guidance. Talk to trusted friends or family members about your condition. Explain your symptoms to them, and let them know that you are working with a health care provider to treat your depression. Tell friends and family members how they also can be helpful.   Finances  Find appropriate mental health providers that fit with your financial situation.  Talk with your health care provider about options to get reduced prices on your medicines. Where to find more information You can find support in your area from:  Anxiety and Depression Association of America (ADAA): www.adaa.org  Mental Health America: www.mentalhealthamerica.net  [employer] on Mental Illness: www.nami.org Contact a health care provider if:  You stop taking your antidepressant medicines, and you have any of these symptoms: ? Nausea. ? Headache. ? Light-headedness. ? Chills and body aches. ? Not being able to sleep (insomnia).  You or your friends and family think your depression is getting worse. Get help right away if:  You have thoughts of hurting yourself or others. If you ever feel like you may hurt yourself or others, or have thoughts about taking your own life, get help right away. Go to your nearest emergency department or:  Call your local emergency services (911 in the [place]  Call a suicide crisis helpline, such as the National Suicide Prevention Lifeline at 818-621-02851-(249)066-1835. This is open 24 hours a day in the [place]  Text the Crisis Text Line at 253-131-4585741741 (in the  [place] Summary  If you are diagnosed with depression, preparing yourself to manage your symptoms is a good way to feel positive about your future.  Work with your health care provider on a management plan that includes stress reduction techniques, medicines (if applicable), therapy, and healthy lifestyle habits.  Keep talking with your health care provider about how your treatment is working.  If you have thoughts about taking your own life, call a suicide crisis helpline or text a crisis text line. This information is not intended to replace advice given to you by your health care provider. Make sure you discuss any questions you have with your health care provider. Document Revised: 02/20/2019 Document Reviewed: 02/20/2019 Elsevier Patient Education  2021 Elsevier Inc. Managing Anxiety, Adult After being diagnosed with an anxiety disorder, you may be relieved to know why you have felt or behaved a certain way. You may also feel overwhelmed about the treatment ahead and what it will mean for your life. With care and support, you can manage this condition and recover from it. How to manage lifestyle changes Managing stress and anxiety Stress is your body's reaction to life changes and events, both good and bad. Most stress will last just a few hours, but stress can be ongoing and can lead to more than just stress. Although stress can play a major role in anxiety, it is not the same as anxiety. Stress is usually caused by something external, such as a deadline, test, or competition. Stress normally passes after the triggering event has ended.  Anxiety is caused by something internal, such as imagining a terrible outcome or worrying that  something will go wrong that will devastate you. Anxiety often does not go away even after the triggering event is over, and it can become long-term (chronic) worry. It is important to understand the differences between stress and anxiety and to manage your stress  effectively so that it does not lead to an anxious response. Talk with your health care provider or a counselor to learn more about reducing anxiety and stress. He or she may suggest tension reduction techniques, such as:  Music therapy. This can include creating or listening to music that you enjoy and that inspires you.  Mindfulness-based meditation. This involves being aware of your normal breaths while not trying to control your breathing. It can be done while sitting or walking.  Centering prayer. This involves focusing on a word, phrase, or sacred image that means something to you and brings you peace.  Deep breathing. To do this, expand your stomach and inhale slowly through your nose. Hold your breath for 3-5 seconds. Then exhale slowly, letting your stomach muscles relax.  Self-talk. This involves identifying thought patterns that lead to anxiety reactions and changing those patterns.  Muscle relaxation. This involves tensing muscles and then relaxing them. Choose a tension reduction technique that suits your lifestyle and personality. These techniques take time and practice. Set aside 5-15 minutes a day to do them. Therapists can offer counseling and training in these techniques. The training to help with anxiety may be covered by some insurance plans. Other things you can do to manage stress and anxiety include:  Keeping a stress/anxiety diary. This can help you learn what triggers your reaction and then learn ways to manage your response.  Thinking about how you react to certain situations. You may not be able to control everything, but you can control your response.  Making time for activities that help you relax and not feeling guilty about spending your time in this way.  Visual imagery and yoga can help you stay calm and relax.   Medicines Medicines can help ease symptoms. Medicines for anxiety include:  Anti-anxiety drugs.  Antidepressants. Medicines are often used as a  primary treatment for anxiety disorder. Medicines will be prescribed by a health care provider. When used together, medicines, psychotherapy, and tension reduction techniques may be the most effective treatment. Relationships Relationships can play a big part in helping you recover. Try to spend more time connecting with trusted friends and family members. Consider going to couples counseling, taking family education classes, or going to family therapy. Therapy can help you and others better understand your condition. How to recognize changes in your anxiety Everyone responds differently to treatment for anxiety. Recovery from anxiety happens when symptoms decrease and stop interfering with your daily activities at home or work. This may mean that you will start to:  Have better concentration and focus. Worry will interfere less in your daily thinking.  Sleep better.  Be less irritable.  Have more energy.  Have improved memory. It is important to recognize when your condition is getting worse. Contact your health care provider if your symptoms interfere with home or work and you feel like your condition is not improving. Follow these instructions at home: Activity  Exercise. Most adults should do the following: ? Exercise for at least 150 minutes each week. The exercise should increase your heart rate and make you sweat (moderate-intensity exercise). ? Strengthening exercises at least twice a week.  Get the right amount and quality of sleep. Most adults need 7-9  hours of sleep each night. Lifestyle  Eat a healthy diet that includes plenty of vegetables, fruits, whole grains, low-fat dairy products, and lean protein. Do not eat a lot of foods that are high in solid fats, added sugars, or salt.  Make choices that simplify your life.  Do not use any products that contain nicotine or tobacco, such as cigarettes, e-cigarettes, and chewing tobacco. If you need help quitting, ask your health  care provider.  Avoid caffeine, alcohol, and certain over-the-counter cold medicines. These may make you feel worse. Ask your pharmacist which medicines to avoid.   General instructions  Take over-the-counter and prescription medicines only as told by your health care provider.  Keep all follow-up visits as told by your health care provider. This is important. Where to find support You can get help and support from these sources:  Self-help groups.  Online and [employer].  A trusted spiritual leader.  Couples counseling.  Family education classes.  Family therapy. Where to find more information You may find that joining a support group helps you deal with your anxiety. The following sources can help you locate counselors or support groups near you:  Mental Health America: www.mentalhealthamerica.net  Anxiety and Depression Association of [place] (ADAA): ProgramCam.de  [employer] on Mental Illness (NAMI): www.nami.org Contact a health care provider if you:  Have a hard time staying focused or finishing daily tasks.  Spend many hours a day feeling worried about everyday life.  Become exhausted by worry.  Start to have headaches, feel tense, or have nausea.  Urinate more than normal.  Have diarrhea. Get help right away if you have:  A racing heart and shortness of breath.  Thoughts of hurting yourself or others. If you ever feel like you may hurt yourself or others, or have thoughts about taking your own life, get help right away. You can go to your nearest emergency department or call:  Your local emergency services (911 in the [place]  A suicide crisis helpline, such as the National Suicide Prevention Lifeline at 564-872-6690. This is open 24 hours a day. Summary  Taking steps to learn and use tension reduction techniques can help calm you and help prevent triggering an anxiety reaction.  When used together, medicines, psychotherapy, and  tension reduction techniques may be the most effective treatment.  Family, friends, and partners can play a big part in helping you recover from an anxiety disorder. This information is not intended to replace advice given to you by your health care provider. Make sure you discuss any questions you have with your health care provider. Document Revised: 09/11/2018 Document Reviewed: 09/11/2018 Elsevier Patient Education  2021 Elsevier Inc. Hypertension, Adult Hypertension is another name for high blood pressure. High blood pressure forces your heart to work harder to pump blood. This can cause problems over time. There are two numbers in a blood pressure reading. There is a top number (systolic) over a bottom number (diastolic). It is best to have a blood pressure that is below 120/80. Healthy choices can help lower your blood pressure, or you may need medicine to help lower it. What are the causes? The cause of this condition is not known. Some conditions may be related to high blood pressure. What increases the risk?  Smoking.  Having type 2 diabetes mellitus, high cholesterol, or both.  Not getting enough exercise or physical activity.  Being overweight.  Having too much fat, sugar, calories, or salt (sodium) in your diet.  Drinking too  much alcohol.  Having long-term (chronic) kidney disease.  Having a family history of high blood pressure.  Age. Risk increases with age.  Race. You may be at higher risk if you are African American.  Gender. Men are at higher risk than women before age 46. After age 21, women are at higher risk than men.  Having obstructive sleep apnea.  Stress. What are the signs or symptoms?  High blood pressure may not cause symptoms. Very high blood pressure (hypertensive crisis) may cause: ? Headache. ? Feelings of worry or nervousness (anxiety). ? Shortness of breath. ? Nosebleed. ? A feeling of being sick to your stomach (nausea). ? Throwing up  (vomiting). ? Changes in how you see. ? Very bad chest pain. ? Seizures. How is this treated?  This condition is treated by making healthy lifestyle changes, such as: ? Eating healthy foods. ? Exercising more. ? Drinking less alcohol.  Your health care provider may prescribe medicine if lifestyle changes are not enough to get your blood pressure under control, and if: ? Your top number is above 130. ? Your bottom number is above 80.  Your personal target blood pressure may vary. Follow these instructions at home: Eating and drinking  If told, follow the DASH eating plan. To follow this plan: ? Fill one half of your plate at each meal with fruits and vegetables. ? Fill one fourth of your plate at each meal with whole grains. Whole grains include whole-wheat pasta, brown rice, and whole-grain bread. ? Eat or drink low-fat dairy products, such as skim milk or low-fat yogurt. ? Fill one fourth of your plate at each meal with low-fat (lean) proteins. Low-fat proteins include fish, chicken without skin, eggs, beans, and tofu. ? Avoid fatty meat, cured and processed meat, or chicken with skin. ? Avoid pre-made or processed food.  Eat less than 1,500 mg of salt each day.  Do not drink alcohol if: ? Your doctor tells you not to drink. ? You are pregnant, may be pregnant, or are planning to become pregnant.  If you drink alcohol: ? Limit how much you use to:  0-1 drink a day for women.  0-2 drinks a day for men. ? Be aware of how much alcohol is in your drink. In the [place] one drink equals one 12 oz bottle of beer (355 mL), one 5 oz glass of wine (148 mL), or one 1 oz glass of hard liquor (44 mL).   Lifestyle  Work with your doctor to stay at a healthy weight or to lose weight. Ask your doctor what the best weight is for you.  Get at least 30 minutes of exercise most days of the week. This may include walking, swimming, or biking.  Get at least 30 minutes of exercise that  strengthens your muscles (resistance exercise) at least 3 days a week. This may include lifting weights or doing Pilates.  Do not use any products that contain nicotine or tobacco, such as cigarettes, e-cigarettes, and chewing tobacco. If you need help quitting, ask your doctor.  Check your blood pressure at home as told by your doctor.  Keep all follow-up visits as told by your doctor. This is important.   Medicines  Take over-the-counter and prescription medicines only as told by your doctor. Follow directions carefully.  Do not skip doses of blood pressure medicine. The medicine does not work as well if you skip doses. Skipping doses also puts you at risk for problems.  Ask your  doctor about side effects or reactions to medicines that you should watch for. Contact a doctor if you:  Think you are having a reaction to the medicine you are taking.  Have headaches that keep coming back (recurring).  Feel dizzy.  Have swelling in your ankles.  Have trouble with your vision. Get help right away if you:  Get a very bad headache.  Start to feel mixed up (confused).  Feel weak or numb.  Feel faint.  Have very bad pain in your: ? Chest. ? Belly (abdomen).  Throw up more than once.  Have trouble breathing. Summary  Hypertension is another name for high blood pressure.  High blood pressure forces your heart to work harder to pump blood.  For most people, a normal blood pressure is less than 120/80.  Making healthy choices can help lower blood pressure. If your blood pressure does not get lower with healthy choices, you may need to take medicine. This information is not intended to replace advice given to you by your health care provider. Make sure you discuss any questions you have with your health care provider. Document Revised: 12/20/2017 Document Reviewed: 12/20/2017 Elsevier Patient Education  2021 [employer].

## 2020-08-17 NOTE — Assessment & Plan Note (Signed)
Depression well controlled on 5 mg Lexapro 1 tablet by mouth daily.  Patient is reporting that she is not crying as frequently anymore.  Advised patient and continue to provide education on depression management.  Follow-up with worsening unresolved symptoms.  No changes to current dose.  Rx refill sent to pharmacy  Follow-up in 3 months.

## 2020-08-17 NOTE — Assessment & Plan Note (Signed)
Patient reports anxiety is rapidly improving with 5 mg Lexapro 1 tablet by mouth daily.  Completed GAD-7 with improved numbers from 20-6 in the last 3 months.  No changes to current dose.  Continue to educate patient on anxiety management and follow-up in 3 months.  Rx refill sent to pharmacy.

## 2020-08-17 NOTE — Assessment & Plan Note (Signed)
Hypertension well controlled on Zestoretic 20-25 mg tablet by mouth daily.  No changes to medication dose.  Patient is not complaining of hyper or hypotension.  Continue low-sodium diet, exercise as tolerated.  Education provided to patient with printed handouts given.  Follow-up in 3 months.  Completed labs-CBC, CMP, lipid panel.  Results pending.

## 2020-08-17 NOTE — Assessment & Plan Note (Signed)
Patient continues to work on diet and exercise modification.  Patient has lost a total of 2 pounds in the last 3 months.  She continues on Wegovy 0.25 mg p.o. 0.5 mL.  And currently up to 0.5 mg and will follow-up in 3 months.  Rx refill sent to pharmacy.

## 2020-08-18 LAB — COMPREHENSIVE METABOLIC PANEL
ALT: 35 IU/L — ABNORMAL HIGH (ref 0–32)
AST: 19 IU/L (ref 0–40)
Albumin/Globulin Ratio: 1.7 (ref 1.2–2.2)
Albumin: 4.4 g/dL (ref 3.8–4.8)
Alkaline Phosphatase: 86 IU/L (ref 44–121)
BUN/Creatinine Ratio: 12 (ref 9–23)
BUN: 11 mg/dL (ref 6–24)
Bilirubin Total: 0.4 mg/dL (ref 0.0–1.2)
CO2: 24 mmol/L (ref 20–29)
Calcium: 9.4 mg/dL (ref 8.7–10.2)
Chloride: 98 mmol/L (ref 96–106)
Creatinine, Ser: 0.89 mg/dL (ref 0.57–1.00)
Globulin, Total: 2.6 g/dL (ref 1.5–4.5)
Glucose: 104 mg/dL — ABNORMAL HIGH (ref 65–99)
Potassium: 4.2 mmol/L (ref 3.5–5.2)
Sodium: 137 mmol/L (ref 134–144)
Total Protein: 7 g/dL (ref 6.0–8.5)
eGFR: 84 mL/min/{1.73_m2} (ref >59)

## 2020-08-18 LAB — CBC WITH DIFFERENTIAL/PLATELET
Basophils Absolute: 0.1 10*3/uL (ref 0.0–0.2)
Basos: 1 %
EOS (ABSOLUTE): 0.3 10*3/uL (ref 0.0–0.4)
Eos: 4 %
Hematocrit: 39.9 % (ref 34.0–46.6)
Hemoglobin: 13.4 g/dL (ref 11.1–15.9)
Immature Grans (Abs): 0 10*3/uL (ref 0.0–0.1)
Immature Granulocytes: 1 %
Lymphocytes Absolute: 2.1 10*3/uL (ref 0.7–3.1)
Lymphs: 24 %
MCH: 31.4 pg (ref 26.6–33.0)
MCHC: 33.6 g/dL (ref 31.5–35.7)
MCV: 93 fL (ref 79–97)
Monocytes Absolute: 0.8 10*3/uL (ref 0.1–0.9)
Monocytes: 9 %
Neutrophils Absolute: 5.3 10*3/uL (ref 1.4–7.0)
Neutrophils: 61 %
Platelets: 297 10*3/uL (ref 150–450)
RBC: 4.27 x10E6/uL (ref 3.77–5.28)
RDW: 12.9 % (ref 11.7–15.4)
WBC: 8.5 10*3/uL (ref 3.4–10.8)

## 2020-08-18 LAB — LIPID PANEL
Chol/HDL Ratio: 5.1 ratio — ABNORMAL HIGH (ref 0.0–4.4)
Cholesterol, Total: 208 mg/dL — ABNORMAL HIGH (ref 100–199)
HDL: 41 mg/dL (ref >39)
LDL Chol Calc (NIH): 134 mg/dL — ABNORMAL HIGH (ref 0–99)
Triglycerides: 182 mg/dL — ABNORMAL HIGH (ref 0–149)
VLDL Cholesterol Cal: 33 mg/dL (ref 5–40)

## 2020-08-28 ENCOUNTER — Encounter: Payer: Self-pay | Admitting: Nurse Practitioner

## 2020-08-31 ENCOUNTER — Other Ambulatory Visit: Payer: Self-pay | Admitting: "Endocrinology

## 2020-08-31 DIAGNOSIS — K219 Gastro-esophageal reflux disease without esophagitis: Secondary | ICD-10-CM

## 2020-09-02 NOTE — Telephone Encounter (Signed)
error 

## 2020-09-11 ENCOUNTER — Encounter: Payer: Self-pay | Admitting: Nurse Practitioner

## 2020-09-29 ENCOUNTER — Ambulatory Visit (INDEPENDENT_AMBULATORY_CARE_PROVIDER_SITE_OTHER): Payer: BC Managed Care – PPO | Admitting: Pharmacist

## 2020-09-29 ENCOUNTER — Other Ambulatory Visit: Payer: Self-pay

## 2020-09-29 VITALS — Wt 223.0 lb

## 2020-09-29 DIAGNOSIS — E669 Obesity, unspecified: Secondary | ICD-10-CM

## 2020-09-29 DIAGNOSIS — R7303 Prediabetes: Secondary | ICD-10-CM

## 2020-09-29 DIAGNOSIS — E8881 Metabolic syndrome: Secondary | ICD-10-CM

## 2020-09-29 MED ORDER — OZEMPIC (0.25 OR 0.5 MG/DOSE) 2 MG/1.5ML ~~LOC~~ SOPN: PEN_INJECTOR | SUBCUTANEOUS | 3 refills | Status: DC

## 2020-09-29 NOTE — Progress Notes (Signed)
    09/29/2020 Name: Kelly Jennings MRN: [id number] DOB: Dec 07, 1980   S:  39 yoF Presents for weight loss evaluation, education, and management.  Patient was referred and last seen by Primary Care Provider on 08/17/20  She would like to lose weight and interested in Ozemipc.  She was taking Wegovy until that went on backorder (expected to return to market in august of this year). She is currently 225lbs.   Insurance coverage/medication affordability: [employer]     Patient reports adherence with medications. Current medications for weight loss: n/a Has tried phentermine (does not care for stimulants) Has tried wegovy (on backorder)   Patient is active during the day. Motivated to work out. Actively pursuing new gym membership   She reports she has difficulty with snacking/carbs   Discussed meal planning options and Plate method for healthy eating Avoid sugary drinks and desserts Incorporate balanced protein, non starchy veggies, 1 serving of carbohydrate with each meal Increase water intake Increase physical activity as able   Personal goal weight is around  180lbs     A/P:   -Healthy eating and meal planning discussed   -Increase water and exercise   -Will start Ozempic 0.25mg  sq weekly for 4 weeks, then increase to 0.5mg  sq weekly for 4 weeks (will titrate as needed).  Patient denies history of thyroid/medullary cancer.             Work to eat low fat smaller meals to reduce side effects             Decrease carbonated beverages   Written patient instructions provided.  Total time in face to face counseling 25 minutes.   Kieth Brightly, PharmD, BCPS Clinical Pharmacist, [facility]  II Phone [phone]

## 2020-10-02 ENCOUNTER — Other Ambulatory Visit: Payer: Self-pay | Admitting: *Deleted

## 2020-10-02 DIAGNOSIS — I10 Essential (primary) hypertension: Secondary | ICD-10-CM

## 2020-10-02 MED ORDER — FUROSEMIDE 20 MG PO TABS: 20.0000 mg | ORAL_TABLET | Freq: Every day | ORAL | 1 refills | Status: DC

## 2020-10-06 ENCOUNTER — Encounter: Payer: Self-pay | Admitting: Pharmacist

## 2020-11-05 ENCOUNTER — Other Ambulatory Visit: Payer: Self-pay | Admitting: *Deleted

## 2020-11-05 DIAGNOSIS — I10 Essential (primary) hypertension: Secondary | ICD-10-CM

## 2020-11-05 MED ORDER — FUROSEMIDE 20 MG PO TABS: 20.0000 mg | ORAL_TABLET | Freq: Every day | ORAL | 0 refills | Status: DC

## 2020-11-16 ENCOUNTER — Other Ambulatory Visit: Payer: Self-pay

## 2020-11-16 ENCOUNTER — Ambulatory Visit: Payer: BC Managed Care – PPO | Admitting: Nurse Practitioner

## 2020-11-16 ENCOUNTER — Encounter: Payer: Self-pay | Admitting: Nurse Practitioner

## 2020-11-16 DIAGNOSIS — F339 Major depressive disorder, recurrent, unspecified: Secondary | ICD-10-CM

## 2020-11-16 DIAGNOSIS — I10 Essential (primary) hypertension: Secondary | ICD-10-CM | POA: Diagnosis not present

## 2020-11-16 DIAGNOSIS — K2100 Gastro-esophageal reflux disease with esophagitis, without bleeding: Secondary | ICD-10-CM | POA: Diagnosis not present

## 2020-11-16 DIAGNOSIS — F419 Anxiety disorder, unspecified: Secondary | ICD-10-CM

## 2020-11-16 DIAGNOSIS — G2581 Restless legs syndrome: Secondary | ICD-10-CM | POA: Insufficient documentation

## 2020-11-16 LAB — CBC WITH DIFFERENTIAL/PLATELET
Basophils Absolute: 0 10*3/uL (ref 0.0–0.2)
Basos: 0 %
EOS (ABSOLUTE): 0.8 10*3/uL — ABNORMAL HIGH (ref 0.0–0.4)
Eos: 8 %
Hematocrit: 39.1 % (ref 34.0–46.6)
Hemoglobin: 13 g/dL (ref 11.1–15.9)
Immature Grans (Abs): 0 10*3/uL (ref 0.0–0.1)
Immature Granulocytes: 0 %
Lymphocytes Absolute: 2.1 10*3/uL (ref 0.7–3.1)
Lymphs: 21 %
MCH: 31.4 pg (ref 26.6–33.0)
MCHC: 33.2 g/dL (ref 31.5–35.7)
MCV: 94 fL (ref 79–97)
Monocytes Absolute: 0.8 10*3/uL (ref 0.1–0.9)
Monocytes: 9 %
Neutrophils Absolute: 6 10*3/uL (ref 1.4–7.0)
Neutrophils: 62 %
Platelets: 271 10*3/uL (ref 150–450)
RBC: 4.14 x10E6/uL (ref 3.77–5.28)
RDW: 12.8 % (ref 11.7–15.4)
WBC: 9.7 10*3/uL (ref 3.4–10.8)

## 2020-11-16 LAB — COMPREHENSIVE METABOLIC PANEL
ALT: 30 IU/L (ref 0–32)
AST: 20 IU/L (ref 0–40)
Albumin/Globulin Ratio: 1.9 (ref 1.2–2.2)
Albumin: 4.3 g/dL (ref 3.8–4.8)
Alkaline Phosphatase: 82 IU/L (ref 44–121)
BUN/Creatinine Ratio: 12 (ref 9–23)
BUN: 11 mg/dL (ref 6–24)
Bilirubin Total: 0.4 mg/dL (ref 0.0–1.2)
CO2: 25 mmol/L (ref 20–29)
Calcium: 9.1 mg/dL (ref 8.7–10.2)
Chloride: 98 mmol/L (ref 96–106)
Creatinine, Ser: 0.95 mg/dL (ref 0.57–1.00)
Globulin, Total: 2.3 g/dL (ref 1.5–4.5)
Glucose: 103 mg/dL — ABNORMAL HIGH (ref 65–99)
Potassium: 3.8 mmol/L (ref 3.5–5.2)
Sodium: 138 mmol/L (ref 134–144)
Total Protein: 6.6 g/dL (ref 6.0–8.5)
eGFR: 78 mL/min/{1.73_m2} (ref >59)

## 2020-11-16 LAB — LIPID PANEL
Chol/HDL Ratio: 4.2 ratio (ref 0.0–4.4)
Cholesterol, Total: 180 mg/dL (ref 100–199)
HDL: 43 mg/dL (ref >39)
LDL Chol Calc (NIH): 99 mg/dL (ref 0–99)
Triglycerides: 220 mg/dL — ABNORMAL HIGH (ref 0–149)
VLDL Cholesterol Cal: 38 mg/dL (ref 5–40)

## 2020-11-16 MED ORDER — PANTOPRAZOLE SODIUM 20 MG PO TBEC
20.0000 mg | DELAYED_RELEASE_TABLET | Freq: Every day | ORAL | 1 refills | Status: DC
Start: 2020-11-16 — End: 2021-05-17

## 2020-11-16 MED ORDER — GABAPENTIN 100 MG PO CAPS: 100.0000 mg | ORAL_CAPSULE | Freq: Three times a day (TID) | ORAL | 3 refills | Status: DC

## 2020-11-16 NOTE — Progress Notes (Signed)
Virtual Visit  Note Due to COVID-19 pandemic this visit was conducted virtually. This visit type was conducted due to national recommendations for restrictions regarding the COVID-19 Pandemic (e.g. social distancing, sheltering in place) in an effort to limit this patient's exposure and mitigate transmission in our community. All issues noted in this document were discussed and addressed.  A physical exam was not performed with this format.  I connected with Kelly Jennings on 11/16/20 at 8:13 AM by telephone and verified that I am speaking with the correct person using two identifiers. Kelly Jennings is currently located  in her car during visit. The provider, Daryll Drown, NP is located in their office at time of visit.  I discussed the limitations, risks, security and privacy concerns of performing an evaluation and management service by telephone and the availability of in person appointments. I also discussed with the patient that there may be a patient responsible charge related to this service. The patient expressed understanding and agreed to proceed.   History and Present Illness:  HPI  GERD, Follow up:  The patient was last seen for GERD 6 weeks ago. Changes made since that visit include OTC prevacid.  She reports good compliance with treatment. She is having side effects. Medication not effective.  She IS experiencing heartburn. She is NOT experiencing bilious reflux, dysphagia, or hematemesis   Pt presents for follow up of hypertension. Patient was diagnosed in 09/24/2012 The patient is tolerating the medication well without side effects. Compliance with treatment has been good; including taking medication as directed , maintains a healthy diet and regular exercise regimen , and following up as directed.  Current medication lisinopril-hydrochlorothiazide 20-25 mg tablet by mouth daily.   Restless leg syndrome: Patient is reporting new symptoms of restless leg syndrome in  the last few weeks to months.  Symptoms of tingling, pain, the need to continue to move bilateral lower extremities, waking up at nighttime and moving around.   GAD 7 : Generalized Anxiety Score 08/17/2020 05/19/2020  Nervous, Anxious, on Edge 1 2  Control/stop worrying 1 3  Worry too much - different things 1 3  Trouble relaxing 1 3  Restless 1 3  Easily annoyed or irritable 1 3  Afraid - awful might happen 0 3  Total GAD 7 Score 6 20  Anxiety Difficulty Not difficult at all Somewhat difficult     Flowsheet Row Office Visit from 08/17/2020 in [facility]  PHQ-9 Total Score 3          Review of Systems  Constitutional: Negative.   HENT: Negative.    Eyes: Negative.   Respiratory: Negative.    Cardiovascular: Negative.   Genitourinary: Negative.   Skin:  Negative for rash.  All other systems reviewed and are negative.   Observations/Objective:  Televisit patient not in distress. Assessment and Plan: GERD (gastroesophageal reflux disease) Symptoms of GERD not well controlled.  Patient continues to experience worsening heartburn even after taking Prevacid and over-the-counter Tums.  Started patient on Protonix 20 mg tablet by mouth daily education provided to patient, diet modification.  Rx sent to pharmacy follow-up with unresolved symptoms.  HTN (hypertension) Symptoms well controlled on current medication no changes necessary.  Continue low-sodium diet exercise as tolerated.  Follow-up in 3 months.  Restless leg syndrome Symptoms are new for patient in the last few weeks to months.  Education provided to patient, encourage leg massage, reduce caffeine, warm soak.  Started patient on gabapentin  100 mg tablet by mouth 3 times daily.  Follow-up with unresolved or worsening symptoms.  Rx sent to pharmacy.  Depression, recurrent (HCC) Symptoms well controlled on current medication no changes necessary.  Follow-up in 6 months.  For depression.  Completed  PHQ-9.  Anxiety Anxiety symptoms well controlled.  No changes to current medication.  Follow-up in 6 months.  Completed GAD-7.   Follow Up Instructions: Follow-up in 3 months.    I discussed the assessment and treatment plan with the patient. The patient was provided an opportunity to ask questions and all were answered. The patient agreed with the plan and demonstrated an understanding of the instructions.   The patient was advised to call back or seek an in-person evaluation if the symptoms worsen or if the condition fails to improve as anticipated.  The above assessment and management plan was discussed with the patient. The patient verbalized understanding of and has agreed to the management plan. Patient is aware to call the clinic if symptoms persist or worsen. Patient is aware when to return to the clinic for a follow-up visit. Patient educated on when it is appropriate to go to the emergency department.   Time call ended: 8:28 AM  I provided 15 minutes of  non face-to-face time during this encounter.    Daryll Drown, NP

## 2020-11-16 NOTE — Assessment & Plan Note (Addendum)
Symptoms well controlled on current medication no changes necessary.  Follow-up in 6 months.  For depression.  Completed PHQ-9.

## 2020-11-16 NOTE — Assessment & Plan Note (Signed)
Symptoms of GERD not well controlled.  Patient continues to experience worsening heartburn even after taking Prevacid and over-the-counter Tums.  Started patient on Protonix 20 mg tablet by mouth daily education provided to patient, diet modification.  Rx sent to pharmacy follow-up with unresolved symptoms.

## 2020-11-16 NOTE — Assessment & Plan Note (Signed)
Anxiety symptoms well controlled.  No changes to current medication.  Follow-up in 6 months.  Completed GAD-7.

## 2020-11-16 NOTE — Patient Instructions (Signed)
[email or web address]">  Generalized Anxiety Disorder, Adult Generalized anxiety disorder (GAD) is a mental health condition. Unlike normal worries, anxiety related to GAD is not triggered by a specific event. These worries do not fade or get better with time. GAD interferes with relationships,work, and school. GAD symptoms can vary from mild to severe. People with severe GAD can have intense waves of anxiety with physical symptoms that are similar to panicattacks. What are the causes? The exact cause of GAD is not known, but the following are believed to have an impact: Differences in natural brain chemicals. Genes passed down from parents to children. Differences in the way threats are perceived. Development during childhood. Personality. What increases the risk? The following factors may make you more likely to develop this condition: Being female. Having a family history of anxiety disorders. Being very shy. Experiencing very stressful life events, such as the death of a loved one. Having a very stressful family environment. What are the signs or symptoms? People with GAD often worry excessively about many things in their lives, such as their health and family. Symptoms may also include: Mental and emotional symptoms: Worrying excessively about natural disasters. Fear of being late. Difficulty concentrating. Fears that others are judging your performance. Physical symptoms: Fatigue. Headaches, muscle tension, muscle twitches, trembling, or feeling shaky. Feeling like your heart is pounding or beating very fast. Feeling out of breath or like you cannot take a deep breath. Having trouble falling asleep or staying asleep, or experiencing restlessness. Sweating. Nausea, diarrhea, or irritable bowel syndrome (IBS). Behavioral symptoms: Experiencing erratic moods or irritability. Avoidance of new situations. Avoidance of people. Extreme difficulty making decisions. How is this  diagnosed? This condition is diagnosed based on your symptoms and medical history. You will also have a physical exam. Your health care provider may perform tests torule out other possible causes of your symptoms. To be diagnosed with GAD, a person must have anxiety that: Is out of his or her control. Affects several different aspects of his or her life, such as work and relationships. Causes distress that makes him or her unable to take part in normal activities. Includes at least three symptoms of GAD, such as restlessness, fatigue, trouble concentrating, irritability, muscle tension, or sleep problems. Before your health care provider can confirm a diagnosis of GAD, these symptoms must be present more days than they are not, and they must last for 6 months orlonger. How is this treated? This condition may be treated with: Medicine. Antidepressant medicine is usually prescribed for long-term daily control. Anti-anxiety medicines may be added in severe cases, especially when panic attacks occur. Talk therapy (psychotherapy). Certain types of talk therapy can be helpful in treating GAD by providing support, education, and guidance. Options include: Cognitive behavioral therapy (CBT). People learn coping skills and self-calming techniques to ease their physical symptoms. They learn to identify unrealistic thoughts and behaviors and to replace them with more appropriate thoughts and behaviors. Acceptance and commitment therapy (ACT). This treatment teaches people how to be mindful as a way to cope with unwanted thoughts and feelings. Biofeedback. This process trains you to manage your body's response (physiological response) through breathing techniques and relaxation methods. You will work with a therapist while machines are used to monitor your physical symptoms. Stress management techniques. These include yoga, meditation, and exercise. A mental health specialist can help determine which treatment  is best for you. Some people see improvement with one type of therapy. However, other peoplerequire a combination of therapies. Follow   these instructions at home: Lifestyle Maintain a consistent routine and schedule. Anticipate stressful situations. Create a plan, and allow extra time to work with your plan. Practice stress management or self-calming techniques that you have learned from your therapist or your health care provider. General instructions Take over-the-counter and prescription medicines only as told by your health care provider. Understand that you are likely to have setbacks. Accept this and be kind to yourself as you persist to take better care of yourself. Recognize and accept your accomplishments, even if you judge them as small. Keep all follow-up visits as told by your health care provider. This is important. Contact a health care provider if: Your symptoms do not get better. Your symptoms get worse. You have signs of depression, such as: A persistently sad or irritable mood. Loss of enjoyment in activities that used to bring you joy. Change in weight or eating. Changes in sleeping habits. Avoiding friends or family members. Loss of energy for normal tasks. Feelings of guilt or worthlessness. Get help right away if: You have serious thoughts about hurting yourself or others. If you ever feel like you may hurt yourself or others, or have thoughts about taking your own life, get help right away. Go to your nearest emergency department or: Call your local emergency services (911 in the [place] Call a suicide crisis helpline, such as the National Suicide Prevention Lifeline at 1-800-273-8255. This is open 24 hours a day in the [place] Text the Crisis Text Line at 741741 (in the [place] Summary Generalized anxiety disorder (GAD) is a mental health condition that involves worry that is not triggered by a specific event. People with GAD often worry excessively about many things  in their lives, such as their health and family. GAD may cause symptoms such as restlessness, trouble concentrating, sleep problems, frequent sweating, nausea, diarrhea, headaches, and trembling or muscle twitching. A mental health specialist can help determine which treatment is best for you. Some people see improvement with one type of therapy. However, other people require a combination of therapies. This information is not intended to replace advice given to you by your health care provider. Make sure you discuss any questions you have with your healthcare provider. Document Revised: 01/30/2019 Document Reviewed: 01/30/2019 Elsevier Patient Education  2022 Elsevier Inc. http://APA.org/depression-guideline"> https://clinicalkey.com"> http://point-of-[email or web address]/"> http://point-of-care.elsevierperformancemanager.[email or web address]">  Managing Depression, Adult Depression is a mental health condition that affects your thoughts, feelings, and actions. Being diagnosed with depression can bring you relief if you did not know why you have felt or behaved a certain way. It could also leave you feeling overwhelmed with uncertainty about your future. Preparing yourself tomanage your symptoms can help you feel more positive about your future. How to manage lifestyle changes Managing stress  Stress is your body's reaction to life changes and events, both good and bad. Stress can add to your feelings of depression. Learning to manage your stresscan help lessen your feelings of depression. Try some of the following approaches to reducing your stress (stress reduction techniques): Listen to music that you enjoy and that inspires you. Try using a meditation app or take a meditation class. Develop a practice that helps you connect with your spiritual self. Walk in nature, pray, or go to a place of worship. Do some deep breathing. To do this, inhale slowly through your nose. Pause at the top of  your inhale for a few seconds and then exhale slowly, letting your muscles relax. Practice yoga to help relax and work your muscles. Choose   a stress reduction technique that suits your lifestyle and personality. These techniques take time and practice to develop. Set aside 5-15 minutes a day to do them. Therapists can offer training in these techniques. Other things you can do to manage stress include: Keeping a stress diary. Knowing your limits and saying no when you think something is too much. Paying attention to how you react to certain situations. You may not be able to control everything, but you can change your reaction. Adding humor to your life by watching funny films or TV shows. Making time for activities that you enjoy and that relax you.  Medicines Medicines, such as antidepressants, are often a part of treatment for depression. Talk with your pharmacist or health care provider about all the medicines, supplements, and herbal products that you take, their possible side effects, and what medicines and other products are safe to take together. Make sure to report any side effects you may have to your health care provider. Relationships Your health care provider may suggest family therapy, couples therapy, orindividual therapy as part of your treatment. How to recognize changes Everyone responds differently to treatment for depression. As you recover from depression, you may start to: Have more interest in doing activities. Feel less hopeless. Have more energy. Overeat less often, or have a better appetite. Have better mental focus. It is important to recognize if your depression is not getting better or is getting worse. The symptoms you had in the beginning may return, such as: Tiredness (fatigue) or low energy. Eating too much or too little. Sleeping too much or too little. Feeling restless, agitated, or hopeless. Trouble focusing or making decisions. Unexplained physical  complaints. Feeling irritable, angry, or aggressive. If you or your family members notice these symptoms coming back, let yourhealth care provider know right away. Follow these instructions at home: Activity  Try to get some form of exercise each day, such as walking, biking, swimming, or lifting weights. Practice stress reduction techniques. Engage your mind by taking a class or doing some volunteer work.  Lifestyle Get the right amount and quality of sleep. Cut down on using caffeine, tobacco, alcohol, and other potentially harmful substances. Eat a healthy diet that includes plenty of vegetables, fruits, whole grains, low-fat dairy products, and lean protein. Do not eat a lot of foods that are high in solid fats, added sugars, or salt (sodium). General instructions Take over-the-counter and prescription medicines only as told by your health care provider. Keep all follow-up visits as told by your health care provider. This is important. Where to find support Talking to others  Friends and family members can be sources of support and guidance. Talk to trusted friends or family members about your condition. Explain your symptoms to them, and let them know that you are working with a health care provider to treat your depression. Tell friends and family members how they also can behelpful. Finances Find appropriate mental health providers that fit with your financial situation. Talk with your health care provider about options to get reduced prices on your medicines. Where to find more information You can find support in your area from: Anxiety and Depression Association of America (ADAA): www.adaa.org Mental Health America: www.mentalhealthamerica.net National Alliance on Mental Illness: www.nami.org Contact a health care provider if: You stop taking your antidepressant medicines, and you have any of these symptoms: Nausea. Headache. Light-headedness. Chills and body aches. Not  being able to sleep (insomnia). You or your friends and family think your depression   is getting worse. Get help right away if: You have thoughts of hurting yourself or others. If you ever feel like you may hurt yourself or others, or have thoughts about taking your own life, get help right away. Go to your nearest emergency department or: Call your local emergency services (911 in the [place] Call a suicide crisis helpline, such as the National Suicide Prevention Lifeline at 817-225-4985. This is open 24 hours a day in the [place] Text the Crisis Text Line at (908) 043-8983 (in the [place] Summary If you are diagnosed with depression, preparing yourself to manage your symptoms is a good way to feel positive about your future. Work with your health care provider on a management plan that includes stress reduction techniques, medicines (if applicable), therapy, and healthy lifestyle habits. Keep talking with your health care provider about how your treatment is working. If you have thoughts about taking your own life, call a suicide crisis helpline or text a crisis text line. This information is not intended to replace advice given to you by your health care provider. Make sure you discuss any questions you have with your healthcare provider. Document Revised: 02/20/2019 Document Reviewed: 02/20/2019 Elsevier Patient Education  2022 Elsevier Inc. Gastroesophageal Reflux Disease, Adult  Gastroesophageal reflux (GER) happens when acid from the stomach flows up into the tube that connects the mouth and the stomach (esophagus). Normally, food travels down the esophagus and stays in the stomach to be digested. With GER, food and stomach acid sometimes move back up into theesophagus. You may have a disease called gastroesophageal reflux disease (GERD) if the reflux: Happens often. Causes frequent or very bad symptoms. Causes problems such as damage to the esophagus. When this happens, the esophagus becomes sore  and swollen. Over time, GERD can make small holes (ulcers) in the lining of the esophagus. What are the causes? This condition is caused by a problem with the muscle between the esophagus and the stomach. When this muscle is weak or not normal, it does not close properlyto keep food and acid from coming back up from the stomach. The muscle can be weak because of: Tobacco use. Pregnancy. Having a certain type of hernia (hiatal hernia). Alcohol use. Certain foods and drinks, such as coffee, chocolate, onions, and peppermint. What increases the risk? Being overweight. Having a disease that affects your connective tissue. Taking NSAIDs, such a ibuprofen. What are the signs or symptoms? Heartburn. Difficult or painful swallowing. The feeling of having a lump in the throat. A bitter taste in the mouth. Bad breath. Having a lot of saliva. Having an upset or bloated stomach. Burping. Chest pain. Different conditions can cause chest pain. Make sure you see your doctor if you have chest pain. Shortness of breath or wheezing. A long-term cough or a cough at night. Wearing away of the surface of teeth (tooth enamel). Weight loss. How is this treated? Making changes to your diet. Taking medicine. Having surgery. Treatment will depend on how bad your symptoms are. Follow these instructions at home: Eating and drinking  Follow a diet as told by your doctor. You may need to avoid foods and drinks such as: Coffee and tea, with or without caffeine. Drinks that contain alcohol. Energy drinks and sports drinks. Bubbly (carbonated) drinks or sodas. Chocolate and cocoa. Peppermint and mint flavorings. Garlic and onions. Horseradish. Spicy and acidic foods. These include peppers, chili powder, curry powder, vinegar, hot sauces, and BBQ sauce. Citrus fruit juices and citrus fruits, such as oranges, lemons,  and limes. Tomato-based foods. These include red sauce, chili, salsa, and pizza with red  sauce. Fried and fatty foods. These include donuts, french fries, potato chips, and high-fat dressings. High-fat meats. These include hot dogs, rib eye steak, sausage, ham, and bacon. High-fat dairy items, such as whole milk, butter, and cream cheese. Eat small meals often. Avoid eating large meals. Avoid drinking large amounts of liquid with your meals. Avoid eating meals during the 2-3 hours before bedtime. Avoid lying down right after you eat. Do not exercise right after you eat.  Lifestyle  Do not smoke or use any products that contain nicotine or tobacco. If you need help quitting, ask your doctor. Try to lower your stress. If you need help doing this, ask your doctor. If you are overweight, lose an amount of weight that is healthy for you. Ask your doctor about a safe weight loss goal.  General instructions Pay attention to any changes in your symptoms. Take over-the-counter and prescription medicines only as told by your doctor. Do not take aspirin, ibuprofen, or other NSAIDs unless your doctor says it is okay. Wear loose clothes. Do not wear anything tight around your waist. Raise (elevate) the head of your bed about 6 inches (15 cm). You may need to use a wedge to do this. Avoid bending over if this makes your symptoms worse. Keep all follow-up visits. Contact a doctor if: You have new symptoms. You lose weight and you do not know why. You have trouble swallowing or it hurts to swallow. You have wheezing or a cough that keeps happening. You have a hoarse voice. Your symptoms do not get better with treatment. Get help right away if: You have sudden pain in your arms, neck, jaw, teeth, or back. You suddenly feel sweaty, dizzy, or light-headed. You have chest pain or shortness of breath. You vomit and the vomit is green, yellow, or black, or it looks like blood or coffee grounds. You faint. Your poop (stool) is red, bloody, or black. You cannot swallow, drink, or  eat. These symptoms may represent a serious problem that is an emergency. Do not wait to see if the symptoms will go away. Get medical help right away. Call your local emergency services (911 in the [place] Do not drive yourself to the hospital. Summary If a person has gastroesophageal reflux disease (GERD), food and stomach acid move back up into the esophagus and cause symptoms or problems such as damage to the esophagus. Treatment will depend on how bad your symptoms are. Follow a diet as told by your doctor. Take all medicines only as told by your doctor. This information is not intended to replace advice given to you by your health care provider. Make sure you discuss any questions you have with your healthcare provider. Document Revised: 10/21/2019 Document Reviewed: 10/21/2019 Elsevier Patient Education  2022 Elsevier Inc. Hypertension, Adult Hypertension is another name for high blood pressure. High blood pressure forces your heart to work harder to pump blood. This can cause problems overtime. There are two numbers in a blood pressure reading. There is a top number (systolic) over a bottom number (diastolic). It is best to have a blood pressure that is below 120/80. Healthy choicescan help lower your blood pressure, or you may need medicine to help lower it. What are the causes? The cause of this condition is not known. Some conditions may be related tohigh blood pressure. What increases the risk? Smoking. Having type 2 diabetes mellitus, high cholesterol, or  both. Not getting enough exercise or physical activity. Being overweight. Having too much fat, sugar, calories, or salt (sodium) in your diet. Drinking too much alcohol. Having long-term (chronic) kidney disease. Having a family history of high blood pressure. Age. Risk increases with age. Race. You may be at higher risk if you are African American. Gender. Men are at higher risk than women before age 67. After age 57, women  are at higher risk than men. Having obstructive sleep apnea. Stress. What are the signs or symptoms? High blood pressure may not cause symptoms. Very high blood pressure (hypertensive crisis) may cause: Headache. Feelings of worry or nervousness (anxiety). Shortness of breath. Nosebleed. A feeling of being sick to your stomach (nausea). Throwing up (vomiting). Changes in how you see. Very bad chest pain. Seizures. How is this treated? This condition is treated by making healthy lifestyle changes, such as: Eating healthy foods. Exercising more. Drinking less alcohol. Your health care provider may prescribe medicine if lifestyle changes are not enough to get your blood pressure under control, and if: Your top number is above 130. Your bottom number is above 80. Your personal target blood pressure may vary. Follow these instructions at home: Eating and drinking  If told, follow the DASH eating plan. To follow this plan: Fill one half of your plate at each meal with fruits and vegetables. Fill one fourth of your plate at each meal with whole grains. Whole grains include whole-wheat pasta, brown rice, and whole-grain bread. Eat or drink low-fat dairy products, such as skim milk or low-fat yogurt. Fill one fourth of your plate at each meal with low-fat (lean) proteins. Low-fat proteins include fish, chicken without skin, eggs, beans, and tofu. Avoid fatty meat, cured and processed meat, or chicken with skin. Avoid pre-made or processed food. Eat less than 1,500 mg of salt each day. Do not drink alcohol if: Your doctor tells you not to drink. You are pregnant, may be pregnant, or are planning to become pregnant. If you drink alcohol: Limit how much you use to: 0-1 drink a day for women. 0-2 drinks a day for men. Be aware of how much alcohol is in your drink. In the [place] one drink equals one 12 oz bottle of beer (355 mL), one 5 oz glass of wine (148 mL), or one 1 oz glass of hard  liquor (44 mL).  Lifestyle  Work with your doctor to stay at a healthy weight or to lose weight. Ask your doctor what the best weight is for you. Get at least 30 minutes of exercise most days of the week. This may include walking, swimming, or biking. Get at least 30 minutes of exercise that strengthens your muscles (resistance exercise) at least 3 days a week. This may include lifting weights or doing Pilates. Do not use any products that contain nicotine or tobacco, such as cigarettes, e-cigarettes, and chewing tobacco. If you need help quitting, ask your doctor. Check your blood pressure at home as told by your doctor. Keep all follow-up visits as told by your doctor. This is important.  Medicines Take over-the-counter and prescription medicines only as told by your doctor. Follow directions carefully. Do not skip doses of blood pressure medicine. The medicine does not work as well if you skip doses. Skipping doses also puts you at risk for problems. Ask your doctor about side effects or reactions to medicines that you should watch for. Contact a doctor if you: Think you are having a reaction to the  medicine you are taking. Have headaches that keep coming back (recurring). Feel dizzy. Have swelling in your ankles. Have trouble with your vision. Get help right away if you: Get a very bad headache. Start to feel mixed up (confused). Feel weak or numb. Feel faint. Have very bad pain in your: Chest. Belly (abdomen). Throw up more than once. Have trouble breathing. Summary Hypertension is another name for high blood pressure. High blood pressure forces your heart to work harder to pump blood. For most people, a normal blood pressure is less than 120/80. Making healthy choices can help lower blood pressure. If your blood pressure does not get lower with healthy choices, you may need to take medicine. This information is not intended to replace advice given to you by your health care  provider. Make sure you discuss any questions you have with your healthcare provider. Document Revised: 12/20/2017 Document Reviewed: 12/20/2017 Elsevier Patient Education  2022 [employer].

## 2020-11-16 NOTE — Assessment & Plan Note (Signed)
Symptoms are new for patient in the last few weeks to months.  Education provided to patient, encourage leg massage, reduce caffeine, warm soak.  Started patient on gabapentin 100 mg tablet by mouth 3 times daily.  Follow-up with unresolved or worsening symptoms.  Rx sent to pharmacy.

## 2020-11-16 NOTE — Assessment & Plan Note (Signed)
Symptoms well controlled on current medication no changes necessary.  Continue low-sodium diet exercise as tolerated.  Follow-up in 3 months.

## 2020-11-17 ENCOUNTER — Other Ambulatory Visit: Payer: Self-pay | Admitting: Nurse Practitioner

## 2020-11-17 DIAGNOSIS — R7309 Other abnormal glucose: Secondary | ICD-10-CM

## 2020-11-19 LAB — HGB A1C W/O EAG: Hgb A1c MFr Bld: 5.6 % (ref 4.8–5.6)

## 2020-11-19 LAB — SPECIMEN STATUS REPORT

## 2020-11-25 ENCOUNTER — Other Ambulatory Visit: Payer: Self-pay | Admitting: "Endocrinology

## 2020-11-25 DIAGNOSIS — K219 Gastro-esophageal reflux disease without esophagitis: Secondary | ICD-10-CM

## 2020-11-26 ENCOUNTER — Other Ambulatory Visit: Payer: Self-pay | Admitting: *Deleted

## 2020-11-26 DIAGNOSIS — I10 Essential (primary) hypertension: Secondary | ICD-10-CM

## 2020-11-26 MED ORDER — FUROSEMIDE 20 MG PO TABS: 20.0000 mg | ORAL_TABLET | Freq: Every day | ORAL | 1 refills | Status: DC

## 2020-12-04 ENCOUNTER — Other Ambulatory Visit: Payer: Self-pay | Admitting: *Deleted

## 2020-12-04 MED ORDER — OZEMPIC (0.25 OR 0.5 MG/DOSE) 2 MG/1.5ML ~~LOC~~ SOPN: 0.5000 mg | PEN_INJECTOR | SUBCUTANEOUS | 0 refills | Status: DC

## 2020-12-29 ENCOUNTER — Telehealth: Payer: Self-pay | Admitting: Nurse Practitioner

## 2020-12-29 NOTE — Telephone Encounter (Signed)
  Prescription Request  12/29/2020  Is this a "Controlled Substance" medicine? NO Have you seen your PCP in the last 2 weeks? NO she was last seen in july If YES, route message to pool  -  If NO, patient needs to be scheduled for appointment.  What is the name of the medication or equipment?OZEMPIC  Have you contacted your pharmacy to request a refill? YES  Which pharmacy would you like this sent to? [facility]   Patient notified that their request is being sent to the clinical staff for review and that they should receive a response within 2 business days.

## 2020-12-30 NOTE — Telephone Encounter (Signed)
Pt is asking why she has to come back so early, it has not been 3 months and A1C is not due yet?

## 2020-12-31 ENCOUNTER — Encounter: Payer: Self-pay | Admitting: Nurse Practitioner

## 2020-12-31 ENCOUNTER — Other Ambulatory Visit: Payer: Self-pay | Admitting: Nurse Practitioner

## 2020-12-31 MED ORDER — OZEMPIC (0.25 OR 0.5 MG/DOSE) 2 MG/1.5ML ~~LOC~~ SOPN: 0.5000 mg | PEN_INJECTOR | SUBCUTANEOUS | 1 refills | Status: DC

## 2021-02-21 ENCOUNTER — Other Ambulatory Visit: Payer: Self-pay | Admitting: Nurse Practitioner

## 2021-02-21 DIAGNOSIS — F419 Anxiety disorder, unspecified: Secondary | ICD-10-CM

## 2021-02-21 DIAGNOSIS — F339 Major depressive disorder, recurrent, unspecified: Secondary | ICD-10-CM

## 2021-03-03 ENCOUNTER — Telehealth: Payer: Self-pay | Admitting: Pharmacist

## 2021-03-03 NOTE — Telephone Encounter (Signed)
I had a voicemail from patient and I couldn't understand what she needed.  It sounded to me like she needs a bigger pen size? I have not seen patient since June.  Can you confirm her ozempic dose? There is a 0.25, 0.5, 1, 2mg  pen 2mg  has been on back order  Thank you! 

## 2021-03-04 NOTE — Telephone Encounter (Signed)
Would not go through

## 2021-03-12 ENCOUNTER — Other Ambulatory Visit: Payer: Self-pay | Admitting: Nurse Practitioner

## 2021-03-12 ENCOUNTER — Telehealth: Payer: Self-pay | Admitting: Nurse Practitioner

## 2021-03-12 DIAGNOSIS — E6609 Other obesity due to excess calories: Secondary | ICD-10-CM

## 2021-03-12 DIAGNOSIS — Z6839 Body mass index (BMI) 39.0-39.9, adult: Secondary | ICD-10-CM

## 2021-03-12 MED ORDER — SAXENDA 18 MG/3ML ~~LOC~~ SOPN: 0.6000 mg | PEN_INJECTOR | Freq: Every day | SUBCUTANEOUS | 2 refills | Status: DC

## 2021-03-12 NOTE — Telephone Encounter (Signed)
Pt aware.

## 2021-03-12 NOTE — Telephone Encounter (Signed)
I saw patient in June when we sent in Ozempic.  I don't know what she is referring to.  Reginal Lutes is backordered She already tried saxenda --we can do that again if she wants.  There is nothing left to my knowledge.  She does not qualify for [facility] UNLESS she has a diagnosis of T2DM  Also, if she keeps switching and doesn't stay on something and have proven weight loss--insurance is going to start denying Must document 4% body weight loss at 4months

## 2021-03-12 NOTE — Telephone Encounter (Signed)
Pt calling to check on medicine that she was trying to get approved by the insurance. York Spaniel that she talked to [place] about it. She has not had any missed calls. Please call back and advise.

## 2021-03-12 NOTE — Telephone Encounter (Signed)
Patient aware and states she will try the saxenda again.

## 2021-03-12 NOTE — Telephone Encounter (Signed)
Refer to previous phone call. What is next step?

## 2021-03-12 NOTE — Telephone Encounter (Signed)
Lmtcb.

## 2021-03-17 ENCOUNTER — Telehealth: Payer: Self-pay | Admitting: *Deleted

## 2021-03-17 DIAGNOSIS — E6609 Other obesity due to excess calories: Secondary | ICD-10-CM

## 2021-03-17 DIAGNOSIS — Z6839 Body mass index (BMI) 39.0-39.9, adult: Secondary | ICD-10-CM

## 2021-03-17 NOTE — Telephone Encounter (Signed)
Rx #: [id number] Saxenda 18MG pen-injectors  (Key: [id number]) Sent to plan

## 2021-03-21 ENCOUNTER — Other Ambulatory Visit: Payer: Self-pay | Admitting: Nurse Practitioner

## 2021-03-21 DIAGNOSIS — G2581 Restless legs syndrome: Secondary | ICD-10-CM

## 2021-03-23 NOTE — Telephone Encounter (Signed)
We're pleased to let you know that we've approved your or your doctor's request for coverage (sometimes called a prior authorization) for Saxenda (liraglutide). You can now fill your prescription, and it will be covered according to your plan. As long as you remain covered by your prescription drug plan and there are no changes to your plan benefits, this request is approved from 03/19/2021 to 07/17/2021. When this approval expires, please speak to your doctor about your treatment.  Pharmacy and patient aware.

## 2021-04-19 ENCOUNTER — Other Ambulatory Visit: Payer: Self-pay | Admitting: Nurse Practitioner

## 2021-04-19 DIAGNOSIS — G2581 Restless legs syndrome: Secondary | ICD-10-CM

## 2021-05-15 ENCOUNTER — Other Ambulatory Visit: Payer: Self-pay | Admitting: Nurse Practitioner

## 2021-05-15 DIAGNOSIS — I10 Essential (primary) hypertension: Secondary | ICD-10-CM

## 2021-05-15 DIAGNOSIS — K2100 Gastro-esophageal reflux disease with esophagitis, without bleeding: Secondary | ICD-10-CM

## 2021-05-21 ENCOUNTER — Other Ambulatory Visit: Payer: Self-pay | Admitting: Nurse Practitioner

## 2021-05-21 DIAGNOSIS — F419 Anxiety disorder, unspecified: Secondary | ICD-10-CM

## 2021-05-21 DIAGNOSIS — F339 Major depressive disorder, recurrent, unspecified: Secondary | ICD-10-CM

## 2021-05-25 ENCOUNTER — Emergency Department (HOSPITAL_COMMUNITY)
Admission: EM | Admit: 2021-05-25 | Discharge: 2021-05-25 | Disposition: A | Discharge status: Home / Self Care | Payer: BC Managed Care – PPO | Attending: Emergency Medicine | Admitting: Emergency Medicine

## 2021-05-25 ENCOUNTER — Emergency Department (HOSPITAL_COMMUNITY): Payer: BC Managed Care – PPO

## 2021-05-25 ENCOUNTER — Encounter (HOSPITAL_COMMUNITY): Payer: Self-pay | Admitting: Emergency Medicine

## 2021-05-25 ENCOUNTER — Other Ambulatory Visit: Payer: Self-pay

## 2021-05-25 DIAGNOSIS — Z79899 Other long term (current) drug therapy: Secondary | ICD-10-CM | POA: Insufficient documentation

## 2021-05-25 DIAGNOSIS — R110 Nausea: Secondary | ICD-10-CM | POA: Diagnosis not present

## 2021-05-25 DIAGNOSIS — I10 Essential (primary) hypertension: Secondary | ICD-10-CM | POA: Insufficient documentation

## 2021-05-25 DIAGNOSIS — R55 Syncope and collapse: Secondary | ICD-10-CM | POA: Diagnosis present

## 2021-05-25 DIAGNOSIS — R197 Diarrhea, unspecified: Secondary | ICD-10-CM | POA: Diagnosis not present

## 2021-05-25 LAB — CBC WITH DIFFERENTIAL/PLATELET
Abs Immature Granulocytes: 0.04 10*3/uL (ref 0.00–0.07)
Basophils Absolute: 0 10*3/uL (ref 0.0–0.1)
Basophils Relative: 0 %
Eosinophils Absolute: 0.4 10*3/uL (ref 0.0–0.5)
Eosinophils Relative: 4 %
HCT: 40.2 % (ref 36.0–46.0)
Hemoglobin: 13.1 g/dL (ref 12.0–15.0)
Immature Granulocytes: 0 %
Lymphocytes Relative: 17 %
Lymphs Abs: 1.6 10*3/uL (ref 0.7–4.0)
MCH: 30.6 pg (ref 26.0–34.0)
MCHC: 32.6 g/dL (ref 30.0–36.0)
MCV: 93.9 fL (ref 80.0–100.0)
Monocytes Absolute: 0.7 10*3/uL (ref 0.1–1.0)
Monocytes Relative: 8 %
Neutro Abs: 6.8 10*3/uL (ref 1.7–7.7)
Neutrophils Relative %: 71 %
Platelets: 309 10*3/uL (ref 150–400)
RBC: 4.28 MIL/uL (ref 3.87–5.11)
RDW: 12.6 % (ref 11.5–15.5)
WBC: 9.6 10*3/uL (ref 4.0–10.5)
nRBC: 0 % (ref 0.0–0.2)

## 2021-05-25 LAB — COMPREHENSIVE METABOLIC PANEL
ALT: 34 U/L (ref 0–44)
AST: 25 U/L (ref 15–41)
Albumin: 4.1 g/dL (ref 3.5–5.0)
Alkaline Phosphatase: 72 U/L (ref 38–126)
Anion gap: 10 (ref 5–15)
BUN: 11 mg/dL (ref 6–20)
CO2: 22 mmol/L (ref 22–32)
Calcium: 9 mg/dL (ref 8.9–10.3)
Chloride: 103 mmol/L (ref 98–111)
Creatinine, Ser: 0.92 mg/dL (ref 0.44–1.00)
GFR, Estimated: >60 mL/min (ref >60)
Glucose, Bld: 123 mg/dL — ABNORMAL HIGH (ref 70–99)
Potassium: 3.7 mmol/L (ref 3.5–5.1)
Sodium: 135 mmol/L (ref 135–145)
Total Bilirubin: 0.5 mg/dL (ref 0.3–1.2)
Total Protein: 7.3 g/dL (ref 6.5–8.1)

## 2021-05-25 NOTE — ED Provider Triage Note (Signed)
Emergency Medicine Provider Triage Evaluation Note  Kelly Jennings , a 41 y.o. female  was evaluated in triage.  Pt complains of a syncopal episode.  Patient states that she went to bed tonight feeling more tired than normal.  She woke up to go to the restroom and sat on the toilet.  She states that she does not remember losing consciousness but woke up on the floor and struck the bridge of her nose.  Reports pain in the region.  Denies any other regions of pain.  States that she has a history of syncopal episodes that typically occur in the same fashion.  Denies any chest pain or shortness of breath before, during, or since the incident.  Patient does note that since losing consciousness she has had nausea as well as diarrhea.  Physical Exam  BP 133/88 (BP Location: Right Arm)    Pulse 86    Temp (!) 97.5 F (36.4 C) (Oral)    Resp 17    SpO2 97%  Gen:   Awake, no distress   Resp:  Normal effort  MSK:   Moves extremities without difficulty  Other:  Moving all 4 extremities with ease.  Ambulatory with a steady gait.  Medical Decision Making  Medically screening exam initiated at 3:45 AM.  Appropriate orders placed.  ANAMARIA DUSENBURY was informed that the remainder of the evaluation will be completed by another provider, this initial triage assessment does not replace that evaluation, and the importance of remaining in the ED until their evaluation is complete.   Placido Sou, PA-C 05/25/21 [phone]

## 2021-05-25 NOTE — ED Notes (Signed)
Patient verbalizes understanding of discharge instructions. Opportunity for questioning and answers were provided. Armband removed by staff, pt discharged from ED and ambulated to lobby to return home.   

## 2021-05-25 NOTE — ED Triage Notes (Signed)
Pt reports going to be feeling "bad" (tired).  She woke up "in a cold sweat and had to go to the bathroom."  She states she sat down and then woke up on the floor.  She believes she hit her head on the paper towel holder. She reports n/d/ since episode.   Hx of same

## 2021-05-25 NOTE — Discharge Instructions (Addendum)
Please return to the ED with any new or worsening signs or symptoms such as vomiting, confusion, lethargy, headaches, loss of consciousness Please follow-up with your cardiologist in the next 2 to 3 days.  Please call to make an appointment for ongoing evaluation and management Please continue to take your home medications as prescribed Please consider following up with your PCP in the next 5 to 7 days for ongoing evaluation management of your syncopal episodes

## 2021-05-25 NOTE — ED Provider Notes (Addendum)
[facility] EMERGENCY DEPARTMENT Provider Note   CSN: [id number] Arrival date & time: 05/25/21  [id number]     History  Chief Complaint  Patient presents with   Loss of Consciousness    Kelly Jennings is a 41 y.o. female with history of GERD, hypertension, migraines.  Patient states that last night she went to bed feeling "bad".  Patient reports sometime in the middle the night she woke up needing to use the restroom.  Patient states that she went to the restroom and came back to her bed when she felt the need to get a cold rag because she felt warm.  Patient reports getting out of bed to retrieve the rag and then states the next thing that she remembers she was on the floor.  Patient states that she was on the floor for no more than 2 minutes before waking up.  Patient states that she has a history of syncopal events for the last 10 years for which she is seen a cardiologist as well as her PCP.  Patient's cardiologist adjusted her blood pressure medication as she believed that the patient's blood pressure was "bottoming out".  Patient states that she thinks that she hit the bridge of her nose on the toilet paper holder.  Patient endorses nausea, syncope, diarrhea.  Patient denies vomiting, headache, dizziness, fevers, chest pains, shortness of breath.   Loss of Consciousness Associated symptoms: nausea   Associated symptoms: no chest pain, no dizziness, no fever, no headaches, no shortness of breath, no vomiting and no weakness       Home Medications Prior to Admission medications   Medication Sig Start Date End Date Taking? Authorizing Provider  Cholecalciferol ([facility] D3) 125 MCG (5000 UT) capsule TAKE 1 CAPSULE (5,000 UNITS TOTAL) BY MOUTH DAILY. 01/23/20   Ivy Lynn, NP  escitalopram (LEXAPRO) 5 MG tablet Take 1 tablet (5 mg total) by mouth daily. (NEEDS TO BE SEEN BEFORE NEXT REFILL) 05/21/21   Ivy Lynn, NP  furosemide (LASIX) 20 MG tablet Take 1-2 tablets  (20-40 mg total) by mouth daily. 11/26/20   Ivy Lynn, NP  gabapentin (NEURONTIN) 100 MG capsule Take 1 capsule (100 mg total) by mouth 3 (three) times daily. (NEEDS TO BE SEEN BEFORE NEXT REFILL) 03/22/21   Ivy Lynn, NP  Liraglutide -Weight Management (SAXENDA) 18 MG/3ML SOPN Inject 0.6 mg into the skin daily. Initiate at 0.6 mg subcu every day for 1 week, increase by 0.6 mg/day and weekly intervals until a dose of 3 mg/day achieved 03/12/21   Ivy Lynn, NP  lisinopril-hydrochlorothiazide (ZESTORETIC) 20-25 MG tablet Take 1 tablet by mouth daily. (NEEDS TO BE SEEN BEFORE NEXT REFILL) 05/17/21   Ronnie Doss M, DO  pantoprazole (PROTONIX) 20 MG tablet Take 1 tablet (20 mg total) by mouth daily. (NEEDS TO BE SEEN BEFORE NEXT REFILL) 05/17/21   Janora Norlander, DO      Allergies    Penicillins    Review of Systems   Review of Systems  Constitutional:  Negative for fever.  Respiratory:  Negative for shortness of breath.   Cardiovascular:  Positive for syncope. Negative for chest pain.  Gastrointestinal:  Positive for diarrhea and nausea. Negative for abdominal pain and vomiting.  Musculoskeletal:  Negative for gait problem.  Neurological:  Positive for syncope. Negative for dizziness, weakness, light-headedness and headaches.  All other systems reviewed and are negative.  Physical Exam Updated Vital Signs BP (!) 131/95  Pulse (!) 59    Temp (!) 97.5 F (36.4 C) (Oral)    Resp 14    Ht 5\' 2"  (1.575 m)    Wt 99.8 kg    SpO2 97%    BMI 40.24 kg/m  Physical Exam Vitals and nursing note reviewed.  Constitutional:      General: She is not in acute distress.    Appearance: She is not ill-appearing or toxic-appearing.  HENT:     Head: Normocephalic.     Nose:     Comments: 1 mm laceration to the bridge of the patient's nose.  Bleeding controlled.  No indication for suture repair needed.    Mouth/Throat:     Mouth: Mucous membranes are moist.  Eyes:      Extraocular Movements: Extraocular movements intact.     Pupils: Pupils are equal, round, and reactive to light.  Cardiovascular:     Rate and Rhythm: Normal rate and regular rhythm.  Pulmonary:     Effort: Pulmonary effort is normal.     Breath sounds: Normal breath sounds. No stridor. No wheezing, rhonchi or rales.  Abdominal:     General: Abdomen is flat.     Palpations: Abdomen is soft.     Tenderness: There is no abdominal tenderness. There is no guarding.  Musculoskeletal:        General: Normal range of motion.     Cervical back: Normal range of motion and neck supple. No rigidity or tenderness.  Skin:    General: Skin is warm and dry.     Capillary Refill: Capillary refill takes less than 2 seconds.  Neurological:     General: No focal deficit present.     Mental Status: She is alert and oriented to person, place, and time.     GCS: GCS eye subscore is 4. GCS verbal subscore is 5. GCS motor subscore is 6.     Cranial Nerves: Cranial nerves 2-12 are intact. No cranial nerve deficit.     Sensory: Sensation is intact. No sensory deficit.     Motor: Motor function is intact. No weakness.     Coordination: Coordination is intact. Coordination normal. Finger-Nose-Finger Test and Heel to [facility] Test normal.  Psychiatric:        Mood and Affect: Mood normal.    ED Results / Procedures / Treatments   Labs (all labs ordered are listed, but only abnormal results are displayed) Labs Reviewed  COMPREHENSIVE METABOLIC PANEL - Abnormal; Notable for the following components:      Result Value   Glucose, Bld 123 (*)    All other components within normal limits  CBC WITH DIFFERENTIAL/PLATELET  I-STAT BETA HCG BLOOD, ED ([facility], [facility], [facility] ONLY)    EKG None  Radiology CT HEAD WO CONTRAST (5MM)  Result Date: 05/25/2021 CLINICAL DATA:  41 year old female status post syncope, fall in bathroom. EXAM: CT HEAD WITHOUT CONTRAST TECHNIQUE: Contiguous axial images were obtained from the base of the  skull through the vertex without intravenous contrast. RADIATION DOSE REDUCTION: This exam was performed according to the departmental dose-optimization program which includes automated exposure control, adjustment of the mA and/or kV according to patient size and/or use of iterative reconstruction technique. COMPARISON:  Report of [facility] head CT 02/05/2010 (no images available). FINDINGS: Brain: Normal cerebral volume. No midline shift, ventriculomegaly, mass effect, evidence of mass lesion, intracranial hemorrhage or evidence of cortically based acute infarction. Gray-white matter differentiation is within normal limits throughout the brain. Vascular:  No suspicious intracranial vascular hyperdensity. Skull: No fracture identified. Sinuses/Orbits: Paranasal sinuses are well aerated, trace mucosal thickening in the left maxillary sinus. Tympanic cavities and mastoids are clear. Other: No orbit or scalp soft tissue injury identified. IMPRESSION: No acute traumatic injury identified. Normal noncontrast CT appearance of the brain. Electronically Signed   By: Genevie Ann M.D.   On: 05/25/2021 04:13    Procedures Procedures    Medications Ordered in ED Medications - No data to display  ED Course/ Medical Decision Making/ A&P                           Medical Decision Making  41 year old female with history of hypertension, GERD.  Patient presents due to syncopal event last night around 1 AM.  Patient has a history of the same, been seen by cardiology in the past where they believe this was due to patient's blood pressure.  On examination, patient is alert and oriented, nonfebrile, nontachycardic, nontoxic in appearance.  Patient has no focal neurodeficits noted on examination. No weakness, ataxia, coordination issues.   Patient imaging and labs ordered and interpreted by me include: CMP, CBC, CT head without contrast, EKG. CMP: Shows slightly elevated glucose at 123 CBC: Unremarkable CT  Head: No intracranial abnormality noted, no signs of head bleed, no sign of injury EKG: Shows sinus rhythm, no signs of arrhythmia, no signs of HOCM, no QT prolongation  At this time, there seems to be no emergent cause of patient's syncopal event.  Patient states this has been a known problem for herself for the last 10 years, she has been worked up for this issue by her cardiologist.  At this time, this patient is stable for discharge.  Her work-up while here is been largely reassuring.  I have advised the patient that she should follow-up with her cardiologist for ongoing evaluation and management as well as her PCP.  I provided her with return precautions and she voiced understanding.  I have answered all the questions of the patient to her satisfaction.  I discussed the patient and her case with Dr. Billy Fischer who is in agreement with the plan.  The patient is stable at the time of discharge.   Final Clinical Impression(s) / ED Diagnoses Final diagnoses:  Syncope and collapse    Rx / DC Orders ED Discharge Orders     None             Lawana Chambers 05/25/21 1258    Gareth Morgan, MD 05/27/21 2206

## 2021-06-07 ENCOUNTER — Other Ambulatory Visit: Payer: Self-pay

## 2021-06-07 ENCOUNTER — Encounter: Payer: Self-pay | Admitting: Internal Medicine

## 2021-06-07 ENCOUNTER — Ambulatory Visit: Payer: BC Managed Care – PPO | Admitting: Internal Medicine

## 2021-06-07 VITALS — BP 119/81 | HR 70 | Ht 62.0 in | Wt 229.4 lb

## 2021-06-07 DIAGNOSIS — R55 Syncope and collapse: Secondary | ICD-10-CM | POA: Diagnosis not present

## 2021-06-07 NOTE — Patient Instructions (Signed)

## 2021-06-07 NOTE — Progress Notes (Signed)
Cardiology Office Note:    Date:  06/07/2021   ID:  Kelly Jennings, DOB August 02, 1980, MRN [id number]  PCP:  Daryll Drown, NP   [facility] Providers Cardiologist:  Maisie Fus, MD     Referring MD: Daryll Drown, NP   No chief complaint on file. Syncope  History of Present Illness:    Kelly Jennings is a 41 y.o. female with a hx of HTN, GERD, migraines referral from the ED for a syncopal episode  Per HPI in the ED 05/25/2021  Patient states that last night she went to bed feeling "bad".  Patient reports sometime in the middle the night she woke up needing to use the restroom.  Patient states that she went to the restroom and came back to her bed when she felt the need to get a cold rag because she felt warm.  Patient reports getting out of bed to retrieve the rag and then states the next thing that she remembers she was on the floor.  Patient states that she was on the floor for no more than 2 minutes before waking up.  Patient states that she has a history of syncopal events for the last 10 years for which she is seen a cardiologist as well as her PCP.  Patient's cardiologist adjusted her blood pressure medication as she believed that the patient's blood pressure was "bottoming out".  Patient states that she thinks that she hit the bridge of her nose on the toilet paper holder.  Patient endorses nausea, syncope, diarrhea.  Patient denies vomiting, headache, dizziness, fevers, chest pains, shortness of breath.  Saw Dr. Ladona Ridgel for possible syncope in the setting of autonomic dysfunction. Her echo was normal. Prior events not concerning for significant brady or ventricular arrhythmia. She does not have a diagnosis of POTS. She's had these episodes for several years. She had a normal echo 02/04/2019. She was told she had vasovagal syncope. She wore cardiac monitor years ago for 5 days per patient and it was normal. Normal TSH  Past Medical History:  Diagnosis Date   GERD  (gastroesophageal reflux disease)    Hypertension    Migraines     Past Surgical History:  Procedure Laterality Date   CHOLECYSTECTOMY      Current Medications: Current Meds  Medication Sig   escitalopram (LEXAPRO) 5 MG tablet Take 1 tablet (5 mg total) by mouth daily. (NEEDS TO BE SEEN BEFORE NEXT REFILL)   furosemide (LASIX) 20 MG tablet Take 1-2 tablets (20-40 mg total) by mouth daily.   gabapentin (NEURONTIN) 100 MG capsule Take 1 capsule (100 mg total) by mouth 3 (three) times daily. (NEEDS TO BE SEEN BEFORE NEXT REFILL)   Liraglutide -Weight Management (SAXENDA) 18 MG/3ML SOPN Inject 0.6 mg into the skin daily. Initiate at 0.6 mg subcu every day for 1 week, increase by 0.6 mg/day and weekly intervals until a dose of 3 mg/day achieved   lisinopril-hydrochlorothiazide (ZESTORETIC) 20-25 MG tablet Take 1 tablet by mouth daily. (NEEDS TO BE SEEN BEFORE NEXT REFILL)   pantoprazole (PROTONIX) 20 MG tablet Take 1 tablet (20 mg total) by mouth daily. (NEEDS TO BE SEEN BEFORE NEXT REFILL)     Allergies:   Penicillins   Social History   Socioeconomic History   Marital status: Married    Spouse name: Not on file   Number of children: Not on file   Years of education: Not on file   Highest education level: Not on file  Occupational History   Not on file  Tobacco Use   Smoking status: Never   Smokeless tobacco: Never  Vaping Use   Vaping Use: Never used  Substance and Sexual Activity   Alcohol use: No   Drug use: No   Sexual activity: Not on file  Other Topics Concern   Not on file  Social History Narrative   Not on file   Social Determinants of Health   Financial Resource Strain: Not on file  Food Insecurity: Not on file  Transportation Needs: Not on file  Physical Activity: Not on file  Stress: Not on file  Social Connections: Not on file     Family History: The patient's family history includes Diabetes in her father; Hyperlipidemia in her father and mother;  Hypertension in her father and mother.  ROS:   Please see the history of present illness.     All other systems reviewed and are negative.  EKGs/Labs/Other Studies Reviewed:    The following studies were reviewed today:   EKG:  EKG is  ordered today.  The ekg ordered today demonstrates   NSR, normal intervals  Recent Labs: 05/25/2021: ALT 34; BUN 11; Creatinine, Ser 0.92; Hemoglobin 13.1; Platelets 309; Potassium 3.7; Sodium 135  Recent Lipid Panel    Component Value Date/Time   CHOL 180 11/16/2020 0853   TRIG 220 (H) 11/16/2020 0853   HDL 43 11/16/2020 0853   CHOLHDL 4.2 11/16/2020 0853   LDLCALC 99 11/16/2020 0853     Risk Assessment/Calculations:           Physical Exam:    VS:    Vitals:   06/07/21 1604  BP: 119/81  Pulse: 70  SpO2: 98%      Wt Readings from Last 3 Encounters:  06/07/21 229 lb 6.4 oz (104.1 kg)  05/25/21 220 lb (99.8 kg)  10/06/20 223 lb (101.2 kg)     GEN:  Well nourished, well developed in no acute distress HEENT: Normal NECK: No JVD; No carotid bruits LYMPHATICS: No lymphadenopathy CARDIAC: RRR, no murmurs, rubs, gallops RESPIRATORY:  Clear to auscultation without rales, wheezing or rhonchi  ABDOMEN: Soft, non-tender, non-distended MUSCULOSKELETAL:  No edema; No deformity  SKIN: Warm and dry NEUROLOGIC:  Alert and oriented x 3 PSYCHIATRIC:  Normal affect   ASSESSMENT:    Vasovagal Syncope: She has hx of vasovagal syncope. This episode is similar to prior. We discussed that her trigger may be psychosomatic and/or migraines. We discussed trying prn zofran/benadryl and hydrating well. Further, it's important she stay in bed if she develops acute nausea pet above until her symptoms pass. She's had prior normal cardiac w/u.   PLAN:    In order of problems listed above:  Follow up PRN            Medication Adjustments/Labs and Tests Ordered: Current medicines are reviewed at length with the patient today.  Concerns  regarding medicines are outlined above.  No orders of the defined types were placed in this encounter.  No orders of the defined types were placed in this encounter.   Patient Instructions  Medication Instructions:  No Changes In Medications at this time.  *If you need a refill on your cardiac medications before your next appointment, please call your pharmacy*  Follow-Up: At [facility], you and your health needs are our priority.  As part of our continuing mission to provide you with exceptional heart care, we have created designated Provider Care Teams.  These Care Teams  include your primary Cardiologist (physician) and Advanced Practice Providers (APPs -  Physician Assistants and Nurse Practitioners) who all work together to provide you with the care you need, when you need it.  Your next appointment:   AS NEEDED   The format for your next appointment:   In Person  Provider:   Maisie Fus, MD     Signed, Maisie Fus, MD  06/07/2021 5:27 PM    [facility]

## 2021-06-12 ENCOUNTER — Other Ambulatory Visit: Payer: Self-pay | Admitting: Family Medicine

## 2021-06-12 DIAGNOSIS — K2100 Gastro-esophageal reflux disease with esophagitis, without bleeding: Secondary | ICD-10-CM

## 2021-06-12 DIAGNOSIS — I10 Essential (primary) hypertension: Secondary | ICD-10-CM

## 2021-06-15 ENCOUNTER — Other Ambulatory Visit: Payer: Self-pay | Admitting: Nurse Practitioner

## 2021-06-15 DIAGNOSIS — F339 Major depressive disorder, recurrent, unspecified: Secondary | ICD-10-CM

## 2021-06-15 DIAGNOSIS — F419 Anxiety disorder, unspecified: Secondary | ICD-10-CM

## 2021-06-16 ENCOUNTER — Other Ambulatory Visit: Payer: Self-pay | Admitting: Nurse Practitioner

## 2021-06-16 DIAGNOSIS — E6609 Other obesity due to excess calories: Secondary | ICD-10-CM

## 2021-06-16 DIAGNOSIS — Z6839 Body mass index (BMI) 39.0-39.9, adult: Secondary | ICD-10-CM

## 2021-08-09 ENCOUNTER — Other Ambulatory Visit: Payer: Self-pay | Admitting: Nurse Practitioner

## 2021-08-09 DIAGNOSIS — I10 Essential (primary) hypertension: Secondary | ICD-10-CM

## 2021-11-11 ENCOUNTER — Other Ambulatory Visit: Payer: Self-pay | Admitting: Nurse Practitioner

## 2021-11-11 DIAGNOSIS — I10 Essential (primary) hypertension: Secondary | ICD-10-CM

## 2021-11-30 ENCOUNTER — Other Ambulatory Visit: Payer: Self-pay | Admitting: Nurse Practitioner

## 2021-11-30 ENCOUNTER — Ambulatory Visit (INDEPENDENT_AMBULATORY_CARE_PROVIDER_SITE_OTHER): Payer: BC Managed Care – PPO

## 2021-11-30 ENCOUNTER — Ambulatory Visit: Payer: BC Managed Care – PPO | Admitting: Nurse Practitioner

## 2021-11-30 ENCOUNTER — Encounter: Payer: Self-pay | Admitting: Nurse Practitioner

## 2021-11-30 VITALS — BP 129/83 | HR 69 | Temp 98.5°F | Ht 62.0 in | Wt 230.0 lb

## 2021-11-30 DIAGNOSIS — R1012 Left upper quadrant pain: Secondary | ICD-10-CM | POA: Diagnosis not present

## 2021-11-30 DIAGNOSIS — F339 Major depressive disorder, recurrent, unspecified: Secondary | ICD-10-CM

## 2021-11-30 DIAGNOSIS — E8881 Metabolic syndrome: Secondary | ICD-10-CM | POA: Diagnosis not present

## 2021-11-30 DIAGNOSIS — I10 Essential (primary) hypertension: Secondary | ICD-10-CM

## 2021-11-30 DIAGNOSIS — F419 Anxiety disorder, unspecified: Secondary | ICD-10-CM

## 2021-11-30 DIAGNOSIS — Z6839 Body mass index (BMI) 39.0-39.9, adult: Secondary | ICD-10-CM

## 2021-11-30 DIAGNOSIS — E6609 Other obesity due to excess calories: Secondary | ICD-10-CM

## 2021-11-30 DIAGNOSIS — E669 Obesity, unspecified: Secondary | ICD-10-CM

## 2021-11-30 DIAGNOSIS — R7309 Other abnormal glucose: Secondary | ICD-10-CM

## 2021-11-30 DIAGNOSIS — G2581 Restless legs syndrome: Secondary | ICD-10-CM

## 2021-11-30 DIAGNOSIS — K2100 Gastro-esophageal reflux disease with esophagitis, without bleeding: Secondary | ICD-10-CM

## 2021-11-30 LAB — BAYER DCA HB A1C WAIVED: HB A1C (BAYER DCA - WAIVED): 5.6 % (ref 4.8–5.6)

## 2021-11-30 MED ORDER — FUROSEMIDE 20 MG PO TABS: 20.0000 mg | ORAL_TABLET | Freq: Every day | ORAL | 0 refills | Status: DC

## 2021-11-30 MED ORDER — GABAPENTIN 100 MG PO CAPS: 100.0000 mg | ORAL_CAPSULE | Freq: Three times a day (TID) | ORAL | 0 refills | Status: DC

## 2021-11-30 MED ORDER — PANTOPRAZOLE SODIUM 20 MG PO TBEC: 20.0000 mg | DELAYED_RELEASE_TABLET | Freq: Every day | ORAL | 0 refills | Status: DC

## 2021-11-30 MED ORDER — LISINOPRIL-HYDROCHLOROTHIAZIDE 20-25 MG PO TABS: 1.0000 | ORAL_TABLET | Freq: Every day | ORAL | 0 refills | Status: DC

## 2021-11-30 MED ORDER — SAXENDA 18 MG/3ML ~~LOC~~ SOPN: 0.6000 mg | PEN_INJECTOR | Freq: Every day | SUBCUTANEOUS | 2 refills | Status: DC

## 2021-11-30 MED ORDER — ESCITALOPRAM OXALATE 5 MG PO TABS: 5.0000 mg | ORAL_TABLET | Freq: Every day | ORAL | 0 refills | Status: DC

## 2021-11-30 NOTE — Assessment & Plan Note (Signed)
Symptoms well controlled , no changes necessary

## 2021-11-30 NOTE — Assessment & Plan Note (Signed)
Blood pressure well controlled, no changes needed.

## 2021-11-30 NOTE — Patient Instructions (Signed)
Hypertension, Adult ?Hypertension is another name for high blood pressure. High blood pressure forces your heart to work harder to pump blood. This can cause problems over time. ?There are two numbers in a blood pressure reading. There is a top number (systolic) over a bottom number (diastolic). It is best to have a blood pressure that is below 120/80. ?What are the causes? ?The cause of this condition is not known. Some other conditions can lead to high blood pressure. ?What increases the risk? ?Some lifestyle factors can make you more likely to develop high blood pressure: ?Smoking. ?Not getting enough exercise or physical activity. ?Being overweight. ?Having too much fat, sugar, calories, or salt (sodium) in your diet. ?Drinking too much alcohol. ?Other risk factors include: ?Having any of these conditions: ?Heart disease. ?Diabetes. ?High cholesterol. ?Kidney disease. ?Obstructive sleep apnea. ?Having a family history of high blood pressure and high cholesterol. ?Age. The risk increases with age. ?Stress. ?What are the signs or symptoms? ?High blood pressure may not cause symptoms. Very high blood pressure (hypertensive crisis) may cause: ?Headache. ?Fast or uneven heartbeats (palpitations). ?Shortness of breath. ?Nosebleed. ?Vomiting or feeling like you may vomit (nauseous). ?Changes in how you see. ?Very bad chest pain. ?Feeling dizzy. ?Seizures. ?How is this treated? ?This condition is treated by making healthy lifestyle changes, such as: ?Eating healthy foods. ?Exercising more. ?Drinking less alcohol. ?Your doctor may prescribe medicine if lifestyle changes do not help enough and if: ?Your top number is above 130. ?Your bottom number is above 80. ?Your personal target blood pressure may vary. ?Follow these instructions at home: ?Eating and drinking ? ?If told, follow the DASH eating plan. To follow this plan: ?Fill one half of your plate at each meal with fruits and vegetables. ?Fill one fourth of your plate  at each meal with whole grains. Whole grains include whole-wheat pasta, brown rice, and whole-grain bread. ?Eat or drink low-fat dairy products, such as skim milk or low-fat yogurt. ?Fill one fourth of your plate at each meal with low-fat (lean) proteins. Low-fat proteins include fish, chicken without skin, eggs, beans, and tofu. ?Avoid fatty meat, cured and processed meat, or chicken with skin. ?Avoid pre-made or processed food. ?Limit the amount of salt in your diet to less than 1,500 mg each day. ?Do not drink alcohol if: ?Your doctor tells you not to drink. ?You are pregnant, may be pregnant, or are planning to become pregnant. ?If you drink alcohol: ?Limit how much you have to: ?0-1 drink a day for women. ?0-2 drinks a day for men. ?Know how much alcohol is in your drink. In the [place] one drink equals one 12 oz bottle of beer (355 mL), one 5 oz glass of wine (148 mL), or one 1? oz glass of hard liquor (44 mL). ?Lifestyle ? ?Work with your doctor to stay at a healthy weight or to lose weight. Ask your doctor what the best weight is for you. ?Get at least 30 minutes of exercise that causes your heart to beat faster (aerobic exercise) most days of the week. This may include walking, swimming, or biking. ?Get at least 30 minutes of exercise that strengthens your muscles (resistance exercise) at least 3 days a week. This may include lifting weights or doing Pilates. ?Do not smoke or use any products that contain nicotine or tobacco. If you need help quitting, ask your doctor. ?Check your blood pressure at home as told by your doctor. ?Keep all follow-up visits. ?Medicines ?Take over-the-counter and prescription medicines   only as told by your doctor. Follow directions carefully. ?Do not skip doses of blood pressure medicine. The medicine does not work as well if you skip doses. Skipping doses also puts you at risk for problems. ?Ask your doctor about side effects or reactions to medicines that you should watch  for. ?Contact a doctor if: ?You think you are having a reaction to the medicine you are taking. ?You have headaches that keep coming back. ?You feel dizzy. ?You have swelling in your ankles. ?You have trouble with your vision. ?Get help right away if: ?You get a very bad headache. ?You start to feel mixed up (confused). ?You feel weak or numb. ?You feel faint. ?You have very bad pain in your: ?Chest. ?Belly (abdomen). ?You vomit more than once. ?You have trouble breathing. ?These symptoms may be an emergency. Get help right away. Call 911. ?Do not wait to see if the symptoms will go away. ?Do not drive yourself to the hospital. ?Summary ?Hypertension is another name for high blood pressure. ?High blood pressure forces your heart to work harder to pump blood. ?For most people, a normal blood pressure is less than 120/80. ?Making healthy choices can help lower blood pressure. If your blood pressure does not get lower with healthy choices, you may need to take medicine. ?This information is not intended to replace advice given to you by your health care provider. Make sure you discuss any questions you have with your health care provider. ?Document Revised: 01/28/2021 Document Reviewed: 01/28/2021 ?Elsevier Patient Education ? 2023 [employer] ? ?

## 2021-11-30 NOTE — Progress Notes (Signed)
Abdomnial x-ray   Established Patient Office Visit  Subjective   Patient ID: Kelly Jennings, female    DOB: [birth year]  Age: 41 y.o. MRN: [id number]  Chief Complaint  Patient presents with   Medication Refill   Medical Management of Chronic Issues    1 year    HPI  Pt presents for follow up of hypertension. Patient was diagnosed in 09/25/2022 The patient is tolerating the medication well without side effects. Compliance with treatment has been good; including taking medication as directed , maintains a healthy diet and regular exercise regimen , and following up as directed.   Depression, Follow-up  She  was last seen for this 1 years ago. Changes made at last visit include Protonix 20 mg tablet by mouth Daily.   She reports good compliance with treatment. She is not having side effects.   She reports good tolerance of treatment. Current symptoms include: depressed mood She feels she is Improved since last visit.     11/30/2021    9:39 AM 08/17/2020    8:12 AM 05/19/2020    9:41 AM  Depression screen PHQ 2/9  Decreased Interest 2 0 3  Down, Depressed, Hopeless 1 0 0  PHQ - 2 Score 3 0 3  Altered sleeping _0 Tired, decreased energy _1 Change in appetite 3 0 3  Feeling bad or failure about yourself  0 0 3  Trouble concentrating _2 Moving slowly or fidgety/restless 1 0 0  Suicidal thoughts 0 0 0  PHQ-9 Score _3 Difficult doing work/chores Somewhat difficult Not difficult at all Very difficult    GERD, Follow up:  The patient was last seen for GERD 12 months ago. Changes made since that visit include protonix.  She reports good compliance with treatment. She is not having side effects. .  She IS experiencing chest pain. She is NOT experiencing choking on food, cough, or deep pressure at base of neck    Patient Active Problem List   Diagnosis Date Noted   Restless leg syndrome 11/16/2020   Anxiety 05/19/2020   Abdominal obesity and metabolic  syndrome 48/25/0037   Prediabetes 01/29/2020   Depression, recurrent ([facility]) 01/23/2020   Class 2 obesity due to excess calories without serious comorbidity with body mass index (BMI) of 39.0 to 39.9 in adult 02/19/2019   Essential hypertension, benign 02/19/2019   Vitamin D deficiency 02/19/2019   Vasodepressor syncope 06/12/2013   HTN (hypertension) 09/24/2012   GERD (gastroesophageal reflux disease) 09/24/2012   Past Medical History:  Diagnosis Date   GERD (gastroesophageal reflux disease)    Hypertension    Migraines    Past Surgical History:  Procedure Laterality Date   CHOLECYSTECTOMY     Social History   Tobacco Use   Smoking status: Never   Smokeless tobacco: Never  Vaping Use   Vaping Use: Never used  Substance Use Topics   Alcohol use: No   Drug use: No   Social History   Socioeconomic History   Marital status: Married    Spouse name: Not on file   Number of children: Not on file   Years of education: Not on file   Highest education level: Not on file  Occupational History   Not on file  Tobacco Use   Smoking status: Never   Smokeless tobacco: Never  Vaping Use   Vaping Use: Never used  Substance and Sexual Activity   Alcohol use:  No   Drug use: No   Sexual activity: Not on file  Other Topics Concern   Not on file  Social History Narrative   Not on file   Social Determinants of Health   Financial Resource Strain: Not on file  Food Insecurity: Not on file  Transportation Needs: Not on file  Physical Activity: Not on file  Stress: Not on file  Social Connections: Not on file  Intimate Partner Violence: Not on file   Family Status  Relation Name Status   Mother  Alive   Father  Alive      ROS    Objective:     BP 129/83   Pulse 69   Temp 98.5 F (36.9 C)   Ht _0  (1.575 m)   Wt 230 lb (104.3 kg)   LMP 11/22/2021 (Approximate) Comment: end date  SpO2 96%   BMI 42.07 kg/m  BP Readings from Last 3 Encounters:  11/30/21  129/83  06/07/21 119/81  05/25/21 (!) 131/115   Wt Readings from Last 3 Encounters:  11/30/21 230 lb (104.3 kg)  06/07/21 229 lb 6.4 oz (104.1 kg)  05/25/21 220 lb (99.8 kg)      Physical Exam   Results for orders placed or performed in visit on 11/30/21  Bayer DCA Hb A1c Waived  Result Value Ref Range   HB A1C (BAYER DCA - WAIVED) 5.6 4.8 - 5.6 %    Last CBC Lab Results  Component Value Date   WBC 9.6 05/25/2021   HGB 13.1 05/25/2021   HCT 40.2 05/25/2021   MCV 93.9 05/25/2021   MCH 30.6 05/25/2021   RDW 12.6 05/25/2021   PLT 309 80/88/1103   Last metabolic panel Lab Results  Component Value Date   GLUCOSE 123 (H) 05/25/2021   NA 135 05/25/2021   K 3.7 05/25/2021   CL 103 05/25/2021   CO2 22 05/25/2021   BUN 11 05/25/2021   CREATININE 0.92 05/25/2021   GFRNONAA >60 05/25/2021   CALCIUM 9.0 05/25/2021   PROT 7.3 05/25/2021   ALBUMIN 4.1 05/25/2021   LABGLOB 2.3 11/16/2020   AGRATIO 1.9 11/16/2020   BILITOT 0.5 05/25/2021   ALKPHOS 72 05/25/2021   AST 25 05/25/2021   ALT 34 05/25/2021   ANIONGAP 10 05/25/2021   Last lipids Lab Results  Component Value Date   CHOL 180 11/16/2020   HDL 43 11/16/2020   LDLCALC 99 11/16/2020   TRIG 220 (H) 11/16/2020   CHOLHDL 4.2 11/16/2020   Last hemoglobin A1c Lab Results  Component Value Date   HGBA1C 5.6 11/30/2021   Last thyroid functions Lab Results  Component Value Date   TSH 1.030 05/19/2020   T4TOTAL 8.4 01/18/2019   Last vitamin D Lab Results  Component Value Date   VD25OH 27.3 (L) 01/18/2019      The 10-year ASCVD risk score (Arnett DK, et al., 2019) is: 1.1%    Assessment & Plan:   Problem List Items Addressed This Visit       Cardiovascular and Mediastinum   HTN (hypertension)    Blood pressure well controlled, no changes needed.       Relevant Medications   furosemide (LASIX) 20 MG tablet   lisinopril-hydrochlorothiazide (ZESTORETIC) 20-25 MG tablet     Digestive   GERD  (gastroesophageal reflux disease)    Symptoms well controlled , no changes necessary       Relevant Medications   pantoprazole (PROTONIX) 20 MG tablet  Other   Class 2 obesity due to excess calories without serious comorbidity with body mass index (BMI) of 39.0 to 39.9 in adult   Relevant Medications   Liraglutide -Weight Management (SAXENDA) 18 MG/3ML SOPN   Depression, recurrent ([facility])    Symptoms well controlled on current medication, follow up in 6 months or as needed,      Relevant Medications   escitalopram (LEXAPRO) 5 MG tablet   Anxiety   Relevant Medications   escitalopram (LEXAPRO) 5 MG tablet   Abdominal obesity and metabolic syndrome   Relevant Orders   Lipid panel   CBC with Differential/Platelet   CMP14+EGFR   HIV Antibody (routine testing w rflx)   Hepatitis C Antibody   Thyroid Panel With TSH   VITAMIN D 25 Hydroxy (Vit-D Deficiency, Fractures)   Restless leg syndrome   Relevant Medications   gabapentin (NEURONTIN) 100 MG capsule   Other Visit Diagnoses     Elevated glucose    -  Primary   Relevant Orders   Bayer DCA Hb A1c Waived (Completed)   Essential hypertension       Relevant Medications   furosemide (LASIX) 20 MG tablet   lisinopril-hydrochlorothiazide (ZESTORETIC) 20-25 MG tablet   Left upper quadrant abdominal pain       Relevant Orders   DG Abd 2 Views       Return in about 6 months (around 06/02/2022) for chronic disease management.    Ivy Lynn, NP

## 2021-11-30 NOTE — Assessment & Plan Note (Signed)
Symptoms well controlled on current medication, follow up in 6 months or as needed,

## 2021-12-01 ENCOUNTER — Telehealth: Payer: Self-pay

## 2021-12-01 LAB — CMP14+EGFR
ALT: 32 IU/L (ref 0–32)
AST: 25 IU/L (ref 0–40)
Albumin/Globulin Ratio: 1.6 (ref 1.2–2.2)
Albumin: 4.4 g/dL (ref 3.9–4.9)
Alkaline Phosphatase: 78 IU/L (ref 44–121)
BUN/Creatinine Ratio: 18 (ref 9–23)
BUN: 14 mg/dL (ref 6–24)
Bilirubin Total: 0.5 mg/dL (ref 0.0–1.2)
CO2: 20 mmol/L (ref 20–29)
Calcium: 9.5 mg/dL (ref 8.7–10.2)
Chloride: 101 mmol/L (ref 96–106)
Creatinine, Ser: 0.77 mg/dL (ref 0.57–1.00)
Globulin, Total: 2.8 g/dL (ref 1.5–4.5)
Glucose: 101 mg/dL — ABNORMAL HIGH (ref 70–99)
Potassium: 4.5 mmol/L (ref 3.5–5.2)
Sodium: 137 mmol/L (ref 134–144)
Total Protein: 7.2 g/dL (ref 6.0–8.5)
eGFR: 99 mL/min/{1.73_m2} (ref >59)

## 2021-12-01 LAB — CBC WITH DIFFERENTIAL/PLATELET
Basophils Absolute: 0.1 10*3/uL (ref 0.0–0.2)
Basos: 1 %
EOS (ABSOLUTE): 0.3 10*3/uL (ref 0.0–0.4)
Eos: 3 %
Hematocrit: 38.4 % (ref 34.0–46.6)
Hemoglobin: 13.2 g/dL (ref 11.1–15.9)
Immature Grans (Abs): 0 10*3/uL (ref 0.0–0.1)
Immature Granulocytes: 1 %
Lymphocytes Absolute: 2.2 10*3/uL (ref 0.7–3.1)
Lymphs: 26 %
MCH: 31.4 pg (ref 26.6–33.0)
MCHC: 34.4 g/dL (ref 31.5–35.7)
MCV: 91 fL (ref 79–97)
Monocytes Absolute: 0.6 10*3/uL (ref 0.1–0.9)
Monocytes: 7 %
Neutrophils Absolute: 5.2 10*3/uL (ref 1.4–7.0)
Neutrophils: 62 %
Platelets: 297 10*3/uL (ref 150–450)
RBC: 4.21 x10E6/uL (ref 3.77–5.28)
RDW: 13.1 % (ref 11.7–15.4)
WBC: 8.3 10*3/uL (ref 3.4–10.8)

## 2021-12-01 LAB — HEPATITIS C ANTIBODY: Hep C Virus Ab: NONREACTIVE

## 2021-12-01 LAB — VITAMIN D 25 HYDROXY (VIT D DEFICIENCY, FRACTURES): Vit D, 25-Hydroxy: 44.9 ng/mL (ref 30.0–100.0)

## 2021-12-01 LAB — LIPID PANEL
Chol/HDL Ratio: 4.5 ratio — ABNORMAL HIGH (ref 0.0–4.4)
Cholesterol, Total: 216 mg/dL — ABNORMAL HIGH (ref 100–199)
HDL: 48 mg/dL (ref >39)
LDL Chol Calc (NIH): 136 mg/dL — ABNORMAL HIGH (ref 0–99)
Triglycerides: 178 mg/dL — ABNORMAL HIGH (ref 0–149)
VLDL Cholesterol Cal: 32 mg/dL (ref 5–40)

## 2021-12-01 LAB — THYROID PANEL WITH TSH
Free Thyroxine Index: 2.3 (ref 1.2–4.9)
T3 Uptake Ratio: 26 % (ref 24–39)
T4, Total: 8.7 ug/dL (ref 4.5–12.0)
TSH: 1.21 u[IU]/mL (ref 0.450–4.500)

## 2021-12-01 LAB — HIV ANTIBODY (ROUTINE TESTING W REFLEX): HIV Screen 4th Generation wRfx: NONREACTIVE

## 2021-12-01 NOTE — Telephone Encounter (Signed)
ELLIYAH LISZEWSKI (Key: [id number]) Rx #: [id number] Saxenda 18MG pen-injectors   Form Ronny Bacon PA Form [phone] NCPDP) Created 1 day ago Sent to Plan 3 minutes ago Plan Response 2 minutes ago Submit Clinical Questions less than a minute ago Determination Wait for Determination Please wait for [employer] NCPDP 2017 to return a determination.

## 2021-12-02 NOTE — Telephone Encounter (Signed)
SHEALEIGH DUNSTAN (Key: [id number]) Rx #: [id number] Saxenda 18MG pen-injectors   Form Ronny Bacon PA Form (2017 NCPDP) Created 2 days ago Sent to Plan 1 day ago Plan Response 1 day ago Submit Clinical Questions 1 day ago Determination Favorable 23 hours ago Your prior authorization for 02-12-2005 has been approved! MORE INFO For eligible patients, copay assistance may be available. To learn more and be redirected to the [facility] website, click on the "More Info" button to the right. Please also note that you may need to schedule a follow-up visit with your patient prior to the expiration of this prior authorization, as updated patient weight may be required for reauthorization.  Message from plan: Your PA request has been approved. Additional information will be provided in the approval communication. (Message 1145)  Pharmacy aware

## 2021-12-20 ENCOUNTER — Ambulatory Visit (INDEPENDENT_AMBULATORY_CARE_PROVIDER_SITE_OTHER): Payer: BC Managed Care – PPO | Admitting: Family Medicine

## 2021-12-20 DIAGNOSIS — J019 Acute sinusitis, unspecified: Secondary | ICD-10-CM | POA: Diagnosis not present

## 2021-12-20 DIAGNOSIS — R058 Other specified cough: Secondary | ICD-10-CM | POA: Diagnosis not present

## 2021-12-20 DIAGNOSIS — B9689 Other specified bacterial agents as the cause of diseases classified elsewhere: Secondary | ICD-10-CM | POA: Diagnosis not present

## 2021-12-20 MED ORDER — CEFDINIR 300 MG PO CAPS: 300.0000 mg | ORAL_CAPSULE | Freq: Two times a day (BID) | ORAL | 0 refills | Status: DC

## 2021-12-20 MED ORDER — HYDROCOD POLI-CHLORPHE POLI ER 10-8 MG/5ML PO SUER: 5.0000 mL | Freq: Two times a day (BID) | ORAL | 0 refills | Status: DC | PRN

## 2021-12-20 MED ORDER — BENZONATATE 100 MG PO CAPS: 100.0000 mg | ORAL_CAPSULE | Freq: Three times a day (TID) | ORAL | 0 refills | Status: DC | PRN

## 2021-12-20 NOTE — Progress Notes (Signed)
Telephone visit  Subjective: CC: ill PCP: Daryll Drown, NP BTD:[id number] D Markie is a 41 y.o. female calls for telephone consult today. Patient provides verbal consent for consult held via phone.  Due to COVID-19 pandemic this visit was conducted virtually. This visit type was conducted due to national recommendations for restrictions regarding the COVID-19 Pandemic (e.g. social distancing, sheltering in place) in an effort to limit this patient's exposure and mitigate transmission in our community. All issues noted in this document were discussed and addressed.  A physical exam was not performed with this format.   Location of patient: work Location of provider: [facility] Others present for call: none  1. Ill Patient reports that she started having cough, sweating, headache that onset Sunday.  She reports that she is now having a productive cough.  She has tested negative for COVID19 x2.  She denies hemoptysis.  She reports that her nasal discharge is thick/ green/ yellow.  No measured fevers.  LMP 12/07/2021.   ROS: Per HPI  Allergies  Allergen Reactions   Penicillins    Past Medical History:  Diagnosis Date   GERD (gastroesophageal reflux disease)    Hypertension    Migraines     Current Outpatient Medications:    escitalopram (LEXAPRO) 5 MG tablet, Take 1 tablet (5 mg total) by mouth daily., Disp: 90 tablet, Rfl: 1   furosemide (LASIX) 20 MG tablet, Take 1-2 tablets (20-40 mg total) by mouth daily., Disp: 180 tablet, Rfl: 0   gabapentin (NEURONTIN) 100 MG capsule, Take 1 capsule (100 mg total) by mouth 3 (three) times daily. (NEEDS TO BE SEEN BEFORE NEXT REFILL), Disp: 90 capsule, Rfl: 0   Liraglutide -Weight Management (SAXENDA) 18 MG/3ML SOPN, Inject 0.6 mg into the skin daily. Initiate at 0.6 mg subcu every day for 1 week, increase by 0.6 mg/day and weekly intervals until a dose of 3 mg/day achieved, Disp: 3 mL, Rfl: 2   lisinopril-hydrochlorothiazide (ZESTORETIC) 20-25 MG  tablet, Take 1 tablet by mouth daily. (NEEDS TO BE SEEN BEFORE NEXT REFILL), Disp: 30 tablet, Rfl: 0   pantoprazole (PROTONIX) 20 MG tablet, Take 1 tablet (20 mg total) by mouth daily. (NEEDS TO BE SEEN BEFORE NEXT REFILL), Disp: 30 tablet, Rfl: 0  Assessment/ Plan: 41 y.o. female   Acute bacterial sinusitis - Plan: cefdinir (OMNICEF) 300 MG capsule, benzonatate (TESSALON PERLES) 100 MG capsule, chlorpheniramine-HYDROcodone (TUSSIONEX) 10-8 MG/5ML  Productive cough - Plan: cefdinir (OMNICEF) 300 MG capsule, benzonatate (TESSALON PERLES) 100 MG capsule, chlorpheniramine-HYDROcodone (TUSSIONEX) 10-8 MG/5ML  Suspect that this is in fact viral currently but given some of the change in progression of her symptoms I am going to empirically treat for bacterial.  I given her a pocket prescription for Omnicef and she will start this in the next 48 hours if she is not seeing significant improvement with nasal sprays, oral antihistamines and cough suppressants.  We discussed red flag signs and symptoms warranting further evaluation.  She may follow-up as needed   The Narcotic Database has been reviewed.  There were no red flags.    Start time: 1:34pm End time: 1:41pm  Total time spent on patient care (including telephone call/ virtual visit): 7 minutes  Celita Aron Hulen Skains, DO [facility] [phone]

## 2021-12-24 ENCOUNTER — Other Ambulatory Visit: Payer: Self-pay | Admitting: Nurse Practitioner

## 2021-12-24 DIAGNOSIS — I10 Essential (primary) hypertension: Secondary | ICD-10-CM

## 2021-12-24 DIAGNOSIS — K2100 Gastro-esophageal reflux disease with esophagitis, without bleeding: Secondary | ICD-10-CM

## 2022-02-22 NOTE — Progress Notes (Unsigned)
Cardiology Office Note:    Date:  02/23/2022   ID:  Kelly Jennings, DOB October 23, 1980, MRN [id number]  PCP:  Daryll Drown, NP   [facility] Providers Cardiologist:  Maisie Fus, MD     Referring MD: Daryll Drown, NP   Chief Complaint  Patient presents with   Follow-up    Neck and jaw pain, SOB    History of Present Illness:    Kelly Jennings is a 41 y.o. female with a hx of HTN, GERD, migraines, syncope, prediabetes, and obesity.  She has a history of syncope with her last episode 05/25/2021.  He has a history of unremarkable echocardiogram.  She is seeing Dr. Ladona Ridgel for possible autonomic dysfunction.  She reportedly has been told she has vasovagal syncope.  Heart monitor 5 years ago did not show significant arrhythmias or pauses.  She was seen by Dr. Wyline Mood in clinic 06/07/2021 and was doing well after her event in January.  Triggers for her syncope may be psychosomatic and/or migraines.  Discussed as needed Zofran Benadryl along with hydration.  She presents today for follow-up.  For the last 20 days she has had a pain in her right neck that radiates to her jaw and right arm with pain behind her eyes. She gets short of breath more easily, DOE with very light exertion. She states she has neck and jaw pain with anxiety, but it has also woken her from sleep. She is active with walking with her job and does not have neck and jaw pain with walking. She feels that laying on her back is suffocating and she cna't breathe - but this is not every night. This occurs three times per week. She is very frustrated and states she wants something more than an EKG done.   Past Medical History:  Diagnosis Date   GERD (gastroesophageal reflux disease)    Hypertension    Migraines     Past Surgical History:  Procedure Laterality Date   CHOLECYSTECTOMY      Current Medications: Current Meds  Medication Sig   escitalopram (LEXAPRO) 5 MG tablet Take 1 tablet (5 mg total) by mouth  daily.   furosemide (LASIX) 20 MG tablet Take 1-2 tablets (20-40 mg total) by mouth daily.   lisinopril-hydrochlorothiazide (ZESTORETIC) 20-25 MG tablet TAKE 1 TABLET BY MOUTH DAILY. (NEEDS TO BE SEEN BEFORE NEXT REFILL)   metoprolol tartrate (LOPRESSOR) 100 MG tablet Take 1 tablet 2 hours before CT   pantoprazole (PROTONIX) 20 MG tablet TAKE 1 TABLET (20 MG TOTAL) BY MOUTH DAILY. (NEEDS TO BE SEEN BEFORE NEXT REFILL)     Allergies:   Penicillins   Social History   Socioeconomic History   Marital status: Married    Spouse name: Not on file   Number of children: Not on file   Years of education: Not on file   Highest education level: Not on file  Occupational History   Not on file  Tobacco Use   Smoking status: Never   Smokeless tobacco: Never  Vaping Use   Vaping Use: Never used  Substance and Sexual Activity   Alcohol use: No   Drug use: No   Sexual activity: Not on file  Other Topics Concern   Not on file  Social History Narrative   Not on file   Social Determinants of Health   Financial Resource Strain: Not on file  Food Insecurity: Not on file  Transportation Needs: Not on file  Physical  Activity: Not on file  Stress: Not on file  Social Connections: Not on file     Family History: The patient's family history includes Diabetes in her father; Hyperlipidemia in her father and mother; Hypertension in her father and mother.  ROS:   Please see the history of present illness.     All other systems reviewed and are negative.  EKGs/Labs/Other Studies Reviewed:    The following studies were reviewed today:  Echo 01/2019: 1. Left ventricular ejection fraction, by visual estimation, is 60 to  65%. The left ventricle has normal function. Normal left ventricular size.  There is no left ventricular hypertrophy.   2. Global right ventricle has normal systolic function.The right  ventricular size is normal. No increase in right ventricular wall  thickness.   3. Left  atrial size was normal.   4. Right atrial size was normal.   5. The mitral valve is normal in structure. No evidence of mitral valve  regurgitation. No evidence of mitral stenosis.   6. The tricuspid valve is normal in structure. Tricuspid valve  regurgitation was not visualized by color flow Doppler.   7. The aortic valve is normal in structure. Aortic valve regurgitation  was not visualized by color flow Doppler. Structurally normal aortic  valve, with no evidence of sclerosis or stenosis.   8. The pulmonic valve was normal in structure. Pulmonic valve  regurgitation is not visualized by color flow Doppler.   9. Normal pulmonary artery systolic pressure.  10. The inferior vena cava is normal in size with greater than 50%  respiratory variability, suggesting right atrial pressure of 3 mmHg.   EKG:  EKG is  ordered today.  The ekg ordered today demonstrates sinus rhythm HR 80  Recent Labs: 11/30/2021: ALT 32; BUN 14; Creatinine, Ser 0.77; Hemoglobin 13.2; Platelets 297; Potassium 4.5; Sodium 137; TSH 1.210  Recent Lipid Panel    Risk Assessment/Calculations:      Physical Exam:    VS:  BP 132/89   Pulse 76   Ht 5\' 2"  (1.575 m)   Wt 234 lb 9.6 oz (106.4 kg)   SpO2 99%   BMI 42.91 kg/m     Wt Readings from Last 3 Encounters:  02/23/22 234 lb 9.6 oz (106.4 kg)  11/30/21 230 lb (104.3 kg)  06/07/21 229 lb 6.4 oz (104.1 kg)     GEN:  Well nourished, well developed in no acute distress HEENT: Normal NECK: No JVD; No carotid bruits LYMPHATICS: No lymphadenopathy CARDIAC: RRR, no murmurs, rubs, gallops RESPIRATORY:  Clear to auscultation without rales, wheezing or rhonchi  ABDOMEN: Soft, non-tender, non-distended MUSCULOSKELETAL:  No edema; No deformity  SKIN: Warm and dry NEUROLOGIC:  Alert and oriented x 3 PSYCHIATRIC:  Normal affect   ASSESSMENT:    1. Precordial chest pain   2. Syncope and collapse   3. Essential hypertension, benign   4. Hyperlipidemia,  unspecified hyperlipidemia type    PLAN:    In order of problems listed above:  Neck, arm, and jaw pain She also has headache with this Symptoms at rest, not exertional, sound largely atypical, but she knows something is "off" Will obtain a CT coronary   Syncope She thinks she has POTS based on social media. Last syncopal episode was in August 2023.   Hypertension BP controlled on current regimen.    Hyperlipidemia  11/30/2021: Cholesterol, Total 216; HDL 48; LDL Chol Calc (NIH) 136; Triglycerides 178 Consider adding crestor based on CT results  Follow up after CT coronary.    Medication Adjustments/Labs and Tests Ordered: Current medicines are reviewed at length with the patient today.  Concerns regarding medicines are outlined above.  Orders Placed This Encounter  Procedures   CT CORONARY MORPH W/CTA COR W/SCORE W/CA W/CM &/OR WO/CM   Basic metabolic panel   EKG 12-Lead   Meds ordered this encounter  Medications   metoprolol tartrate (LOPRESSOR) 100 MG tablet    Sig: Take 1 tablet 2 hours before CT    Dispense:  1 tablet    Refill:  0    Patient Instructions  Medication Instructions:  TAKE Metoprolol Tartrate (Lopressor) 100 mg 2 hours before CT *If you need a refill on your cardiac medications before your next appointment, please call your pharmacy*  Lab Work: Your physician recommends that you return for lab work 1 week prior Cardiac CT:   If you have labs (blood work) drawn today and your tests are completely normal, you will receive your results only by: MyChart Message (if you have MyChart) OR A paper copy in the mail If you have any lab test that is abnormal or we need to change your treatment, we will call you to review the results.  Testing/Procedures:  Your physician has requested that you have cardiac CT. Cardiac computed tomography (CT) is a painless test that uses an x-ray machine to take clear, detailed pictures of your heart. For further  information please visit [email or web address] Please follow instruction sheet as given.   Follow-Up: At [facility], you and your health needs are our priority.  As part of our continuing mission to provide you with exceptional heart care, we have created designated Provider Care Teams.  These Care Teams include your primary Cardiologist (physician) and Advanced Practice Providers (APPs -  Physician Assistants and Nurse Practitioners) who all work together to provide you with the care you need, when you need it.  Your next appointment:   After Cardiac CT   The format for your next appointment:   In Person  Provider:   Maisie Fus, MD  or  APP         Other Instructions   Your cardiac CT will be scheduled at one of the below locations:   [facility] [address] [place], [place] [address] [phone]  OR  [facility] [address] [place], [place] [address] [phone]  OR   [facility] [address] [place], [place] [address] [phone]  If scheduled at [facility], please arrive at the [facility] and Children's Entrance (Entrance C2) of [facility] 30 minutes prior to test start time. You can use the FREE valet parking offered at entrance C (encouraged to control the heart rate for the test)  Proceed to the [facility] Radiology Department (first floor) to check-in and test prep.  All radiology patients and guests should use entrance C2 at [facility], accessed from [facility], even though the hospital's physical address listed is [address].    If scheduled at [facility] or [facility], please arrive 15 mins early for check-in and test prep.   Please follow these instructions carefully (unless otherwise directed):  On the Night Before the Test: Be sure to Drink plenty  of water. Do not consume any caffeinated/decaffeinated beverages or chocolate 12 hours prior to your test. Do not take any antihistamines 12  hours prior to your test. If the patient has contrast allergy: Patient will need a prescription for Prednisone and very clear instructions (as follows): Prednisone 50 mg - take 13 hours prior to test Take another Prednisone 50 mg 7 hours prior to test Take another Prednisone 50 mg 1 hour prior to test Take Benadryl 50 mg 1 hour prior to test Patient must complete all four doses of above prophylactic medications. Patient will need a ride after test due to Benadryl.  On the Day of the Test: Drink plenty of water until 1 hour prior to the test. Do not eat any food 1 hour prior to test. You may take your regular medications prior to the test.  Take metoprolol (Lopressor) two hours prior to test. HOLD Furosemide/Hydrochlorothiazide morning of the test. FEMALES- please wear underwire-free bra if available, avoid dresses & tight clothing  After the Test: Drink plenty of water. After receiving IV contrast, you may experience a mild flushed feeling. This is normal. On occasion, you may experience a mild rash up to 24 hours after the test. This is not dangerous. If this occurs, you can take Benadryl 25 mg and increase your fluid intake. If you experience trouble breathing, this can be serious. If it is severe call 911 IMMEDIATELY. If it is mild, please call our office. If you take any of these medications: Glipizide/Metformin, Avandament, Glucavance, please do not take 48 hours after completing test unless otherwise instructed.  We will call to schedule your test 2-4 weeks out understanding that some insurance companies will need an authorization prior to the service being performed.   For non-scheduling related questions, please contact the cardiac imaging nurse navigator should you have any questions/concerns: Marchia Bond, Cardiac Imaging Nurse  Navigator Gordy Clement, Cardiac Imaging Nurse Navigator [facility] Direct Office Dial: [phone]   For scheduling needs, including cancellations and rescheduling, please call [place], [phone].  Important Information About Sugar         Signed, Ledora Bottcher, [place]  02/23/2022 4:49 PM    [facility]

## 2022-02-23 ENCOUNTER — Encounter: Payer: Self-pay | Admitting: Physician Assistant

## 2022-02-23 ENCOUNTER — Ambulatory Visit: Payer: BC Managed Care – PPO | Attending: Physician Assistant | Admitting: Physician Assistant

## 2022-02-23 VITALS — BP 132/89 | HR 76 | Ht 62.0 in | Wt 234.6 lb

## 2022-02-23 DIAGNOSIS — R072 Precordial pain: Secondary | ICD-10-CM | POA: Diagnosis not present

## 2022-02-23 DIAGNOSIS — E785 Hyperlipidemia, unspecified: Secondary | ICD-10-CM | POA: Diagnosis not present

## 2022-02-23 DIAGNOSIS — I10 Essential (primary) hypertension: Secondary | ICD-10-CM | POA: Diagnosis not present

## 2022-02-23 DIAGNOSIS — R55 Syncope and collapse: Secondary | ICD-10-CM

## 2022-02-23 MED ORDER — METOPROLOL TARTRATE 100 MG PO TABS: ORAL_TABLET | ORAL | 0 refills | Status: DC

## 2022-02-23 NOTE — Patient Instructions (Addendum)
Medication Instructions:  TAKE Metoprolol Tartrate (Lopressor) 100 mg 2 hours before CT *If you need a refill on your cardiac medications before your next appointment, please call your pharmacy*  Lab Work: Your physician recommends that you return for lab work 1 week prior Cardiac CT:   If you have labs (blood work) drawn today and your tests are completely normal, you will receive your results only by: [place] (if you have MyChart) OR A paper copy in the mail If you have any lab test that is abnormal or we need to change your treatment, we will call you to review the results.  Testing/Procedures:  Your physician has requested that you have cardiac CT. Cardiac computed tomography (CT) is a painless test that uses an x-ray machine to take clear, detailed pictures of your heart. For further information please visit [email or web address]. Please follow instruction sheet as given.   Follow-Up: At [facility], you and your health needs are our priority.  As part of our continuing mission to provide you with exceptional heart care, we have created designated Provider Care Teams.  These Care Teams include your primary Cardiologist (physician) and Advanced Practice Providers (APPs -  Physician Assistants and Nurse Practitioners) who all work together to provide you with the care you need, when you need it.  Your next appointment:   After Cardiac CT   The format for your next appointment:   In Person  Provider:   Janina Mayo, MD  or  APP         Other Instructions   Your cardiac CT will be scheduled at one of the below locations:   [facility] [address] [place], [place] [address] [phone]  [place] [address] [place], [place] [address] [phone]  [facility] [place], [place] [address] [phone]  If scheduled at [facility], please arrive at the [facility] and Children's Entrance (Entrance C2) of [facility] 30 minutes prior to test start time. You can use the FREE valet parking offered at entrance C (encouraged to control the heart rate for the test)  Proceed to the [facility] Radiology Department (first floor) to check-in and test prep.  All radiology patients and guests should use entrance C2 at [facility], accessed from [facility], even though the hospital's physical address listed is [address].    If scheduled at [facility] or [facility], please arrive 15 mins early for check-in and test prep.   Please follow these instructions carefully (unless otherwise directed):  On the Night Before the Test: Be sure to Drink plenty of water. Do not consume any caffeinated/decaffeinated beverages or chocolate 12 hours prior to your test. Do not take any antihistamines 12 hours prior to your test. If the patient has contrast allergy: Patient will need a prescription for Prednisone and very clear instructions (as follows): Prednisone 50 mg - take 13 hours prior to test Take another Prednisone 50 mg 7 hours prior to test Take another Prednisone 50 mg 1 hour prior to test Take Benadryl 50 mg 1 hour prior to test Patient must complete all four doses of above prophylactic medications. Patient will need a ride after test due to Benadryl.  On the Day of the Test: Drink plenty of water until 1 hour prior to the test. Do not eat any  food 1 hour prior to test. You may take your regular medications prior to the test.  Take metoprolol (Lopressor) two hours prior to test. HOLD Furosemide/Hydrochlorothiazide morning of the test. FEMALES- please wear underwire-free bra if available, avoid dresses & tight clothing  After the Test: Drink plenty of water. After receiving IV contrast, you may experience a mild flushed feeling. This  is normal. On occasion, you may experience a mild rash up to 24 hours after the test. This is not dangerous. If this occurs, you can take Benadryl 25 mg and increase your fluid intake. If you experience trouble breathing, this can be serious. If it is severe call 911 IMMEDIATELY. If it is mild, please call our office. If you take any of these medications: Glipizide/Metformin, Avandament, Glucavance, please do not take 48 hours after completing test unless otherwise instructed.  We will call to schedule your test 2-4 weeks out understanding that some insurance companies will need an authorization prior to the service being performed.   For non-scheduling related questions, please contact the cardiac imaging nurse navigator should you have any questions/concerns: Rockwell Alexandria, Cardiac Imaging Nurse Navigator Larey Brick, Cardiac Imaging Nurse Navigator [facility] Direct Office Dial: [phone]   For scheduling needs, including cancellations and rescheduling, please call [place], [phone].  Important Information About Sugar

## 2022-02-26 ENCOUNTER — Other Ambulatory Visit: Payer: Self-pay | Admitting: Family Medicine

## 2022-02-26 DIAGNOSIS — I10 Essential (primary) hypertension: Secondary | ICD-10-CM

## 2022-03-03 ENCOUNTER — Other Ambulatory Visit: Payer: BC Managed Care – PPO

## 2022-03-04 LAB — BASIC METABOLIC PANEL
BUN/Creatinine Ratio: 16 (ref 9–23)
BUN: 14 mg/dL (ref 6–24)
CO2: 21 mmol/L (ref 20–29)
Calcium: 9.2 mg/dL (ref 8.7–10.2)
Chloride: 101 mmol/L (ref 96–106)
Creatinine, Ser: 0.88 mg/dL (ref 0.57–1.00)
Glucose: 88 mg/dL (ref 70–99)
Potassium: 4.4 mmol/L (ref 3.5–5.2)
Sodium: 138 mmol/L (ref 134–144)
eGFR: 84 mL/min/{1.73_m2} (ref >59)

## 2022-03-09 ENCOUNTER — Telehealth (HOSPITAL_COMMUNITY): Payer: Self-pay | Admitting: Emergency Medicine

## 2022-03-09 NOTE — Telephone Encounter (Signed)
Reaching out to patient to offer assistance regarding upcoming cardiac imaging study; pt verbalizes understanding of appt date/time, parking situation and where to check in, pre-test NPO status and medications ordered, and verified current allergies; name and call back number provided for further questions should they arise Ardith Test RN Navigator Cardiac Imaging [facility] [phone] office [phone] cell   100mg metoprolol tartrate  

## 2022-03-10 ENCOUNTER — Ambulatory Visit (HOSPITAL_COMMUNITY)
Admission: RE | Admit: 2022-03-10 | Discharge: 2022-03-10 | Disposition: A | Discharge status: Home / Self Care | Payer: BC Managed Care – PPO | Source: Ambulatory Visit | Attending: Physician Assistant | Admitting: Physician Assistant

## 2022-03-10 DIAGNOSIS — R072 Precordial pain: Secondary | ICD-10-CM | POA: Diagnosis present

## 2022-03-10 MED ORDER — NITROGLYCERIN 0.4 MG SL SUBL
SUBLINGUAL_TABLET | SUBLINGUAL | Status: AC
  Filled 2022-03-10: qty 2

## 2022-03-10 MED ORDER — IOHEXOL 350 MG/ML SOLN
100.0000 mL | Freq: Once | INTRAVENOUS | Status: AC | PRN
  Administered 2022-03-10: 100 mL via INTRAVENOUS

## 2022-03-10 MED ORDER — NITROGLYCERIN 0.4 MG SL SUBL
0.8000 mg | SUBLINGUAL_TABLET | Freq: Once | SUBLINGUAL | Status: AC
  Administered 2022-03-10: 0.8 mg via SUBLINGUAL

## 2022-03-31 ENCOUNTER — Telehealth: Payer: Self-pay

## 2022-03-31 NOTE — Telephone Encounter (Signed)
PA renewal initiated automatically by CoverMyMeds.  Received a Prior Authorization request to for Saxenda 18MG /3ML pen-injectors via CoverMyMeds. Did not submit. Per charts, medication is discontinued per Patient Preference.   Key: 

## 2022-04-12 ENCOUNTER — Encounter: Payer: Self-pay | Admitting: Family

## 2022-04-12 ENCOUNTER — Telehealth: Payer: BC Managed Care – PPO | Admitting: Family

## 2022-04-12 DIAGNOSIS — J209 Acute bronchitis, unspecified: Secondary | ICD-10-CM | POA: Diagnosis not present

## 2022-04-12 MED ORDER — PREDNISONE 10 MG (21) PO TBPK: ORAL_TABLET | ORAL | 0 refills | Status: DC

## 2022-04-12 MED ORDER — BENZONATATE 200 MG PO CAPS: 200.0000 mg | ORAL_CAPSULE | Freq: Three times a day (TID) | ORAL | 1 refills | Status: DC | PRN

## 2022-04-12 NOTE — Patient Instructions (Signed)

## 2022-04-12 NOTE — Progress Notes (Signed)
Virtual Visit Consent   Kelly Jennings, you are scheduled for a virtual visit with a [facility] provider today. Just as with appointments in the office, your consent must be obtained to participate. Your consent will be active for this visit and any virtual visit you may have with one of our providers in the next 365 days. If you have a MyChart account, a copy of this consent can be sent to you electronically.  As this is a virtual visit, video technology does not allow for your provider to perform a traditional examination. This may limit your provider's ability to fully assess your condition. If your provider identifies any concerns that need to be evaluated in person or the need to arrange testing (such as labs, EKG, etc.), we will make arrangements to do so. Although advances in technology are sophisticated, we cannot ensure that it will always work on either your end or our end. If the connection with a video visit is poor, the visit may have to be switched to a telephone visit. With either a video or telephone visit, we are not always able to ensure that we have a secure connection.  By engaging in this virtual visit, you consent to the provision of healthcare and authorize for your insurance to be billed (if applicable) for the services provided during this visit. Depending on your insurance coverage, you may receive a charge related to this service.  I need to obtain your verbal consent now. Are you willing to proceed with your visit today? Kelly Jennings has provided verbal consent on 04/12/2022 for a virtual visit (video or telephone). Jannifer Rodney, FNP  Date: 04/12/2022 12:25 PM  Virtual Visit via Video Note   I, Jannifer Rodney, connected with  Kelly Jennings  ([id number], 05/20/1980) on 04/12/22 at 11:55 AM EST by a video-enabled telemedicine application and verified that I am speaking with the correct person using two identifiers.  Location: Patient: Virtual Visit Location Patient:  Home Provider: Virtual Visit Location Provider: Other: work   I discussed the limitations of evaluation and management by telemedicine and the availability of in person appointments. The patient expressed understanding and agreed to proceed.    History of Present Illness: Kelly Jennings is a 41 y.o. who identifies as a female who was assigned female at birth, and is being seen today for cough that started last week. Has done a home COVID test that was negative.   HPI: Cough This is a new problem. The current episode started 1 to 4 weeks ago. The problem has been waxing and waning. The problem occurs every few minutes. The cough is Productive of sputum. Associated symptoms include myalgias, nasal congestion, shortness of breath and wheezing. Pertinent negatives include no chills, ear congestion, ear pain, fever or headaches. She has tried rest and OTC cough suppressant for the symptoms. The treatment provided mild relief.    Problems:  Patient Active Problem List   Diagnosis Date Noted   Restless leg syndrome 11/16/2020   Anxiety 05/19/2020   Abdominal obesity and metabolic syndrome 05/19/2020   Prediabetes 01/29/2020   Depression, recurrent (HCC) 01/23/2020   Class 2 obesity due to excess calories without serious comorbidity with body mass index (BMI) of 39.0 to 39.9 in adult 02/19/2019   Essential hypertension, benign 02/19/2019   Vitamin D deficiency 02/19/2019   Vasodepressor syncope 06/12/2013   HTN (hypertension) 09/24/2012   GERD (gastroesophageal reflux disease) 09/24/2012    Allergies:  Allergies  Allergen Reactions  Penicillins Rash    Rash as an infant   Medications:  Current Outpatient Medications:    benzonatate (TESSALON) 200 MG capsule, Take 1 capsule (200 mg total) by mouth 3 (three) times daily as needed., Disp: 30 capsule, Rfl: 1   predniSONE (STERAPRED UNI-PAK 21 TAB) 10 MG (21) TBPK tablet, Use as directed, Disp: 21 tablet, Rfl: 0   escitalopram (LEXAPRO) 5 MG  tablet, Take 1 tablet (5 mg total) by mouth daily., Disp: 90 tablet, Rfl: 1   furosemide (LASIX) 20 MG tablet, Take 1-2 tablets (20-40 mg total) by mouth daily., Disp: 180 tablet, Rfl: 1   lisinopril-hydrochlorothiazide (ZESTORETIC) 20-25 MG tablet, TAKE 1 TABLET BY MOUTH DAILY. (NEEDS TO BE SEEN BEFORE NEXT REFILL), Disp: 30 tablet, Rfl: 0   metoprolol tartrate (LOPRESSOR) 100 MG tablet, Take 1 tablet 2 hours before CT, Disp: 1 tablet, Rfl: 0   pantoprazole (PROTONIX) 20 MG tablet, TAKE 1 TABLET (20 MG TOTAL) BY MOUTH DAILY. (NEEDS TO BE SEEN BEFORE NEXT REFILL), Disp: 30 tablet, Rfl: 0  Observations/Objective: Patient is well-developed, well-nourished in no acute distress.  Resting comfortably  at home.  Head is normocephalic, atraumatic.  No labored breathing.  Speech is clear and coherent with logical content.  Patient is alert and oriented at baseline.  Dry nonproductive cough  Assessment and Plan: 1. Acute bronchitis, unspecified organism - predniSONE (STERAPRED UNI-PAK 21 TAB) 10 MG (21) TBPK tablet; Use as directed  Dispense: 21 tablet; Refill: 0 - benzonatate (TESSALON) 200 MG capsule; Take 1 capsule (200 mg total) by mouth 3 (three) times daily as needed.  Dispense: 30 capsule; Refill: 1  - Take meds as prescribed - Use a cool mist humidifier  -Use saline nose sprays frequently -Force fluids -For any cough or congestion  Use plain Mucinex- regular strength or max strength is fine -For fever or aces or pains- take tylenol or ibuprofen. -Throat lozenges if help -Follow up if symptoms worsen or do not improve   Follow Up Instructions: I discussed the assessment and treatment plan with the patient. The patient was provided an opportunity to ask questions and all were answered. The patient agreed with the plan and demonstrated an understanding of the instructions.  A copy of instructions were sent to the patient via MyChart unless otherwise noted below.     The patient was  advised to call back or seek an in-person evaluation if the symptoms worsen or if the condition fails to improve as anticipated.  Time:  I spent 8 minutes with the patient via telehealth technology discussing the above problems/concerns.    Jannifer Rodney, FNP

## 2022-04-13 ENCOUNTER — Telehealth: Payer: BC Managed Care – PPO

## 2022-05-10 ENCOUNTER — Encounter: Payer: Self-pay | Admitting: *Deleted

## 2022-06-03 ENCOUNTER — Ambulatory Visit: Payer: BC Managed Care – PPO | Admitting: Nurse Practitioner

## 2022-06-03 ENCOUNTER — Encounter: Payer: Self-pay | Admitting: Family Medicine

## 2022-06-03 ENCOUNTER — Ambulatory Visit (INDEPENDENT_AMBULATORY_CARE_PROVIDER_SITE_OTHER): Payer: BC Managed Care – PPO | Admitting: Family Medicine

## 2022-06-03 VITALS — BP 122/86 | HR 95 | Temp 97.5°F | Ht 62.0 in | Wt 233.6 lb

## 2022-06-03 DIAGNOSIS — E559 Vitamin D deficiency, unspecified: Secondary | ICD-10-CM

## 2022-06-03 DIAGNOSIS — F339 Major depressive disorder, recurrent, unspecified: Secondary | ICD-10-CM | POA: Diagnosis not present

## 2022-06-03 DIAGNOSIS — K2100 Gastro-esophageal reflux disease with esophagitis, without bleeding: Secondary | ICD-10-CM

## 2022-06-03 DIAGNOSIS — F419 Anxiety disorder, unspecified: Secondary | ICD-10-CM

## 2022-06-03 DIAGNOSIS — R7303 Prediabetes: Secondary | ICD-10-CM

## 2022-06-03 DIAGNOSIS — Z7689 Persons encountering health services in other specified circumstances: Secondary | ICD-10-CM

## 2022-06-03 DIAGNOSIS — Z800 Family history of malignant neoplasm of digestive organs: Secondary | ICD-10-CM

## 2022-06-03 DIAGNOSIS — R5381 Other malaise: Secondary | ICD-10-CM

## 2022-06-03 DIAGNOSIS — I10 Essential (primary) hypertension: Secondary | ICD-10-CM | POA: Diagnosis not present

## 2022-06-03 LAB — LIPID PANEL

## 2022-06-03 LAB — BAYER DCA HB A1C WAIVED: HB A1C (BAYER DCA - WAIVED): 5.9 % — ABNORMAL HIGH (ref 4.8–5.6)

## 2022-06-03 MED ORDER — PANTOPRAZOLE SODIUM 20 MG PO TBEC: 20.0000 mg | DELAYED_RELEASE_TABLET | Freq: Every day | ORAL | 1 refills | Status: DC

## 2022-06-03 MED ORDER — CONTRAVE 8-90 MG PO TB12: ORAL_TABLET | ORAL | 0 refills | Status: DC

## 2022-06-03 MED ORDER — LISINOPRIL 40 MG PO TABS: 40.0000 mg | ORAL_TABLET | Freq: Every day | ORAL | 3 refills | Status: DC

## 2022-06-03 MED ORDER — ESCITALOPRAM OXALATE 5 MG PO TABS: 5.0000 mg | ORAL_TABLET | Freq: Every day | ORAL | 1 refills | Status: DC

## 2022-06-03 NOTE — Progress Notes (Signed)
Subjective:  Patient ID: Kelly Jennings, female    DOB: 09/25/1980, 42 y.o.   MRN: [id number]  Patient Care Team: Baruch Gouty, FNP as PCP - General (Family Medicine) Janina Mayo, MD as PCP - Cardiology (Cardiology)   Chief Complaint:  Establish Care ([facility] patient ), Medical Management of Chronic Issues (6 month follow up ), and Weight Loss (Dieting not helping )   HPI: Kelly Jennings is a 42 y.o. female presenting on 06/03/2022 for Establish Care ([facility] patient ), Medical Management of Chronic Issues (6 month follow up ), and Weight Loss (Dieting not helping )   Pt presents today to establish care with new PCP and for management of chronic medical conditions.  She has GERD and has been on PPI therapy for years, states when she tries to stop taking the medications she wakes up nightly with water brash. She has not been evaluated by GI. No hemoptysis, melena, or hematochezia. No voice change, cough, or dysphagia. She has Vit D deficiency and in on OTC repletion therapy. No arthralgias, difficulty walking, or recent fractures. Her hypertension is well controlled with lisinopril and lopressor. She denies chest pain, headaches, weakness, confusion, or syncope. Her biggest concern today is weight. States she has tried dieting but does not have the energy to exercise. Unable to drop weight. Feels her appetite is her biggest problem as she feels she can not suppress it. She reports ongoing malaise and fatigue for months.  She has a history of GAD and depression for which she is on Lexapro and tolerating well. Denies any SI or HI.     06/03/2022    9:05 AM 11/30/2021    9:39 AM 08/17/2020    8:12 AM 05/19/2020    9:41 AM 01/23/2020    1:41 PM  Depression screen PHQ 2/9  Decreased Interest 0 2 0 3   Down, Depressed, Hopeless 1 1 0 0   PHQ - 2 Score 1 3 0 3   Altered sleeping 3 1 1 3   $ Tired, decreased energy 3 3 1 3   $ Change in appetite 0 3 0 3   Feeling bad or failure about yourself  0 0 0 3    Trouble concentrating 0 3 1 3   $ Moving slowly or fidgety/restless 0 1 0 0   Suicidal thoughts 0 0 0 0   PHQ-9 Score 7 14 3 18   $ Difficult doing work/chores Somewhat difficult Somewhat difficult Not difficult at all Very difficult Somewhat difficult      06/03/2022    9:06 AM 11/30/2021    9:39 AM 08/17/2020    8:13 AM 05/19/2020    9:46 AM  GAD 7 : Generalized Anxiety Score  Nervous, Anxious, on Edge 1 3 1 2  $ Control/stop worrying 1 3 1 3  $ Worry too much - different things 1 3 1 3  $ Trouble relaxing 1 1 1 3  $ Restless 1 1 1 3  $ Easily annoyed or irritable 1 3 1 3  $ Afraid - awful might happen 1 3 0 3  Total GAD 7 Score 7 17 6 20  $ Anxiety Difficulty Somewhat difficult  Not difficult at all Somewhat difficult     Relevant past medical, surgical, family, and social history reviewed and updated as indicated.  Allergies and medications reviewed and updated. Data reviewed: Chart in Epic.   Past Medical History:  Diagnosis Date   GERD (gastroesophageal reflux disease)    Hypertension  Migraines     Past Surgical History:  Procedure Laterality Date   CHOLECYSTECTOMY      Social History   Socioeconomic History   Marital status: Married    Spouse name: Not on file   Number of children: Not on file   Years of education: Not on file   Highest education level: Not on file  Occupational History   Not on file  Tobacco Use   Smoking status: Never   Smokeless tobacco: Never  Vaping Use   Vaping Use: Never used  Substance and Sexual Activity   Alcohol use: No   Drug use: No   Sexual activity: Not on file  Other Topics Concern   Not on file  Social History Narrative   Not on file   Social Determinants of Health   Financial Resource Strain: Not on file  Food Insecurity: Not on file  Transportation Needs: Not on file  Physical Activity: Not on file  Stress: Not on file  Social Connections: Not on file  Intimate Partner Violence: Not on file    Outpatient Encounter  Medications as of 06/03/2022  Medication Sig   furosemide (LASIX) 20 MG tablet Take 1-2 tablets (20-40 mg total) by mouth daily.   lisinopril (ZESTRIL) 40 MG tablet Take 1 tablet (40 mg total) by mouth daily.   metoprolol tartrate (LOPRESSOR) 100 MG tablet Take 1 tablet 2 hours before CT   Naltrexone-buPROPion HCl ER (CONTRAVE) 8-90 MG TB12 1 tablet Po Qam x7d ; then 1 tab po BID x 7 d; then 2 tab in AM and 1 in PM X7d; Then 2 po BID from then on   [DISCONTINUED] escitalopram (LEXAPRO) 5 MG tablet Take 1 tablet (5 mg total) by mouth daily.   [DISCONTINUED] lisinopril-hydrochlorothiazide (ZESTORETIC) 20-25 MG tablet TAKE 1 TABLET BY MOUTH DAILY. (NEEDS TO BE SEEN BEFORE NEXT REFILL)   [DISCONTINUED] pantoprazole (PROTONIX) 20 MG tablet TAKE 1 TABLET (20 MG TOTAL) BY MOUTH DAILY. (NEEDS TO BE SEEN BEFORE NEXT REFILL)   escitalopram (LEXAPRO) 5 MG tablet Take 1 tablet (5 mg total) by mouth daily.   pantoprazole (PROTONIX) 20 MG tablet Take 1 tablet (20 mg total) by mouth daily.   [DISCONTINUED] benzonatate (TESSALON) 200 MG capsule Take 1 capsule (200 mg total) by mouth 3 (three) times daily as needed.   [DISCONTINUED] predniSONE (STERAPRED UNI-PAK 21 TAB) 10 MG (21) TBPK tablet Use as directed   No facility-administered encounter medications on file as of 06/03/2022.    Allergies  Allergen Reactions   Penicillins Rash    Rash as an infant    Review of Systems  Constitutional:  Positive for activity change, appetite change and fatigue. Negative for chills, diaphoresis, fever and unexpected weight change.  HENT: Negative.    Eyes: Negative.  Negative for photophobia and visual disturbance.  Respiratory:  Negative for cough, chest tightness and shortness of breath.   Cardiovascular:  Negative for chest pain, palpitations and leg swelling.  Gastrointestinal:  Negative for abdominal pain, blood in stool, constipation, diarrhea, nausea and vomiting.  Endocrine: Negative.   Genitourinary:  Negative  for decreased urine volume, difficulty urinating, dysuria, frequency and urgency.  Musculoskeletal:  Negative for arthralgias and myalgias.  Skin: Negative.   Allergic/Immunologic: Negative.   Neurological:  Negative for dizziness and headaches.  Hematological: Negative.   Psychiatric/Behavioral:  Positive for agitation, decreased concentration and sleep disturbance. Negative for behavioral problems, confusion, dysphoric mood, hallucinations, self-injury and suicidal ideas. The patient is nervous/anxious. The patient  is not hyperactive.   All other systems reviewed and are negative.       Objective:  BP 122/86   Pulse 95   Temp (!) 97.5 F (36.4 C) (Temporal)   Ht 5' 2"$  (1.575 m)   Wt 233 lb 9.6 oz (106 kg)   SpO2 97%   BMI 42.73 kg/m    Wt Readings from Last 3 Encounters:  06/03/22 233 lb 9.6 oz (106 kg)  02/23/22 234 lb 9.6 oz (106.4 kg)  11/30/21 230 lb (104.3 kg)    Physical Exam Vitals and nursing note reviewed.  Constitutional:      General: She is not in acute distress.    Appearance: Normal appearance. She is well-developed and well-groomed. She is morbidly obese. She is not ill-appearing, toxic-appearing or diaphoretic.  HENT:     Head: Normocephalic and atraumatic.     Jaw: There is normal jaw occlusion.     Right Ear: Hearing, tympanic membrane, ear canal and external ear normal.     Left Ear: Hearing, tympanic membrane, ear canal and external ear normal.     Nose: Nose normal.     Mouth/Throat:     Lips: Pink.     Mouth: Mucous membranes are moist.     Pharynx: Oropharynx is clear. Uvula midline.  Eyes:     General: Lids are normal.     Extraocular Movements: Extraocular movements intact.     Conjunctiva/sclera: Conjunctivae normal.     Pupils: Pupils are equal, round, and reactive to light.  Neck:     Thyroid: No thyroid mass, thyromegaly or thyroid tenderness.     Vascular: No carotid bruit or JVD.     Trachea: Trachea and phonation normal.   Cardiovascular:     Rate and Rhythm: Normal rate and regular rhythm.     Chest Wall: PMI is not displaced.     Pulses: Normal pulses.     Heart sounds: Normal heart sounds. No murmur heard.    No friction rub. No gallop.  Pulmonary:     Effort: Pulmonary effort is normal. No respiratory distress.     Breath sounds: Normal breath sounds. No wheezing.  Abdominal:     General: Bowel sounds are normal. There is no distension or abdominal bruit.     Palpations: Abdomen is soft. There is no hepatomegaly or splenomegaly.     Tenderness: There is no abdominal tenderness. There is no right CVA tenderness or left CVA tenderness.     Hernia: No hernia is present.  Musculoskeletal:        General: Normal range of motion.     Cervical back: Normal range of motion and neck supple.     Right lower leg: No edema.     Left lower leg: No edema.  Lymphadenopathy:     Cervical: No cervical adenopathy.  Skin:    General: Skin is warm and dry.     Capillary Refill: Capillary refill takes less than 2 seconds.     Coloration: Skin is not cyanotic, jaundiced or pale.     Findings: No rash.  Neurological:     General: No focal deficit present.     Mental Status: She is alert and oriented to person, place, and time.     Sensory: Sensation is intact.     Motor: Motor function is intact.     Coordination: Coordination is intact.     Gait: Gait is intact.     Deep Tendon Reflexes: Reflexes  are normal and symmetric.  Psychiatric:        Attention and Perception: Attention and perception normal.        Mood and Affect: Mood and affect normal.        Speech: Speech normal.        Behavior: Behavior normal. Behavior is cooperative.        Thought Content: Thought content normal.        Cognition and Memory: Cognition and memory normal.        Judgment: Judgment normal.     Results for orders placed or performed in visit on [id number]  Basic metabolic panel  Result Value Ref Range   Glucose 88 70 - 99  mg/dL   BUN 14 6 - 24 mg/dL   Creatinine, Ser 0.88 0.57 - 1.00 mg/dL   eGFR 84 >59 mL/min/1.73   BUN/Creatinine Ratio 16 9 - 23   Sodium 138 134 - 144 mmol/L   Potassium 4.4 3.5 - 5.2 mmol/L   Chloride 101 96 - 106 mmol/L   CO2 21 20 - 29 mmol/L   Calcium 9.2 8.7 - 10.2 mg/dL       Pertinent labs & imaging results that were available during my care of the patient were reviewed by me and considered in my medical decision making.  Assessment & Plan:  Roye was seen today for establish care, medical management of chronic issues and weight loss.  Diagnoses and all orders for this visit:  Essential hypertension, benign BP well controlled. Changes were not made in regimen today. Goal BP is 130/80. Pt aware to report any persistent high or low readings. DASH diet and exercise encouraged. Exercise at least 150 minutes per week and increase as tolerated. Goal BMI > 25. Stress management encouraged. Avoid nicotine and tobacco product use. Avoid excessive alcohol and NSAID's. Avoid more than 2000 mg of sodium daily. Medications as prescribed. Follow up as scheduled.  -     lisinopril (ZESTRIL) 40 MG tablet; Take 1 tablet (40 mg total) by mouth daily. -     CBC with Differential/Platelet -     CMP14+EGFR -     Lipid panel -     Thyroid Panel With TSH  Anxiety Depression, recurrent ([place]) Doing well on current regimen, will continue.  -     escitalopram (LEXAPRO) 5 MG tablet; Take 1 tablet (5 mg total) by mouth daily. -     Thyroid Panel With TSH  Gastroesophageal reflux disease with esophagitis without hemorrhage Has been unable to taper off of PPI therapy. Has not seen GI for EGD, will place referral today.  -     pantoprazole (PROTONIX) 20 MG tablet; Take 1 tablet (20 mg total) by mouth daily. -     CBC with Differential/Platelet  Vitamin D deficiency Labs pending. Continue repletion therapy. If indicated, will change repletion dosage. Eat foods rich in Vit D including milk, orange  juice, yogurt with vitamin D added, salmon or mackerel, canned tuna fish, cereals with vitamin D added, and cod liver oil. Get out in the sun but make sure to wear at least SPF 30 sunscreen.  -     VITAMIN D 25 Hydroxy (Vit-D Deficiency, Fractures)  Prediabetes Diet and exercise encouraged. Labs pending.  -     CMP14+EGFR -     Bayer DCA Hb A1c Waived  Morbid obesity ([place]) Encounter for weight management Discussed the importance of diet and exercise. Pt to keep a log of both and  bring to next visit. Labs pending. Will trial contrave to help with weight reduction and appetite suppression.  -     CBC with Differential/Platelet -     CMP14+EGFR -     Lipid panel -     Thyroid Panel With TSH -     Vitamin B12 -     VITAMIN D 25 Hydroxy (Vit-D Deficiency, Fractures) -     Bayer DCA Hb A1c Waived -     Naltrexone-buPROPion HCl ER (CONTRAVE) 8-90 MG TB12; 1 tablet Po Qam x7d ; then 1 tab po BID x 7 d; then 2 tab in AM and 1 in PM X7d; Then 2 po BID from then on  Malaise and fatigue Will check below for potential underlying causes.  -     CBC with Differential/Platelet -     CMP14+EGFR -     Thyroid Panel With TSH -     Vitamin B12 -     VITAMIN D 25 Hydroxy (Vit-D Deficiency, Fractures)  Family history of colon cancer  Will likely need EGD and colonoscopy. Referral to GI placed.  -     Ambulatory referral to Gastroenterology     Continue all other maintenance medications.  Follow up plan: Return in about 8 weeks (around 07/29/2022), or if symptoms worsen or fail to improve, for BMI.   Continue healthy lifestyle choices, including diet (rich in fruits, vegetables, and lean proteins, and low in salt and simple carbohydrates) and exercise (at least 30 minutes of moderate physical activity daily).  Educational handout given for calorie counting for weight loss  The above assessment and management plan was discussed with the patient. The patient verbalized understanding of and has  agreed to the management plan. Patient is aware to call the clinic if they develop any new symptoms or if symptoms persist or worsen. Patient is aware when to return to the clinic for a follow-up visit. Patient educated on when it is appropriate to go to the emergency department.   Monia Pouch, FNP-C [facility] [phone]

## 2022-06-04 LAB — CMP14+EGFR
ALT: 36 IU/L — ABNORMAL HIGH (ref 0–32)
AST: 22 IU/L (ref 0–40)
Albumin/Globulin Ratio: 1.8 (ref 1.2–2.2)
Albumin: 4.4 g/dL (ref 3.9–4.9)
Alkaline Phosphatase: 80 IU/L (ref 44–121)
BUN/Creatinine Ratio: 17 (ref 9–23)
BUN: 14 mg/dL (ref 6–24)
Bilirubin Total: 0.4 mg/dL (ref 0.0–1.2)
CO2: 21 mmol/L (ref 20–29)
Calcium: 9.4 mg/dL (ref 8.7–10.2)
Chloride: 102 mmol/L (ref 96–106)
Creatinine, Ser: 0.83 mg/dL (ref 0.57–1.00)
Globulin, Total: 2.5 g/dL (ref 1.5–4.5)
Glucose: 102 mg/dL — ABNORMAL HIGH (ref 70–99)
Potassium: 4.7 mmol/L (ref 3.5–5.2)
Sodium: 140 mmol/L (ref 134–144)
Total Protein: 6.9 g/dL (ref 6.0–8.5)
eGFR: 90 mL/min/{1.73_m2} (ref >59)

## 2022-06-04 LAB — THYROID PANEL WITH TSH
Free Thyroxine Index: 2.3 (ref 1.2–4.9)
T3 Uptake Ratio: 26 % (ref 24–39)
T4, Total: 8.7 ug/dL (ref 4.5–12.0)
TSH: 0.992 u[IU]/mL (ref 0.450–4.500)

## 2022-06-04 LAB — CBC WITH DIFFERENTIAL/PLATELET
Basophils Absolute: 0.1 10*3/uL (ref 0.0–0.2)
Basos: 1 %
EOS (ABSOLUTE): 0.2 10*3/uL (ref 0.0–0.4)
Eos: 3 %
Hematocrit: 40.6 % (ref 34.0–46.6)
Hemoglobin: 13.2 g/dL (ref 11.1–15.9)
Immature Grans (Abs): 0 10*3/uL (ref 0.0–0.1)
Immature Granulocytes: 0 %
Lymphocytes Absolute: 2.2 10*3/uL (ref 0.7–3.1)
Lymphs: 27 %
MCH: 30.4 pg (ref 26.6–33.0)
MCHC: 32.5 g/dL (ref 31.5–35.7)
MCV: 94 fL (ref 79–97)
Monocytes Absolute: 0.6 10*3/uL (ref 0.1–0.9)
Monocytes: 8 %
Neutrophils Absolute: 4.9 10*3/uL (ref 1.4–7.0)
Neutrophils: 61 %
Platelets: 273 10*3/uL (ref 150–450)
RBC: 4.34 x10E6/uL (ref 3.77–5.28)
RDW: 13.2 % (ref 11.7–15.4)
WBC: 8 10*3/uL (ref 3.4–10.8)

## 2022-06-04 LAB — VITAMIN D 25 HYDROXY (VIT D DEFICIENCY, FRACTURES): Vit D, 25-Hydroxy: 43.5 ng/mL (ref 30.0–100.0)

## 2022-06-04 LAB — LIPID PANEL
Chol/HDL Ratio: 4.5 ratio — ABNORMAL HIGH (ref 0.0–4.4)
Cholesterol, Total: 214 mg/dL — ABNORMAL HIGH (ref 100–199)
HDL: 48 mg/dL (ref >39)
LDL Chol Calc (NIH): 142 mg/dL — ABNORMAL HIGH (ref 0–99)
Triglycerides: 135 mg/dL (ref 0–149)
VLDL Cholesterol Cal: 24 mg/dL (ref 5–40)

## 2022-06-04 LAB — VITAMIN B12: Vitamin B-12: 411 pg/mL (ref 232–1245)

## 2022-08-05 ENCOUNTER — Encounter: Payer: Self-pay | Admitting: Family Medicine

## 2022-08-08 IMAGING — CT CT HEAD W/O CM
4 series · 16 of 47 positions shown, 18 images · non-contrast
Comparison: Report of [HOSPITAL] [facility] 02/05/2010
(no images available).

CLINICAL DATA: 40-year-old female status post syncope, fall in
bathroom.



[Series 3: head without · axial · non-contrast · 0.42mm/px · z∈[+204,+324]mm · 7 of 33 slices shown, 9 images]
[im 5/33  brain]
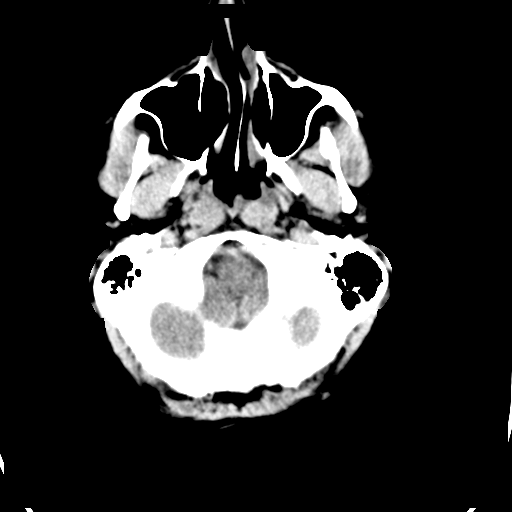
[im 5/33  bone]
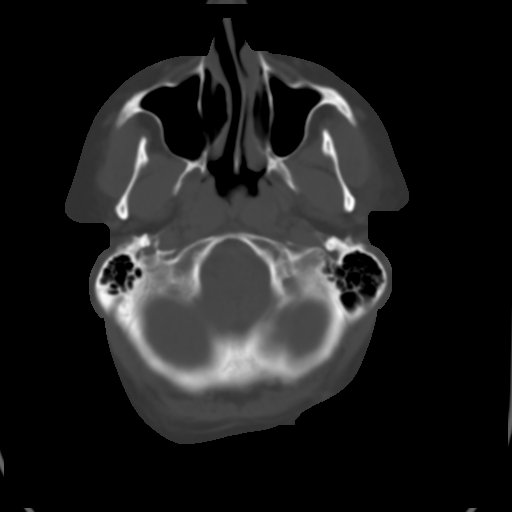
[im 9/33  brain]
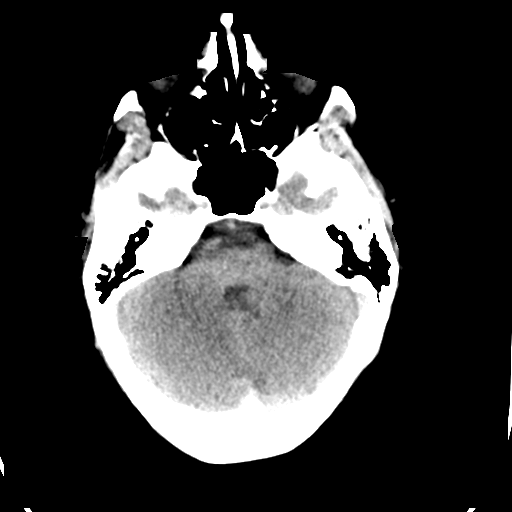
[im 13/33  brain]
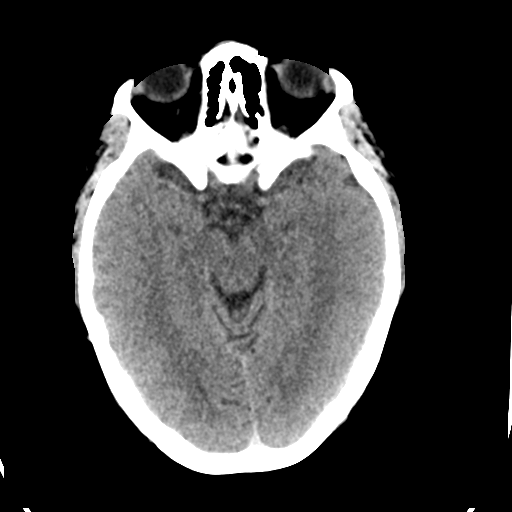
[im 17/33  brain]
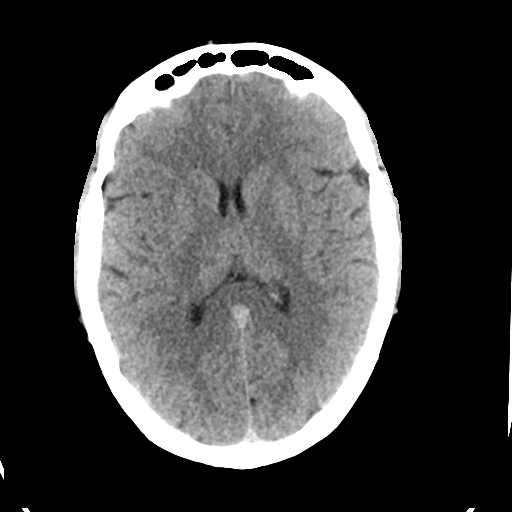
[im 21/33  brain]
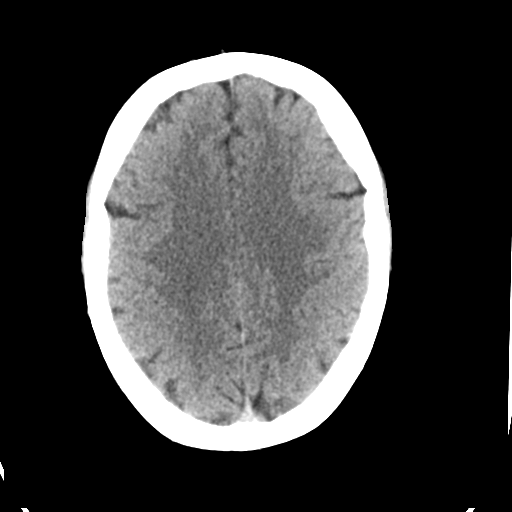
[im 21/33  bone]
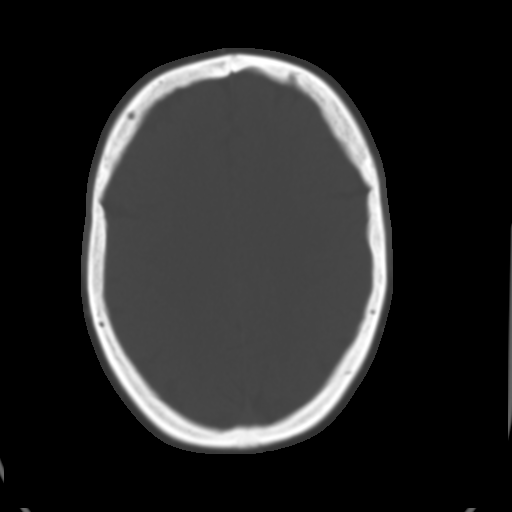
[im 25/33  brain]
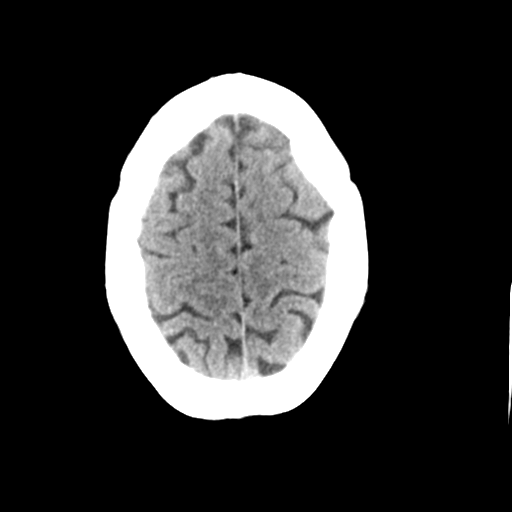
[im 29/33  brain]
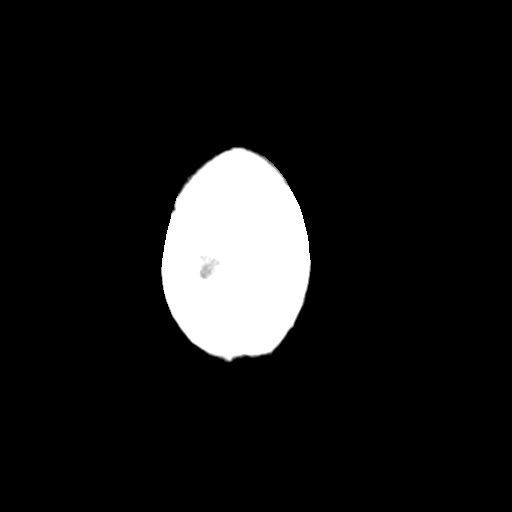

[Series 4: head bone · axial · 0.42mm/px · z∈[+200,+232]mm · 3 of 83 slices shown]
[im 9/83  bone]
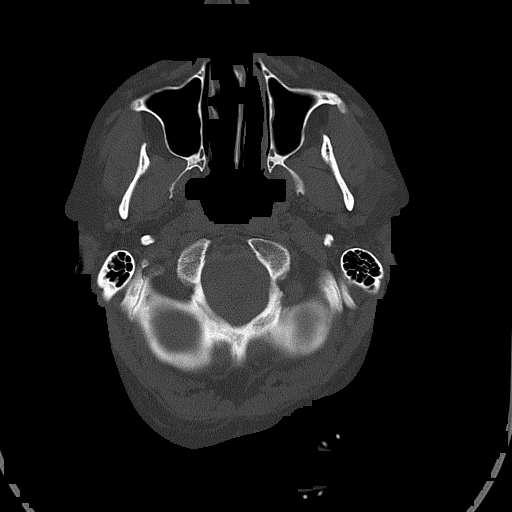
[im 17/83  bone]
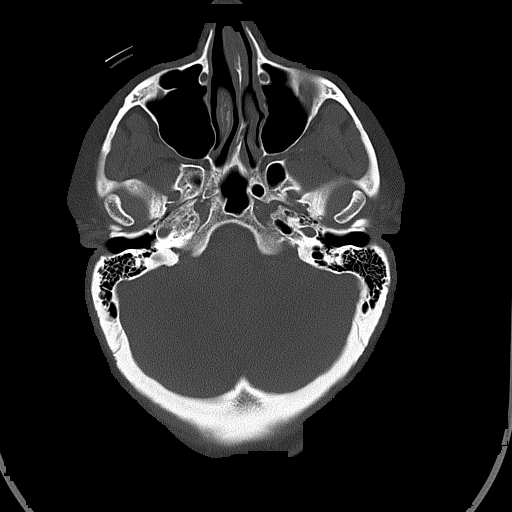
[im 25/83  bone]
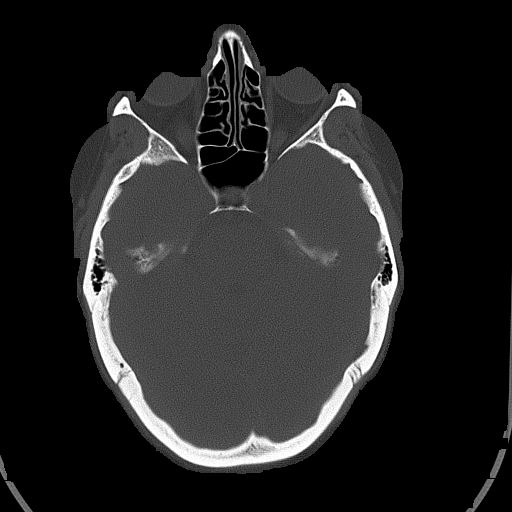

[Series 5: head without cor · coronal · non-contrast · 0.32mm/px · 3 of 67 slices shown]
[im 23/67  brain]
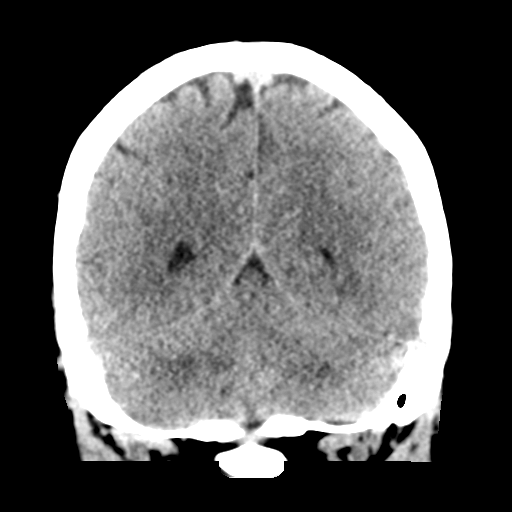
[im 30/67  brain]
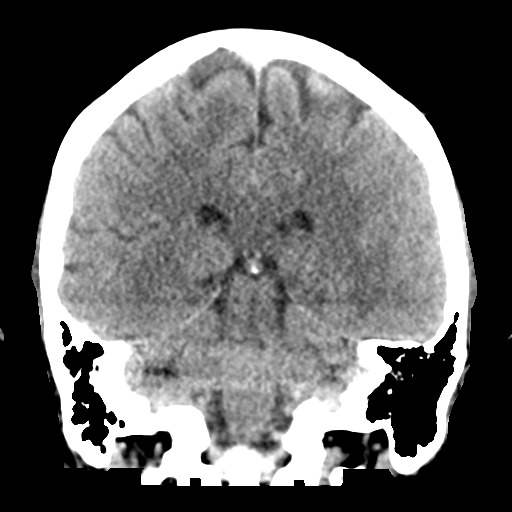
[im 37/67  brain]
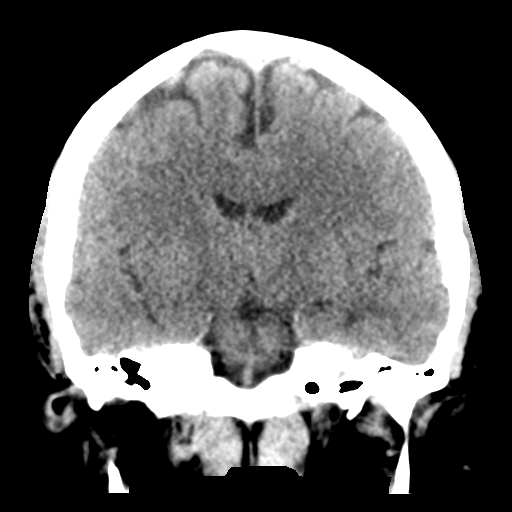

[Series 6: head without sag · sagittal · non-contrast · 0.32mm/px · 3 of 55 slices shown]
[im 19/55  brain]
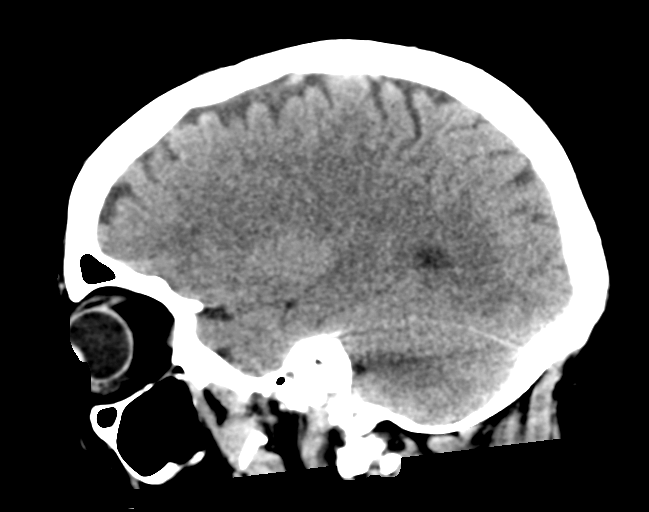
[im 28/55  brain]
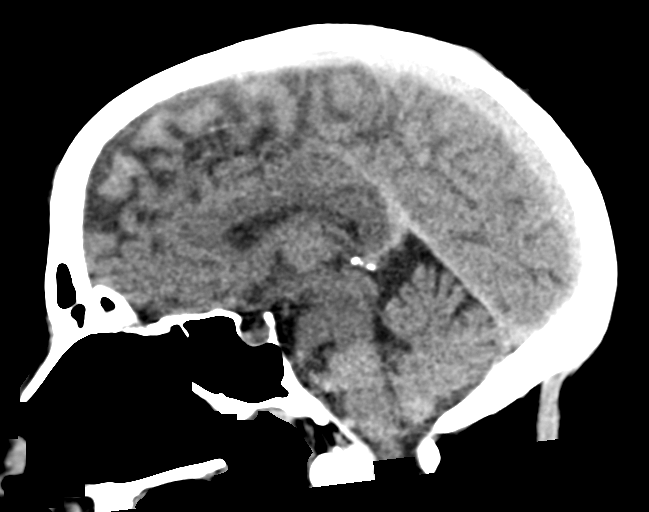
[im 37/55  brain]
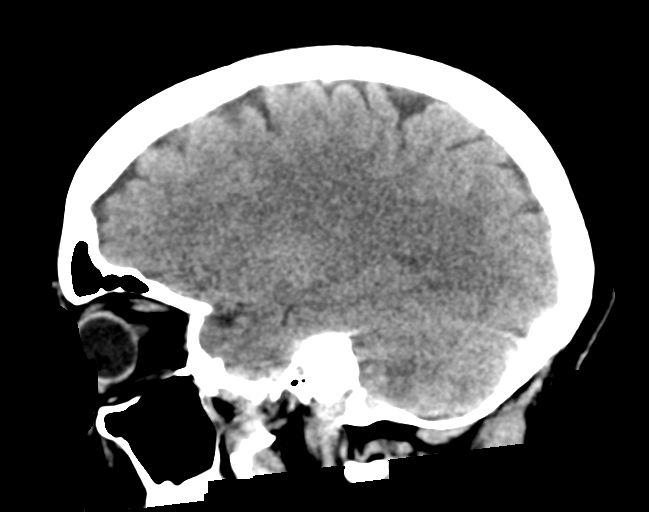

[16 of 47 positions shown; findings below may reference images not displayed]

FINDINGS: Brain: Normal cerebral volume. No midline shift, ventriculomegaly,
mass effect, evidence of mass lesion, intracranial hemorrhage or
evidence of cortically based acute infarction. Gray-white matter
differentiation is within normal limits throughout the brain.

Vascular: No suspicious intracranial vascular hyperdensity.

Skull: No fracture identified.

Sinuses/Orbits: Paranasal sinuses are well aerated, trace mucosal
thickening in the left maxillary sinus. Tympanic cavities and
mastoids are clear.

Other: No orbit or scalp soft tissue injury identified.
IMPRESSION: No acute traumatic injury identified. Normal noncontrast CT
appearance of the brain.

## 2022-08-11 ENCOUNTER — Encounter: Payer: Self-pay | Admitting: Internal Medicine

## 2022-08-15 ENCOUNTER — Emergency Department (HOSPITAL_BASED_OUTPATIENT_CLINIC_OR_DEPARTMENT_OTHER)
Admission: EM | Admit: 2022-08-15 | Discharge: 2022-08-15 | Disposition: A | Discharge status: Home / Self Care | Payer: BC Managed Care – PPO | Attending: Emergency Medicine | Admitting: Emergency Medicine

## 2022-08-15 ENCOUNTER — Other Ambulatory Visit: Payer: Self-pay

## 2022-08-15 ENCOUNTER — Encounter (HOSPITAL_BASED_OUTPATIENT_CLINIC_OR_DEPARTMENT_OTHER): Payer: Self-pay

## 2022-08-15 ENCOUNTER — Emergency Department (HOSPITAL_BASED_OUTPATIENT_CLINIC_OR_DEPARTMENT_OTHER): Payer: BC Managed Care – PPO

## 2022-08-15 DIAGNOSIS — R112 Nausea with vomiting, unspecified: Secondary | ICD-10-CM | POA: Diagnosis not present

## 2022-08-15 DIAGNOSIS — Z79899 Other long term (current) drug therapy: Secondary | ICD-10-CM | POA: Insufficient documentation

## 2022-08-15 DIAGNOSIS — R1084 Generalized abdominal pain: Secondary | ICD-10-CM | POA: Diagnosis present

## 2022-08-15 LAB — CBC
HCT: 39.4 % (ref 36.0–46.0)
Hemoglobin: 12.9 g/dL (ref 12.0–15.0)
MCH: 30.4 pg (ref 26.0–34.0)
MCHC: 32.7 g/dL (ref 30.0–36.0)
MCV: 92.9 fL (ref 80.0–100.0)
Platelets: 247 10*3/uL (ref 150–400)
RBC: 4.24 MIL/uL (ref 3.87–5.11)
RDW: 13.2 % (ref 11.5–15.5)
WBC: 9.4 10*3/uL (ref 4.0–10.5)
nRBC: 0 % (ref 0.0–0.2)

## 2022-08-15 LAB — COMPREHENSIVE METABOLIC PANEL
ALT: 28 U/L (ref 0–44)
AST: 19 U/L (ref 15–41)
Albumin: 4.2 g/dL (ref 3.5–5.0)
Alkaline Phosphatase: 68 U/L (ref 38–126)
Anion gap: 10 (ref 5–15)
BUN: 9 mg/dL (ref 6–20)
CO2: 22 mmol/L (ref 22–32)
Calcium: 9.2 mg/dL (ref 8.9–10.3)
Chloride: 104 mmol/L (ref 98–111)
Creatinine, Ser: 0.79 mg/dL (ref 0.44–1.00)
GFR, Estimated: >60 mL/min (ref >60)
Glucose, Bld: 99 mg/dL (ref 70–99)
Potassium: 4.1 mmol/L (ref 3.5–5.1)
Sodium: 136 mmol/L (ref 135–145)
Total Bilirubin: 0.7 mg/dL (ref 0.3–1.2)
Total Protein: 7.1 g/dL (ref 6.5–8.1)

## 2022-08-15 LAB — URINALYSIS, ROUTINE W REFLEX MICROSCOPIC
Bilirubin Urine: NEGATIVE
Glucose, UA: NEGATIVE mg/dL
Hgb urine dipstick: NEGATIVE
Ketones, ur: NEGATIVE mg/dL
Leukocytes,Ua: NEGATIVE
Nitrite: NEGATIVE
Protein, ur: NEGATIVE mg/dL
Specific Gravity, Urine: 1.028 (ref 1.005–1.030)
pH: 5.5 (ref 5.0–8.0)

## 2022-08-15 LAB — PREGNANCY, URINE: Preg Test, Ur: NEGATIVE

## 2022-08-15 LAB — LIPASE, BLOOD: Lipase: 29 U/L (ref 11–51)

## 2022-08-15 MED ORDER — SODIUM CHLORIDE 0.9 % IV BOLUS
1000.0000 mL | Freq: Once | INTRAVENOUS | Status: AC
  Administered 2022-08-15: 1000 mL via INTRAVENOUS

## 2022-08-15 MED ORDER — DICYCLOMINE HCL 20 MG PO TABS: 20.0000 mg | ORAL_TABLET | Freq: Two times a day (BID) | ORAL | 0 refills | Status: DC

## 2022-08-15 MED ORDER — IOHEXOL 300 MG/ML  SOLN
100.0000 mL | Freq: Once | INTRAMUSCULAR | Status: AC | PRN
  Administered 2022-08-15: 80 mL via INTRAVENOUS

## 2022-08-15 MED ORDER — ONDANSETRON HCL 4 MG/2ML IJ SOLN
4.0000 mg | Freq: Once | INTRAMUSCULAR | Status: AC
  Administered 2022-08-15: 4 mg via INTRAVENOUS
  Filled 2022-08-15: qty 2

## 2022-08-15 MED ORDER — ONDANSETRON 4 MG PO TBDP: 4.0000 mg | ORAL_TABLET | Freq: Three times a day (TID) | ORAL | 0 refills | Status: DC | PRN

## 2022-08-15 NOTE — ED Provider Notes (Signed)
[place] EMERGENCY DEPARTMENT AT [facility] Provider Note   CSN: [id number] Arrival date & time: 08/15/22  3664     History  Chief Complaint  Patient presents with   Abdominal Pain    Kelly Jennings is a 42 y.o. female.  Patient here with nausea diarrhea abdominal pain.  Symptoms the last few days.  Started after getting home from [place].  History of hypertension, acid reflux, migraines.  Nothing makes it worse or better.  Denies any dark stools or bloody emesis.  Denies any fever or chills recently.  Denies any chest pain shortness of breath, weakness, numbness, tingling.  The history is provided by the patient.       Home Medications Prior to Admission medications   Medication Sig Start Date End Date Taking? Authorizing Provider  dicyclomine (BENTYL) 20 MG tablet Take 1 tablet (20 mg total) by mouth 2 (two) times daily for 10 doses. 08/15/22 08/20/22 Yes Shanicka Oldenkamp, DO  ondansetron (ZOFRAN-ODT) 4 MG disintegrating tablet Take 1 tablet (4 mg total) by mouth every 8 (eight) hours as needed for nausea or vomiting. 08/15/22  Yes Aivan Fillingim, DO  escitalopram (LEXAPRO) 5 MG tablet Take 1 tablet (5 mg total) by mouth daily. 06/03/22   Sonny Masters, FNP  furosemide (LASIX) 20 MG tablet Take 1-2 tablets (20-40 mg total) by mouth daily. 02/28/22   Daryll Drown, NP  lisinopril (ZESTRIL) 40 MG tablet Take 1 tablet (40 mg total) by mouth daily. 06/03/22   Sonny Masters, FNP  metoprolol tartrate (LOPRESSOR) 100 MG tablet Take 1 tablet 2 hours before CT 02/23/22   [facility], Roe Rutherford, PA  Naltrexone-buPROPion HCl ER (CONTRAVE) 8-90 MG TB12 1 tablet Po Qam x7d ; then 1 tab po BID x 7 d; then 2 tab in AM and 1 in PM X7d; Then 2 po BID from then on 06/03/22   Sonny Masters, FNP  pantoprazole (PROTONIX) 20 MG tablet Take 1 tablet (20 mg total) by mouth daily. 06/03/22   Sonny Masters, FNP      Allergies    Penicillins    Review of Systems   Review of Systems  Physical  Exam Updated Vital Signs BP 124/74   Pulse 71   Temp 98.4 F (36.9 C) (Oral)   Resp 16   Ht 5\' 2"  (1.575 m)   Wt 104.3 kg   LMP 08/03/2022   SpO2 97%   BMI 42.07 kg/m  Physical Exam Vitals and nursing note reviewed.  Constitutional:      General: She is not in acute distress.    Appearance: She is well-developed. She is not ill-appearing.  HENT:     Head: Normocephalic and atraumatic.     Mouth/Throat:     Mouth: Mucous membranes are moist.  Eyes:     Extraocular Movements: Extraocular movements intact.     Conjunctiva/sclera: Conjunctivae normal.     Pupils: Pupils are equal, round, and reactive to light.  Cardiovascular:     Rate and Rhythm: Normal rate and regular rhythm.     Heart sounds: Normal heart sounds. No murmur heard. Pulmonary:     Effort: Pulmonary effort is normal. No respiratory distress.     Breath sounds: Normal breath sounds.  Abdominal:     Palpations: Abdomen is soft.     Tenderness: There is no abdominal tenderness. There is no guarding.  Musculoskeletal:        General: No swelling.     Cervical  back: Neck supple.  Skin:    General: Skin is warm and dry.     Capillary Refill: Capillary refill takes less than 2 seconds.  Neurological:     Mental Status: She is alert.  Psychiatric:        Mood and Affect: Mood normal.     ED Results / Procedures / Treatments   Labs (all labs ordered are listed, but only abnormal results are displayed) Labs Reviewed  LIPASE, BLOOD  COMPREHENSIVE METABOLIC PANEL  CBC  URINALYSIS, ROUTINE W REFLEX MICROSCOPIC  PREGNANCY, URINE    EKG None  Radiology CT ABDOMEN PELVIS W CONTRAST  Result Date: 08/15/2022 CLINICAL DATA:  Diarrhea and stomach cramps.  Abdominal pain. EXAM: CT ABDOMEN AND PELVIS WITH CONTRAST TECHNIQUE: Multidetector CT imaging of the abdomen and pelvis was performed using the standard protocol following bolus administration of intravenous contrast. RADIATION DOSE REDUCTION: This exam was  performed according to the departmental dose-optimization program which includes automated exposure control, adjustment of the mA and/or kV according to patient size and/or use of iterative reconstruction technique. CONTRAST:  80mL OMNIPAQUE IOHEXOL 300 MG/ML  SOLN COMPARISON:  Abdomen CT 02/12/2010 FINDINGS: Lower chest: Unremarkable. Hepatobiliary: No suspicious focal abnormality within the liver parenchyma. Small area of low attenuation in the anterior liver, adjacent to the falciform ligament, is in a characteristic location for focal fatty deposition. Gallbladder is surgically absent. No intrahepatic or extrahepatic biliary dilation. Pancreas: No focal mass lesion. No dilatation of the main duct. No intraparenchymal cyst. No peripancreatic edema. Spleen: No splenomegaly. No focal mass lesion. Adrenals/Urinary Tract: No adrenal nodule or mass. Kidneys unremarkable. No evidence for hydroureter. Bladder is decompressed. Stomach/Bowel: Stomach is unremarkable. No gastric wall thickening. No evidence of outlet obstruction. Duodenum is normally positioned as is the ligament of Treitz. No small bowel wall thickening. No small bowel dilatation. The terminal ileum is normal. The appendix is normal. The colon is diffusely decompressed in free of stool. Decompressed state accentuates wall thickness but there does appear to be circumferential wall thickening in the ascending colon (image 40/series 2). No substantial pericolonic edema or inflammation. Vascular/Lymphatic: No abdominal aortic aneurysm. No abdominal aortic atherosclerotic calcification. There is no gastrohepatic or hepatoduodenal ligament lymphadenopathy. No retroperitoneal or mesenteric lymphadenopathy. No pelvic sidewall lymphadenopathy. Reproductive: The uterus is unremarkable.  There is no adnexal mass. Other: No intraperitoneal free fluid. Musculoskeletal: No worrisome lytic or sclerotic osseous abnormality. IMPRESSION: 1. Circumferential wall thickening  in the ascending colon without substantial pericolonic edema or inflammation. Imaging features are compatible with a nonspecific infectious/inflammatory colitis. 2. Otherwise, no acute findings in the abdomen or pelvis. Electronically Signed   By: Kennith Center M.D.   On: 08/15/2022 08:15    Procedures Procedures    Medications Ordered in ED Medications  sodium chloride 0.9 % bolus 1,000 mL (1,000 mLs Intravenous New Bag/Given 08/15/22 0720)  ondansetron (ZOFRAN) injection 4 mg (4 mg Intravenous Given 08/15/22 0722)  iohexol (OMNIPAQUE) 300 MG/ML solution 100 mL (80 mLs Intravenous Contrast Given 08/15/22 0740)    ED Course/ Medical Decision Making/ A&P                             Medical Decision Making Amount and/or Complexity of Data Reviewed Labs: ordered. Radiology: ordered.  Risk Prescription drug management.   TYASHIA MORRISETTE is here with abdominal pain, nausea, vomiting, diarrhea for the last few weeks.  No recent antibiotics.  Recently got back from the  [place].  Normal vitals.  No fever.  Some mild abdominal cramping during this time.  No real focal tenderness on exam.  Differential diagnosis likely colitis/enteritis/gastroenteritis, seems less likely bowel obstruction or other intra-abdominal emergent process.  However will get CBC, CMP, lipase, urinalysis, CT scan abdomen pelvis to further evaluate.  Will give IV fluids and IV Zofran and reevaluate.  Per my review and interpretation of labs is no significant anemia or electrolyte abnormality or kidney injury or leukocytosis.  CT scan per allergy report with some features of nonspecific infectious/inflammatory colitis.  Otherwise no specific findings.  Urinalysis negative for UTI per my review and interpretation pregnancy test is negative.  Will prescribe Zofran and Bentyl.  Discharged in good condition.  Understands return precautions.  This chart was dictated using voice recognition software.  Despite best efforts to  proofread,  errors can occur which can change the documentation meaning.         Final Clinical Impression(s) / ED Diagnoses Final diagnoses:  Generalized abdominal pain  Nausea and vomiting, unspecified vomiting type    Rx / DC Orders ED Discharge Orders          Ordered    ondansetron (ZOFRAN-ODT) 4 MG disintegrating tablet  Every 8 hours PRN        08/15/22 0751    dicyclomine (BENTYL) 20 MG tablet  2 times daily        08/15/22 0830              Virgina Norfolk, DO 08/15/22 [phone]

## 2022-08-15 NOTE — ED Triage Notes (Signed)
Pt has had ABD pain (all over) with Nausea and diarrhea.  Pt returned from the [place] on April 13th.

## 2022-08-15 NOTE — ED Notes (Signed)
Discharge instructions, follow up care, and prescriptions reviewed and explained, pt verbalized understanding. Pt caox4, ambulatory, NAD on d/c.  

## 2022-08-16 ENCOUNTER — Telehealth: Payer: Self-pay

## 2022-08-16 NOTE — Transitions of Care (Post Inpatient/ED Visit) (Signed)
   08/16/2022  Name: Kelly Jennings MRN: [id number] DOB: 1980/07/09  Today's TOC FU Call Status: Today's TOC FU Call Status:: Unsuccessul Call (1st Attempt) Unsuccessful Call (1st Attempt) Date: 08/16/22  Attempted to reach the patient regarding the most recent Inpatient/ED visit.  Follow Up Plan: Additional outreach attempts will be made to reach the patient to complete the Transitions of Care (Post Inpatient/ED visit) call.   Signature Karena Addison, LPN [facility] Nurse Health Advisor Direct Dial [phone]

## 2022-08-16 NOTE — Telephone Encounter (Signed)
Patient returned missed call. Offered to schedule her for an ER Follow Up with her PCP. Patient declined. Says she has already been referred for GI/Colonoscopy. If she changes her mind, she will call office to set up an appt.

## 2022-08-30 ENCOUNTER — Other Ambulatory Visit: Payer: Self-pay | Admitting: *Deleted

## 2022-08-30 DIAGNOSIS — I10 Essential (primary) hypertension: Secondary | ICD-10-CM

## 2022-08-30 MED ORDER — FUROSEMIDE 20 MG PO TABS
20.0000 mg | ORAL_TABLET | Freq: Every day | ORAL | 0 refills | Status: DC
Start: 2022-08-30 — End: 2022-11-28

## 2022-10-19 ENCOUNTER — Ambulatory Visit: Payer: BC Managed Care – PPO | Admitting: Internal Medicine

## 2022-10-19 ENCOUNTER — Encounter: Payer: Self-pay | Admitting: Internal Medicine

## 2022-10-19 VITALS — BP 132/84 | HR 64 | Ht 62.0 in | Wt 231.2 lb

## 2022-10-19 DIAGNOSIS — R197 Diarrhea, unspecified: Secondary | ICD-10-CM

## 2022-10-19 DIAGNOSIS — Z83719 Family history of colon polyps, unspecified: Secondary | ICD-10-CM | POA: Diagnosis not present

## 2022-10-19 DIAGNOSIS — K219 Gastro-esophageal reflux disease without esophagitis: Secondary | ICD-10-CM

## 2022-10-19 DIAGNOSIS — R935 Abnormal findings on diagnostic imaging of other abdominal regions, including retroperitoneum: Secondary | ICD-10-CM

## 2022-10-19 MED ORDER — PLENVU 140 G PO SOLR: 1.0000 | Freq: Once | ORAL | 0 refills | Status: AC

## 2022-10-19 NOTE — Progress Notes (Signed)
HISTORY OF PRESENT ILLNESS:  Kelly Jennings is a 42 y.o. female, supervisor at [employer], with a history of obesity, hypertension, migraines, and GERD.  She presents today regarding recent problems with abdominal pain, diarrhea, abnormal CT scan, and the need for colonoscopy.  Also, chronic GERD.  She is accompanied by her mother (patient of mine).  First, the patient tells me that she has had longtime reflux disease for which she takes pantoprazole.  Off medication significant symptoms.  On medication symptoms controlled.  Significant belching.  No dysphagia.  No prior endoscopy.  Next, family history of colon cancer.  Multiple adenomatous polyps in parents.  Has not had screening.  Finally, developed acute abdominal pain and diarrhea for which she was evaluated in the emergency room August 15, 2022.  Reviewed.  CT scan, reviewed, revealed a nonspecific right-sided colitis consistent with infectious etiology.  Blood work was unremarkable including blood work was unremarkable including comprehensive metabolic panel and CBC with normal white blood cell count and hemoglobin of 12.9.  There is no bleeding.  Her symptoms lasted for about 6 days.  She did have recurrence of the same symptoms 2 weeks later which lasted about 3 days.  She has had intermittent fevers.  Since her last episode, she describes fairly chronic loose stools without bleeding.  She is status post cholecystectomy  REVIEW OF SYSTEMS:  All non-GI ROS negative unless otherwise stated in the HPI.  Past Medical History:  Diagnosis Date   GERD (gastroesophageal reflux disease)    Hypertension    Migraines     Past Surgical History:  Procedure Laterality Date   CHOLECYSTECTOMY      Social History Kelly Jennings  reports that she has never smoked. She has never used smokeless tobacco. She reports that she does not drink alcohol and does not use drugs.  family history includes Diabetes in her father;  Hyperlipidemia in her father and mother; Hypertension in her father and mother.  Allergies  Allergen Reactions   Penicillins Rash    Rash as an infant       PHYSICAL EXAMINATION: Vital signs: BP 132/84   Pulse 64   Ht 5\' 2"  (1.575 m)   Wt 231 lb 3.2 oz (104.9 kg)   LMP 09/18/2022   BMI 42.29 kg/m   Constitutional: generally well-appearing, no acute distress Psychiatric: alert and oriented x3, cooperative Eyes: extraocular movements intact, anicteric, conjunctiva pink Mouth: oral pharynx moist, no lesions Neck: supple no lymphadenopathy Cardiovascular: heart regular rate and rhythm, no murmur Lungs: clear to auscultation bilaterally Abdomen: soft, obese, nontender, nondistended, no obvious ascites, no peritoneal signs, normal bowel sounds, no organomegaly Rectal: Deferred until colonoscopy Extremities: no clubbing, cyanosis, or lower extremity edema bilaterally Skin: no lesions on visible extremities Neuro: No focal deficits.  Cranial nerves tact  ASSESSMENT:  1.  Acute illness manifested by abdominal pain, fever, diarrhea.  Most likely acute infectious colitis. 2.  Abnormal CT scan with right-sided colitis.  Most likely infectious 3.  Persistent loose stools, to some degree, after the acute illness.  Suspect postinfectious IBS.  Rule out chronic colitis such as IBD. 4.  Family history of multiple adenomatous colon polyps 5.  Chronic GERD.  Requires chronic PPI to control symptoms 5.  Obesity   PLAN:  1.  Schedule colonoscopy with biopsies to evaluate persistent diarrhea and abnormal CT scan.  As well, provide neoplasia screening given her family historyThe nature of the procedure, as well as the risks, benefits, and  alternatives were carefully and thoroughly reviewed with the patient. Ample time for discussion and questions allowed. The patient understood, was satisfied, and agreed to proceed. 2.  Schedule upper endoscopy in a patient with chronic GERD.  Rule out  Barrett's.The nature of the procedure, as well as the risks, benefits, and alternatives were carefully and thoroughly reviewed with the patient. Ample time for discussion and questions allowed. The patient understood, was satisfied, and agreed to proceed. 3.  Reflux precautions 4.  Weight loss 5.  Further recommendations after the above A total time of 60 minutes was spent preparing to see the patient, reviewing a myriad of evaluations, x-rays, and laboratories.  Obtaining comprehensive history, performing medically appropriate physical examination, counseling and educating the patient and her mother regarding the above listed issues, ordering multiple endoscopic procedures, and documenting clinical information in the health record

## 2022-10-19 NOTE — Patient Instructions (Signed)
_______________________________________________________  If your blood pressure at your visit was 140/90 or greater, please contact your primary care physician to follow up on this.  _______________________________________________________  If you are age 43 or older, your body mass index should be between 23-30. Your Body mass index is 42.29 kg/m. If this is out of the aforementioned range listed, please consider follow up with your Primary Care Provider.  If you are age 7 or younger, your body mass index should be between 19-25. Your Body mass index is 42.29 kg/m. If this is out of the aformentioned range listed, please consider follow up with your Primary Care Provider.   ________________________________________________________  The [facility] providers would like to encourage you to use [facility] to communicate with providers for non-urgent requests or questions.  Due to long hold times on the telephone, sending your provider a message by [facility] may be a faster and more efficient way to get a response.  Please allow 48 business hours for a response.  Please remember that this is for non-urgent requests.  _______________________________________________________  Kelly Jennings have been scheduled for an endoscopy and colonoscopy. Please follow the written instructions given to you at your visit today. Please pick up your prep supplies at the pharmacy within the next 1-3 days. If you use inhalers (even only as needed), please bring them with you on the day of your procedure.

## 2022-10-25 ENCOUNTER — Ambulatory Visit: Payer: BC Managed Care – PPO | Admitting: Family Medicine

## 2022-10-25 ENCOUNTER — Encounter: Payer: Self-pay | Admitting: Family Medicine

## 2022-10-25 VITALS — BP 133/85 | HR 61 | Temp 97.7°F | Ht 62.0 in | Wt 225.6 lb

## 2022-10-25 DIAGNOSIS — E559 Vitamin D deficiency, unspecified: Secondary | ICD-10-CM

## 2022-10-25 DIAGNOSIS — F339 Major depressive disorder, recurrent, unspecified: Secondary | ICD-10-CM | POA: Diagnosis not present

## 2022-10-25 DIAGNOSIS — I10 Essential (primary) hypertension: Secondary | ICD-10-CM | POA: Diagnosis not present

## 2022-10-25 DIAGNOSIS — F419 Anxiety disorder, unspecified: Secondary | ICD-10-CM

## 2022-10-25 LAB — LIPID PANEL

## 2022-10-25 LAB — CMP14+EGFR
Albumin: 4.6 g/dL (ref 3.9–4.9)
BUN/Creatinine Ratio: 12 (ref 9–23)
BUN: 11 mg/dL (ref 6–24)
Calcium: 9.4 mg/dL (ref 8.7–10.2)
Chloride: 107 mmol/L — ABNORMAL HIGH (ref 96–106)
Creatinine, Ser: 0.89 mg/dL (ref 0.57–1.00)
eGFR: 83 mL/min/{1.73_m2} (ref >59)

## 2022-10-25 LAB — THYROID PANEL WITH TSH

## 2022-10-25 LAB — CBC WITH DIFFERENTIAL/PLATELET
Eos: 3 %
Hemoglobin: 12.9 g/dL (ref 11.1–15.9)
Lymphocytes Absolute: 2.1 10*3/uL (ref 0.7–3.1)
MCHC: 32.4 g/dL (ref 31.5–35.7)
MCV: 92 fL (ref 79–97)
Monocytes: 9 %
Neutrophils: 59 %
RBC: 4.35 x10E6/uL (ref 3.77–5.28)

## 2022-10-25 LAB — VITAMIN D 25 HYDROXY (VIT D DEFICIENCY, FRACTURES)

## 2022-10-25 LAB — BAYER DCA HB A1C WAIVED: HB A1C (BAYER DCA - WAIVED): 5.7 % — ABNORMAL HIGH (ref 4.8–5.6)

## 2022-10-25 MED ORDER — FLUOXETINE HCL 20 MG PO CAPS
20.0000 mg | ORAL_CAPSULE | Freq: Every day | ORAL | 0 refills | Status: AC
Start: 2022-10-25 — End: ?

## 2022-10-25 NOTE — Patient Instructions (Addendum)
Here is a guide to help [place] find out which weight loss medications will be covered by your insurance plan.  Please check out this web site  [email or web address] and follow the instructions.    Wegovy or Zepbound   There is also a phone number you can call if you do not have access to the Internet. Call [phone] (Monday- Friday 8am-8pm) and an associate will help navigate you.   [employer] provides coverage information for more than 80% of the inquiries submitted!!   Continue to monitor your blood sugars as we discussed and record them. Bring the log to your next appointment.  Take your medications as directed.    Goal Blood glucose:    Fasting (before meals) = 80 to 130   Within 2 hours of eating = less than 180   Understanding your Hemoglobin A1c:     Diabetes Mellitus and Nutrition    I think that you would greatly benefit from seeing a nutritionist. If this is something you are interested in, please call Dr Gerilyn Pilgrim at [phone] to schedule an appointment.   When you have diabetes (diabetes mellitus), it is very important to have healthy eating habits because your blood sugar (glucose) levels are greatly affected by what you eat and drink. Eating healthy foods in the appropriate amounts, at about the same times every day, can help you: Control your blood glucose. Lower your risk of heart disease. Improve your blood pressure. Reach or maintain a healthy weight.  Every person with diabetes is different, and each person has different needs for a meal plan. Your health care provider may recommend that you work with a diet and nutrition specialist (dietitian) to make a meal plan that is best for you. Your meal plan may vary depending on factors such as: The calories you need. The medicines you take. Your weight. Your blood glucose, blood pressure, and cholesterol levels. Your activity level. Other health conditions you have, such as heart or kidney disease.  How do  carbohydrates affect me? Carbohydrates affect your blood glucose level more than any other type of food. Eating carbohydrates naturally increases the amount of glucose in your blood. Carbohydrate counting is a method for keeping track of how many carbohydrates you eat. Counting carbohydrates is important to keep your blood glucose at a healthy level, especially if you use insulin or take certain oral diabetes medicines. It is important to know how many carbohydrates you can safely have in each meal. This is different for every person. Your dietitian can help you calculate how many carbohydrates you should have at each meal and for snack. Foods that contain carbohydrates include: Bread, cereal, rice, pasta, and crackers. Potatoes and corn. Peas, beans, and lentils. Milk and yogurt. Fruit and juice. Desserts, such as cakes, cookies, ice cream, and candy.  How does alcohol affect me? Alcohol can cause a sudden decrease in blood glucose (hypoglycemia), especially if you use insulin or take certain oral diabetes medicines. Hypoglycemia can be a life-threatening condition. Symptoms of hypoglycemia (sleepiness, dizziness, and confusion) are similar to symptoms of having too much alcohol. If your health care provider says that alcohol is safe for you, follow these guidelines: Limit alcohol intake to no more than 1 drink per day for nonpregnant women and 2 drinks per day for men. One drink equals 12 oz of beer, 5 oz of wine, or 1 oz of hard liquor. Do not drink on an empty stomach. Keep yourself hydrated with water, diet soda, or unsweetened iced  tea. Keep in mind that regular soda, juice, and other mixers may contain a lot of sugar and must be counted as carbohydrates.  What are tips for following this plan?  Reading food labels Start by checking the serving size on the label. The amount of calories, carbohydrates, fats, and other nutrients listed on the label are based on one serving of the food.  Many foods contain more than one serving per package. Check the total grams (g) of carbohydrates in one serving. You can calculate the number of servings of carbohydrates in one serving by dividing the total carbohydrates by 15. For example, if a food has 30 g of total carbohydrates, it would be equal to 2 servings of carbohydrates. Check the number of grams (g) of saturated and trans fats in one serving. Choose foods that have low or no amount of these fats. Check the number of milligrams (mg) of sodium in one serving. Most people should limit total sodium intake to less than 2,300 mg per day. Always check the nutrition information of foods labeled as "low-fat" or "nonfat". These foods may be higher in added sugar or refined carbohydrates and should be avoided. Talk to your dietitian to identify your daily goals for nutrients listed on the label.  Shopping Avoid buying canned, premade, or processed foods. These foods tend to be high in fat, sodium, and added sugar. Shop around the outside edge of the grocery store. This includes fresh fruits and vegetables, bulk grains, fresh meats, and fresh dairy.  Cooking Use low-heat cooking methods, such as baking, instead of high-heat cooking methods like deep frying. Cook using healthy oils, such as olive, canola, or sunflower oil. Avoid cooking with butter, cream, or high-fat meats.  Meal planning Eat meals and snacks regularly, preferably at the same times every day. Avoid going long periods of time without eating. Eat foods high in fiber, such as fresh fruits, vegetables, beans, and whole grains. Talk to your dietitian about how many servings of carbohydrates you can eat at each meal. Eat 4-6 ounces of lean protein each day, such as lean meat, chicken, fish, eggs, or tofu. 1 ounce is equal to 1 ounce of meat, chicken, or fish, 1 egg, or 1/4 cup of tofu. Eat some foods each day that contain healthy fats, such as avocado, nuts, seeds, and  fish.  Lifestyle  Check your blood glucose regularly. Exercise at least 30 minutes 5 or more days each week, or as told by your health care provider. Take medicines as told by your health care provider. Do not use any products that contain nicotine or tobacco, such as cigarettes and e-cigarettes. If you need help quitting, ask your health care provider. Work with a Veterinary surgeon or diabetes educator to identify strategies to manage stress and any emotional and social challenges.  What are some questions to ask my health care provider? Do I need to meet with a diabetes educator? Do I need to meet with a dietitian? What number can I call if I have questions? When are the best times to check my blood glucose?  Where to find more information: American Diabetes Association: diabetes.org/food-and-fitness/food Academy of Nutrition and Dietetics: https://www.vargas.com/ [employer] of Diabetes and Digestive and Kidney Diseases (NIH): FindJewelers.cz  Summary A healthy meal plan will help you control your blood glucose and maintain a healthy lifestyle. Working with a diet and nutrition specialist (dietitian) can help you make a meal plan that is best for you. Keep in mind that carbohydrates and alcohol  have immediate effects on your blood glucose levels. It is important to count carbohydrates and to use alcohol carefully. This information is not intended to replace advice given to you by your health care provider. Make sure you discuss any questions you have with your health care provider. Document Released: 01/06/2005 Document Revised: 05/16/2016 Document Reviewed: 05/16/2016 Elsevier Interactive Patient Education  [employer].

## 2022-10-25 NOTE — Progress Notes (Signed)
Subjective:  Patient ID: Kelly Jennings, female    DOB: [birth year], 42 y.o.   MRN: [id number]  Patient Care Team: Sonny Masters, FNP as PCP - General (Family Medicine) Maisie Fus, MD as PCP - Cardiology (Cardiology)   Chief Complaint:  BMI (8 week follow up ) and trouble staying focused  (Patient states this has been ongoing but has gotten worse. )   HPI: Kelly Jennings is a 42 y.o. female presenting on 10/25/2022 for BMI (8 week follow up ) and trouble staying focused  (Patient states this has been ongoing but has gotten worse. )   1. Essential hypertension, benign Complaint with meds - Yes Current Medications - lisinopril, furosemide  Checking BP at home - no Exercising Regularly - No Watching Salt intake - Yes Pertinent ROS:  Headache - No Fatigue - Yes Visual Disturbances - No Chest pain - No Dyspnea - No Palpitations - No LE edema - No They report good compliance with medications and can restate their regimen by memory. No medication side effects.  BP Readings from Last 3 Encounters:  10/25/22 133/85  10/19/22 132/84  08/15/22 (!) 124/96     2. Morbid obesity (HCC) Has been trying to watch her diet. Took Contrave for a while but was unable to afford medication to continue. Would like to discuss other options today.   3. Vitamin D deficiency Pt is taking oral repletion therapy. Denies bone pain and tenderness, muscle weakness, fracture, and difficulty walking. Lab Results  Component Value Date   VD25OH 43.5 06/03/2022   VD25OH 44.9 11/30/2021   VD25OH 27.3 (L) 01/18/2019   Lab Results  Component Value Date   CALCIUM 9.2 08/15/2022     4. Depression, recurrent (HCC) 5. Anxiety On Lexapro but feels symptoms are worsening. Having trouble concentrating, more anxious, and having racing thoughts at night. No SI or HI reported.     10/25/2022    8:35 AM 06/03/2022    9:05 AM 11/30/2021    9:39 AM 08/17/2020    8:12 AM 05/19/2020    9:41 AM  Depression  screen PHQ 2/9  Decreased Interest 0 0 2 0 3  Down, Depressed, Hopeless 0 1 1 0 0  PHQ - 2 Score 0 1 3 0 3  Altered sleeping 3 3 1 1 3   Tired, decreased energy 0 3 3 1 3   Change in appetite 3 0 3 0 3  Feeling bad or failure about yourself  0 0 0 0 3  Trouble concentrating 3 0 3 1 3   Moving slowly or fidgety/restless 3 0 1 0 0  Suicidal thoughts 0 0 0 0 0  PHQ-9 Score 12 7 14 3 18   Difficult doing work/chores Somewhat difficult Somewhat difficult Somewhat difficult Not difficult at all Very difficult      10/25/2022    8:35 AM 06/03/2022    9:06 AM 11/30/2021    9:39 AM 08/17/2020    8:13 AM  GAD 7 : Generalized Anxiety Score  Nervous, Anxious, on Edge 3 1 3 1   Control/stop worrying 3 1 3 1   Worry too much - different things 3 1 3 1   Trouble relaxing 3 1 1 1   Restless 3 1 1 1   Easily annoyed or irritable 3 1 3 1   Afraid - awful might happen 3 1 3  0  Total GAD 7 Score 21 7 17 6   Anxiety Difficulty Somewhat difficult Somewhat difficult  Not difficult at all  Relevant past medical, surgical, family, and social history reviewed and updated as indicated.  Allergies and medications reviewed and updated. Data reviewed: Chart in Epic.   Past Medical History:  Diagnosis Date   GERD (gastroesophageal reflux disease)    Hypertension    Migraines     Past Surgical History:  Procedure Laterality Date   CHOLECYSTECTOMY      Social History   Socioeconomic History   Marital status: Married    Spouse name: Not on file   Number of children: 2   Years of education: Not on file   Highest education level: Not on file  Occupational History   Not on file  Tobacco Use   Smoking status: Never   Smokeless tobacco: Never  Vaping Use   Vaping Use: Never used  Substance and Sexual Activity   Alcohol use: No   Drug use: No   Sexual activity: Yes  Other Topics Concern   Not on file  Social History Narrative   Not on file   Social Determinants of Health   Financial  Resource Strain: Not on file  Food Insecurity: Not on file  Transportation Needs: Not on file  Physical Activity: Not on file  Stress: Not on file  Social Connections: Not on file  Intimate Partner Violence: Not on file    Outpatient Encounter Medications as of 10/25/2022  Medication Sig   FLUoxetine (PROZAC) 20 MG capsule Take 1 capsule (20 mg total) by mouth daily.   furosemide (LASIX) 20 MG tablet Take 1-2 tablets (20-40 mg total) by mouth daily.   lisinopril (ZESTRIL) 40 MG tablet Take 1 tablet (40 mg total) by mouth daily.   ondansetron (ZOFRAN-ODT) 4 MG disintegrating tablet Take 1 tablet (4 mg total) by mouth every 8 (eight) hours as needed for nausea or vomiting.   pantoprazole (PROTONIX) 20 MG tablet Take 1 tablet (20 mg total) by mouth daily.   [DISCONTINUED] escitalopram (LEXAPRO) 5 MG tablet Take 1 tablet (5 mg total) by mouth daily.   No facility-administered encounter medications on file as of 10/25/2022.    Allergies  Allergen Reactions   Penicillins Rash    Rash as an infant    Review of Systems  Constitutional:  Positive for activity change, appetite change and fatigue. Negative for chills, diaphoresis, fever and unexpected weight change.  HENT: Negative.    Eyes: Negative.   Respiratory:  Negative for cough, chest tightness and shortness of breath.   Cardiovascular:  Negative for chest pain, palpitations and leg swelling.  Gastrointestinal:  Negative for abdominal pain, blood in stool, constipation, diarrhea, nausea and vomiting.  Endocrine: Negative.  Negative for polydipsia, polyphagia and polyuria.  Genitourinary:  Negative for decreased urine volume, difficulty urinating, dysuria, frequency and urgency.  Musculoskeletal:  Negative for arthralgias and myalgias.  Skin: Negative.   Allergic/Immunologic: Negative.   Neurological:  Negative for dizziness and headaches.  Hematological: Negative.   Psychiatric/Behavioral:  Positive for agitation, decreased  concentration and sleep disturbance. Negative for behavioral problems, confusion, dysphoric mood, hallucinations, self-injury and suicidal ideas. The patient is nervous/anxious and is hyperactive.   All other systems reviewed and are negative.       Objective:  BP 133/85   Pulse 61   Temp 97.7 F (36.5 C) (Temporal)   Ht 5\' 2"  (1.575 m)   Wt 225 lb 9.6 oz (102.3 kg)   LMP 09/25/2022   SpO2 98%   BMI 41.26 kg/m    Wt Readings from Last 3 Encounters:  10/25/22 225 lb 9.6 oz (102.3 kg)  10/19/22 231 lb 3.2 oz (104.9 kg)  08/15/22 230 lb (104.3 kg)    Physical Exam Vitals and nursing note reviewed.  Constitutional:      General: She is not in acute distress.    Appearance: Normal appearance. She is well-developed and well-groomed. She is morbidly obese. She is not ill-appearing, toxic-appearing or diaphoretic.  HENT:     Head: Normocephalic and atraumatic.     Jaw: There is normal jaw occlusion.     Right Ear: Hearing normal.     Left Ear: Hearing normal.     Nose: Nose normal.     Mouth/Throat:     Lips: Pink.     Mouth: Mucous membranes are moist.     Pharynx: Oropharynx is clear. Uvula midline.  Eyes:     General: Lids are normal.     Extraocular Movements: Extraocular movements intact.     Conjunctiva/sclera: Conjunctivae normal.     Pupils: Pupils are equal, round, and reactive to light.  Neck:     Thyroid: No thyroid mass, thyromegaly or thyroid tenderness.     Vascular: No carotid bruit or JVD.     Trachea: Trachea and phonation normal.  Cardiovascular:     Rate and Rhythm: Normal rate and regular rhythm.     Chest Wall: PMI is not displaced.     Pulses: Normal pulses.     Heart sounds: Normal heart sounds. No murmur heard.    No friction rub. No gallop.  Pulmonary:     Effort: Pulmonary effort is normal. No respiratory distress.     Breath sounds: Normal breath sounds. No wheezing.  Abdominal:     General: Bowel sounds are normal. There is no distension  or abdominal bruit.     Palpations: Abdomen is soft. There is no hepatomegaly or splenomegaly.     Tenderness: There is no abdominal tenderness. There is no right CVA tenderness or left CVA tenderness.     Hernia: No hernia is present.  Musculoskeletal:        General: Normal range of motion.     Cervical back: Normal range of motion and neck supple.     Right lower leg: No edema.     Left lower leg: No edema.  Lymphadenopathy:     Cervical: No cervical adenopathy.  Skin:    General: Skin is warm and dry.     Capillary Refill: Capillary refill takes less than 2 seconds.     Coloration: Skin is not cyanotic, jaundiced or pale.     Findings: No rash.  Neurological:     General: No focal deficit present.     Mental Status: She is alert and oriented to person, place, and time.     Sensory: Sensation is intact.     Motor: Motor function is intact.     Coordination: Coordination is intact.     Gait: Gait is intact.     Deep Tendon Reflexes: Reflexes are normal and symmetric.  Psychiatric:        Attention and Perception: Attention and perception normal.        Mood and Affect: Mood and affect normal.        Speech: Speech normal.        Behavior: Behavior normal. Behavior is cooperative.        Thought Content: Thought content normal.        Cognition and Memory: Cognition and memory normal.  Judgment: Judgment normal.     Results for orders placed or performed during the hospital encounter of 08/15/22  Lipase, blood  Result Value Ref Range   Lipase 29 11 - 51 U/L  Comprehensive metabolic panel  Result Value Ref Range   Sodium 136 135 - 145 mmol/L   Potassium 4.1 3.5 - 5.1 mmol/L   Chloride 104 98 - 111 mmol/L   CO2 22 22 - 32 mmol/L   Glucose, Bld 99 70 - 99 mg/dL   BUN 9 6 - 20 mg/dL   Creatinine, Ser 4.09 0.44 - 1.00 mg/dL   Calcium 9.2 8.9 - 81.1 mg/dL   Total Protein 7.1 6.5 - 8.1 g/dL   Albumin 4.2 3.5 - 5.0 g/dL   AST 19 15 - 41 U/L   ALT 28 0 - 44 U/L    Alkaline Phosphatase 68 38 - 126 U/L   Total Bilirubin 0.7 0.3 - 1.2 mg/dL   GFR, Estimated >91 >47 mL/min   Anion gap 10 5 - 15  CBC  Result Value Ref Range   WBC 9.4 4.0 - 10.5 K/uL   RBC 4.24 3.87 - 5.11 MIL/uL   Hemoglobin 12.9 12.0 - 15.0 g/dL   HCT 82.9 56.2 - 13.0 %   MCV 92.9 80.0 - 100.0 fL   MCH 30.4 26.0 - 34.0 pg   MCHC 32.7 30.0 - 36.0 g/dL   RDW 86.5 78.4 - 69.6 %   Platelets 247 150 - 400 K/uL   nRBC 0.0 0.0 - 0.2 %  Urinalysis, Routine w reflex microscopic -Urine, Clean Catch  Result Value Ref Range   Color, Urine YELLOW YELLOW   APPearance CLEAR CLEAR   Specific Gravity, Urine 1.028 1.005 - 1.030   pH 5.5 5.0 - 8.0   Glucose, UA NEGATIVE NEGATIVE mg/dL   Hgb urine dipstick NEGATIVE NEGATIVE   Bilirubin Urine NEGATIVE NEGATIVE   Ketones, ur NEGATIVE NEGATIVE mg/dL   Protein, ur NEGATIVE NEGATIVE mg/dL   Nitrite NEGATIVE NEGATIVE   Leukocytes,Ua NEGATIVE NEGATIVE  Pregnancy, urine  Result Value Ref Range   Preg Test, Ur NEGATIVE NEGATIVE       Pertinent labs & imaging results that were available during my care of the patient were reviewed by me and considered in my medical decision making.  Assessment & Plan:  Kelly Jennings was seen today for bmi and trouble staying focused .  Diagnoses and all orders for this visit:  Essential hypertension, benign BP well controlled. Changes were not made in regimen today. Goal BP is 130/80. Pt aware to report any persistent high or low readings. DASH diet and exercise encouraged. Exercise at least 150 minutes per week and increase as tolerated. Goal BMI > 25. Stress management encouraged. Avoid nicotine and tobacco product use. Avoid excessive alcohol and NSAID's. Avoid more than 2000 mg of sodium daily. Medications as prescribed. Follow up as scheduled.  -     CBC with Differential/Platelet -     CMP14+EGFR -     Thyroid Panel With TSH -     Lipid panel  Morbid obesity (HCC) Diet and exercise encouraged. Will see if Wegovy  or Zepbound is covered. Will start if covered. Labs pending. Follow up in 8 weeks for reevaluation.  -     CBC with Differential/Platelet -     CMP14+EGFR -     Thyroid Panel With TSH -     Lipid panel -     VITAMIN D 25 Hydroxy (Vit-D Deficiency, Fractures) -  Bayer DCA Hb A1c Waived  Vitamin D deficiency Labs pending. Continue repletion therapy. If indicated, will change repletion dosage. Eat foods rich in Vit D including milk, orange juice, yogurt with vitamin D added, salmon or mackerel, canned tuna fish, cereals with vitamin D added, and cod liver oil. Get out in the sun but make sure to wear at least SPF 30 sunscreen.  -     VITAMIN D 25 Hydroxy (Vit-D Deficiency, Fractures)  Depression, recurrent (HCC) Anxiety Will change lexapro to fluoxetine to see if this will better control symptoms. Aware to report new, worsening, or persistent symptoms. Follow up in 8 weeks for reevaluation.  -     FLUoxetine (PROZAC) 20 MG capsule; Take 1 capsule (20 mg total) by mouth daily. -     Thyroid Panel With TSH     Continue all other maintenance medications.  Follow up plan: Return in about 8 weeks (around 12/20/2022) for BMI.   Continue healthy lifestyle choices, including diet (rich in fruits, vegetables, and lean proteins, and low in salt and simple carbohydrates) and exercise (at least 30 minutes of moderate physical activity daily).  Educational handout given for DM  The above assessment and management plan was discussed with the patient. The patient verbalized understanding of and has agreed to the management plan. Patient is aware to call the clinic if they develop any new symptoms or if symptoms persist or worsen. Patient is aware when to return to the clinic for a follow-up visit. Patient educated on when it is appropriate to go to the emergency department.   Kari Baars, FNP-C [facility] [phone]

## 2022-10-26 ENCOUNTER — Encounter: Payer: Self-pay | Admitting: Family Medicine

## 2022-10-26 LAB — CBC WITH DIFFERENTIAL/PLATELET
Basophils Absolute: 0 10*3/uL (ref 0.0–0.2)
Basos: 1 %
EOS (ABSOLUTE): 0.2 10*3/uL (ref 0.0–0.4)
Hematocrit: 39.8 % (ref 34.0–46.6)
Immature Grans (Abs): 0 10*3/uL (ref 0.0–0.1)
Immature Granulocytes: 0 %
Lymphs: 28 %
MCH: 29.7 pg (ref 26.6–33.0)
Monocytes Absolute: 0.7 10*3/uL (ref 0.1–0.9)
Neutrophils Absolute: 4.5 10*3/uL (ref 1.4–7.0)
Platelets: 262 10*3/uL (ref 150–450)
RDW: 13.1 % (ref 11.7–15.4)
WBC: 7.5 10*3/uL (ref 3.4–10.8)

## 2022-10-26 LAB — CMP14+EGFR
ALT: 31 IU/L (ref 0–32)
AST: 23 IU/L (ref 0–40)
Alkaline Phosphatase: 84 IU/L (ref 44–121)
Bilirubin Total: 0.8 mg/dL (ref 0.0–1.2)
CO2: 23 mmol/L (ref 20–29)
Globulin, Total: 2.4 g/dL (ref 1.5–4.5)
Glucose: 101 mg/dL — ABNORMAL HIGH (ref 70–99)
Potassium: 4.9 mmol/L (ref 3.5–5.2)
Sodium: 140 mmol/L (ref 134–144)
Total Protein: 7 g/dL (ref 6.0–8.5)

## 2022-10-26 LAB — LIPID PANEL
Chol/HDL Ratio: 5 ratio — ABNORMAL HIGH (ref 0.0–4.4)
VLDL Cholesterol Cal: 28 mg/dL (ref 5–40)

## 2022-10-26 LAB — THYROID PANEL WITH TSH
Free Thyroxine Index: 2.6 (ref 1.2–4.9)
T4, Total: 9.2 ug/dL (ref 4.5–12.0)

## 2022-10-26 MED ORDER — SEMAGLUTIDE-WEIGHT MANAGEMENT 0.25 MG/0.5ML ~~LOC~~ SOAJ
0.2500 mg | SUBCUTANEOUS | 0 refills | Status: AC
Start: 2022-10-26 — End: 2022-11-23

## 2022-10-26 MED ORDER — SEMAGLUTIDE-WEIGHT MANAGEMENT 2.4 MG/0.75ML ~~LOC~~ SOAJ
2.4000 mg | SUBCUTANEOUS | 0 refills | Status: DC
Start: 2023-02-19 — End: 2023-01-03

## 2022-10-26 MED ORDER — SEMAGLUTIDE-WEIGHT MANAGEMENT 1.7 MG/0.75ML ~~LOC~~ SOAJ
1.7000 mg | SUBCUTANEOUS | 0 refills | Status: DC
Start: 2023-01-21 — End: 2023-01-03

## 2022-10-26 MED ORDER — SEMAGLUTIDE-WEIGHT MANAGEMENT 0.5 MG/0.5ML ~~LOC~~ SOAJ
0.5000 mg | SUBCUTANEOUS | 0 refills | Status: AC
Start: 2022-11-23 — End: 2022-12-21

## 2022-10-26 MED ORDER — SEMAGLUTIDE-WEIGHT MANAGEMENT 1 MG/0.5ML ~~LOC~~ SOAJ
1.0000 mg | SUBCUTANEOUS | 0 refills | Status: DC
Start: 2022-12-23 — End: 2023-01-03

## 2022-10-26 NOTE — Addendum Note (Signed)
Addended by: Sonny Masters on: 10/26/2022 04:28 PM   Modules accepted: Orders

## 2022-10-28 ENCOUNTER — Telehealth: Payer: Self-pay

## 2022-10-28 NOTE — Telephone Encounter (Signed)
Kelly Jennings (Key: [id number]) Rx #: [id number] AVWUJW 0.25MG /0.5ML auto-injectors Form [employer] Electronic PA Form [phone] NCPDP) Created 2 days ago Sent to Plan 6 minutes ago Plan Response 6 minutes ago Submit Clinical Questions less than a minute ago Determination Wait for Determination Please wait for [employer] NCPDP 2017 to return a determination.

## 2022-10-28 NOTE — Telephone Encounter (Signed)
Patient called and said [facility] is waiting on PA for [facility]. Please call.

## 2022-10-28 NOTE — Telephone Encounter (Signed)
Pt has been notified.

## 2022-10-28 NOTE — Telephone Encounter (Signed)
Patient Advocate Encounter  Prior Authorization for [employer] 0.25MG /0.5ML auto-injectors has been approved with [employer].    Effective dates: 10/28/22 through 05/31/23

## 2022-11-09 ENCOUNTER — Encounter: Payer: Self-pay | Admitting: Internal Medicine

## 2022-11-09 ENCOUNTER — Ambulatory Visit (AMBULATORY_SURGERY_CENTER): Payer: BC Managed Care – PPO | Admitting: Internal Medicine

## 2022-11-09 VITALS — BP 131/77 | HR 66 | Temp 98.0°F | Resp 18 | Ht 62.0 in | Wt 231.0 lb

## 2022-11-09 DIAGNOSIS — R935 Abnormal findings on diagnostic imaging of other abdominal regions, including retroperitoneum: Secondary | ICD-10-CM

## 2022-11-09 DIAGNOSIS — D125 Benign neoplasm of sigmoid colon: Secondary | ICD-10-CM

## 2022-11-09 DIAGNOSIS — D124 Benign neoplasm of descending colon: Secondary | ICD-10-CM | POA: Diagnosis present

## 2022-11-09 DIAGNOSIS — K219 Gastro-esophageal reflux disease without esophagitis: Secondary | ICD-10-CM | POA: Diagnosis not present

## 2022-11-09 DIAGNOSIS — R197 Diarrhea, unspecified: Secondary | ICD-10-CM

## 2022-11-09 DIAGNOSIS — K635 Polyp of colon: Secondary | ICD-10-CM

## 2022-11-09 DIAGNOSIS — Z83719 Family history of colon polyps, unspecified: Secondary | ICD-10-CM

## 2022-11-09 MED ORDER — SODIUM CHLORIDE 0.9 % IV SOLN: 500.0000 mL | Freq: Once | INTRAVENOUS | Status: DC

## 2022-11-09 NOTE — Patient Instructions (Signed)
-Handout on polyps, hiatal hernia  provided -await pathology results -repeat colonoscopy for surveillance recommended. Date to be determined when pathology result become available   -Continue present medications   YOU HAD AN ENDOSCOPIC PROCEDURE TODAY AT THE [facility]:   Refer to the procedure report that was given to you for any specific questions about what was found during the examination.  If the procedure report does not answer your questions, please call your gastroenterologist to clarify.  If you requested that your care partner not be given the details of your procedure findings, then the procedure report has been included in a sealed envelope for you to review at your convenience later.  YOU SHOULD EXPECT: Some feelings of bloating in the abdomen. Passage of more gas than usual.  Walking can help get rid of the air that was put into your GI tract during the procedure and reduce the bloating. If you had a lower endoscopy (such as a colonoscopy or flexible sigmoidoscopy) you may notice spotting of blood in your stool or on the toilet paper. If you underwent a bowel prep for your procedure, you may not have a normal bowel movement for a few days.  Please Note:  You might notice some irritation and congestion in your nose or some drainage.  This is from the oxygen used during your procedure.  There is no need for concern and it should clear up in a day or so.  SYMPTOMS TO REPORT IMMEDIATELY:  Following lower endoscopy (colonoscopy or flexible sigmoidoscopy):  Excessive amounts of blood in the stool  Significant tenderness or worsening of abdominal pains  Swelling of the abdomen that is new, acute  Fever of 100F or higher  Following upper endoscopy (EGD)  Vomiting of blood or coffee ground material  New chest pain or pain under the shoulder blades  Painful or persistently difficult swallowing  New shortness of breath  Fever of 100F or higher  Black, tarry-looking  stools  For urgent or emergent issues, a gastroenterologist can be reached at any hour by calling [phone]. Do not use MyChart messaging for urgent concerns.    DIET:  We do recommend a small meal at first, but then you may proceed to your regular diet.  Drink plenty of fluids but you should avoid alcoholic beverages for 24 hours.  ACTIVITY:  You should plan to take it easy for the rest of today and you should NOT DRIVE or use heavy machinery until tomorrow (because of the sedation medicines used during the test).    FOLLOW UP: Our staff will call the number listed on your records the next business day following your procedure.  We will call around 7:15- 8:00 am to check on you and address any questions or concerns that you may have regarding the information given to you following your procedure. If we do not reach you, we will leave a message.     If any biopsies were taken you will be contacted by phone or by letter within the next 1-3 weeks.  Please call us at [phone] if you have not heard about the biopsies in 3 weeks.    SIGNATURES/CONFIDENTIALITY: You and/or your care partner have signed paperwork which will be entered into your electronic medical record.  These signatures attest to the fact that that the information above on your After Visit Summary has been reviewed and is understood.  Full responsibility of the confidentiality of this discharge information lies with you and/or your care-partner.

## 2022-11-09 NOTE — Op Note (Signed)
[facility] Patient Name: Kelly Jennings Procedure Date: 11/09/2022 2:46 PM MRN: [id number] Endoscopist: Wilhemina Bonito. Marina Goodell , MD, [id number] Age: 42 Referring MD:  Date of Birth: 26-Sep-1980 Gender: Female Account #: [id number] Procedure:                Upper GI endoscopy Indications:              Esophageal reflux Medicines:                Monitored Anesthesia Care Procedure:                Pre-Anesthesia Assessment:                           - Prior to the procedure, a History and Physical                            was performed, and patient medications and                            allergies were reviewed. The patient's tolerance of                            previous anesthesia was also reviewed. The risks                            and benefits of the procedure and the sedation                            options and risks were discussed with the patient.                            All questions were answered, and informed consent                            was obtained. Prior Anticoagulants: The patient has                            taken no anticoagulant or antiplatelet agents. ASA                            Grade Assessment: II - A patient with mild systemic                            disease. After reviewing the risks and benefits,                            the patient was deemed in satisfactory condition to                            undergo the procedure.                           After obtaining informed consent, the endoscope was  passed under direct vision. Throughout the                            procedure, the patient's blood pressure, pulse, and                            oxygen saturations were monitored continuously. The                            Olympus Scope [id number] was introduced through the                            mouth, and advanced to the second part of duodenum.                            The upper GI endoscopy was  accomplished without                            difficulty. The patient tolerated the procedure                            well. Scope In: Scope Out: Findings:                 The esophagus was normal.                           The stomach was normal. Small hiatal hernia.                           The examined duodenum was normal.                           The cardia and gastric fundus were normal on                            retroflexion. Complications:            No immediate complications. Estimated Blood Loss:     Estimated blood loss: none. Impression:               - Normal esophagus.                           - Normal stomach. Lateral hernia.                           - Normal examined duodenum.                           - No specimens collected. Recommendation:           - Patient has a contact number available for                            emergencies. The signs and symptoms of potential  delayed complications were discussed with the                            patient. Return to normal activities tomorrow.                            Written discharge instructions were provided to the                            patient.                           - Resume previous diet.                           - Continue present medications.                           - Reflux precautions                           - Return to the care of your primary provider Wilhemina Bonito. Marina Goodell, MD 11/09/2022 3:35:30 PM This report has been signed electronically.

## 2022-11-09 NOTE — Progress Notes (Signed)
Vss nad trans to pacu 

## 2022-11-09 NOTE — Progress Notes (Signed)
HISTORY OF PRESENT ILLNESS:   Kelly Jennings is a 42 y.o. female, supervisor at [employer], with a history of obesity, hypertension, migraines, and GERD.  She presents today regarding recent problems with abdominal pain, diarrhea, abnormal CT scan, and the need for colonoscopy.  Also, chronic GERD.  She is accompanied by her mother (patient of mine).   First, the patient tells me that she has had longtime reflux disease for which she takes pantoprazole.  Off medication significant symptoms.  On medication symptoms controlled.  Significant belching.  No dysphagia.  No prior endoscopy.   Next, family history of colon cancer.  Multiple adenomatous polyps in parents.  Has not had screening.   Finally, developed acute abdominal pain and diarrhea for which she was evaluated in the emergency room August 15, 2022.  Reviewed.  CT scan, reviewed, revealed a nonspecific right-sided colitis consistent with infectious etiology.  Blood work was unremarkable including blood work was unremarkable including comprehensive metabolic panel and CBC with normal white blood cell count and hemoglobin of 12.9.  There is no bleeding.  Her symptoms lasted for about 6 days.  She did have recurrence of the same symptoms 2 weeks later which lasted about 3 days.  She has had intermittent fevers.  Since her last episode, she describes fairly chronic loose stools without bleeding.   She is status post cholecystectomy   REVIEW OF SYSTEMS:   All non-GI ROS negative unless otherwise stated in the HPI.       Past Medical History:  Diagnosis Date   GERD (gastroesophageal reflux disease)     Hypertension     Migraines                 Past Surgical History:  Procedure Laterality Date   CHOLECYSTECTOMY              Social History Kelly WEHRENBERG  reports that she has never smoked. She has never used smokeless tobacco. She reports that she does not drink alcohol and does not use drugs.   family history  includes Diabetes in her father; Hyperlipidemia in her father and mother; Hypertension in her father and mother.   Allergies       Allergies  Allergen Reactions   Penicillins Rash      Rash as an infant            PHYSICAL EXAMINATION: Vital signs: BP 132/84   Pulse 64   Ht 5\' 2"  (1.575 m)   Wt 231 lb 3.2 oz (104.9 kg)   LMP 09/18/2022   BMI 42.29 kg/m   Constitutional: generally well-appearing, no acute distress Psychiatric: alert and oriented x3, cooperative Eyes: extraocular movements intact, anicteric, conjunctiva pink Mouth: oral pharynx moist, no lesions Neck: supple no lymphadenopathy Cardiovascular: heart regular rate and rhythm, no murmur Lungs: clear to auscultation bilaterally Abdomen: soft, obese, nontender, nondistended, no obvious ascites, no peritoneal signs, normal bowel sounds, no organomegaly Rectal: Deferred until colonoscopy Extremities: no clubbing, cyanosis, or lower extremity edema bilaterally Skin: no lesions on visible extremities Neuro: No focal deficits.  Cranial nerves tact   ASSESSMENT:   1.  Acute illness manifested by abdominal pain, fever, diarrhea.  Most likely acute infectious colitis. 2.  Abnormal CT scan with right-sided colitis.  Most likely infectious 3.  Persistent loose stools, to some degree, after the acute illness.  Suspect postinfectious IBS.  Rule out chronic colitis such as IBD. 4.  Family history of multiple adenomatous colon polyps 5.  Chronic GERD.  Requires chronic PPI to control symptoms 5.  Obesity     PLAN:   1.  Schedule colonoscopy with biopsies to evaluate persistent diarrhea and abnormal CT scan.  As well, provide neoplasia screening given her family historyThe nature of the procedure, as well as the risks, benefits, and alternatives were carefully and thoroughly reviewed with the patient. Ample time for discussion and questions allowed. The patient understood, was satisfied, and agreed to proceed. 2.  Schedule  upper endoscopy in a patient with chronic GERD.  Rule out Barrett's.The nature of the procedure, as well as the risks, benefits, and alternatives were carefully and thoroughly reviewed with the patient. Ample time for discussion and questions allowed. The patient understood, was satisfied, and agreed to proceed. 3.  Reflux precautions 4.  Weight loss 5.  Further recommendations after the above

## 2022-11-09 NOTE — Progress Notes (Signed)
Vitals-Kelly Jennings  Pt's states no medical or surgical changes since previsit or office visit. 

## 2022-11-09 NOTE — Op Note (Signed)
[facility] Patient Name: Kelly Jennings Procedure Date: 11/09/2022 2:46 PM MRN: [id number] Endoscopist: Wilhemina Bonito. Marina Goodell , MD, [id number] Age: 42 Referring MD:  Date of Birth: November 15, 1980 Gender: Female Account #: [id number] Procedure:                Colonoscopy with cold snare polypectomy x 3; with                            biopsies Indications:              Abdominal pain, Diarrhea, Abnormal CT of the GI                            tract Medicines:                Monitored Anesthesia Care Procedure:                Pre-Anesthesia Assessment:                           - Prior to the procedure, a History and Physical                            was performed, and patient medications and                            allergies were reviewed. The patient's tolerance of                            previous anesthesia was also reviewed. The risks                            and benefits of the procedure and the sedation                            options and risks were discussed with the patient.                            All questions were answered, and informed consent                            was obtained. Prior Anticoagulants: The patient has                            taken no anticoagulant or antiplatelet agents. ASA                            Grade Assessment: II - A patient with mild systemic                            disease. After reviewing the risks and benefits,                            the patient was deemed in satisfactory condition to  undergo the procedure.                           After obtaining informed consent, the colonoscope                            was passed under direct vision. Throughout the                            procedure, the patient's blood pressure, pulse, and                            oxygen saturations were monitored continuously. The                            CF [id number] #[id number] was introduced through the anus                             and advanced to the the cecum, identified by                            appendiceal orifice and ileocecal valve. The                            ileocecal valve, appendiceal orifice, and rectum                            were photographed. The quality of the bowel                            preparation was excellent. The colonoscopy was                            performed without difficulty. The patient tolerated                            the procedure well. The bowel preparation used was                            SUPREP via split dose instruction. Scope In: 2:56:29 PM Scope Out: 3:20:04 PM Scope Withdrawal Time: 0 hours 21 minutes 16 seconds  Total Procedure Duration: 0 hours 23 minutes 35 seconds  Findings:                 Three polyps were found in the sigmoid colon and                            descending colon. The polyps were 2 to 5 mm in                            size. These polyps were removed with a cold snare.                            Resection and retrieval were complete.  The exam was otherwise without abnormality on                            direct and retroflexion views.                           Biopsies for histology were taken with a cold                            forceps from the entire colon for evaluation of                            microscopic colitis. Complications:            No immediate complications. Estimated blood loss:                            None. Estimated Blood Loss:     Estimated blood loss: none. Impression:               - Three 2 to 5 mm polyps in the sigmoid colon and                            in the descending colon, removed with a cold snare.                            Resected and retrieved.                           - The examination was otherwise normal on direct                            and retroflexion views.                           - Biopsies were taken with a cold forceps from  the                            entire colon for evaluation of microscopic colitis. Recommendation:           - Repeat colonoscopy in 7-10 years for surveillance.                           - Patient has a contact number available for                            emergencies. The signs and symptoms of potential                            delayed complications were discussed with the                            patient. Return to normal activities tomorrow.  Written discharge instructions were provided to the                            patient.                           - Resume previous diet.                           - Continue present medications.                           - Await pathology results. Wilhemina Bonito. Marina Goodell, MD 11/09/2022 3:27:48 PM This report has been signed electronically.

## 2022-11-09 NOTE — Progress Notes (Signed)
 Called to room to assist during endoscopic procedure.  Patient ID and intended procedure confirmed with present staff. Received instructions for my participation in the procedure from the performing physician.  

## 2022-11-10 ENCOUNTER — Telehealth: Payer: Self-pay

## 2022-11-10 NOTE — Telephone Encounter (Signed)
Left message on follow up call. 

## 2022-11-15 ENCOUNTER — Encounter: Payer: Self-pay | Admitting: Internal Medicine

## 2022-11-27 ENCOUNTER — Other Ambulatory Visit: Payer: Self-pay | Admitting: Family Medicine

## 2022-11-27 DIAGNOSIS — K2100 Gastro-esophageal reflux disease with esophagitis, without bleeding: Secondary | ICD-10-CM

## 2022-11-27 DIAGNOSIS — I10 Essential (primary) hypertension: Secondary | ICD-10-CM

## 2022-11-29 ENCOUNTER — Ambulatory Visit: Payer: BC Managed Care – PPO | Admitting: Family Medicine

## 2022-11-29 ENCOUNTER — Encounter: Payer: Self-pay | Admitting: Family Medicine

## 2022-11-29 VITALS — BP 115/78 | HR 85 | Temp 97.9°F | Ht 62.0 in | Wt 223.2 lb

## 2022-11-29 DIAGNOSIS — M5432 Sciatica, left side: Secondary | ICD-10-CM | POA: Diagnosis not present

## 2022-11-29 MED ORDER — PREDNISONE 20 MG PO TABS
40.0000 mg | ORAL_TABLET | Freq: Every day | ORAL | 0 refills | Status: AC
Start: 2022-11-29 — End: 2022-12-04

## 2022-11-29 NOTE — Progress Notes (Signed)
Subjective:  Patient ID: Kelly Jennings, female    DOB: Sep 06, 1980, 42 y.o.   MRN: [id number]  Patient Care Team: Sonny Masters, FNP as PCP - General (Family Medicine) Maisie Fus, MD as PCP - Cardiology (Cardiology)   Chief Complaint:  Leg Pain (Left thigh x 1 1/2 weeks after standing at a concert for a long time. )   HPI: Kelly Jennings is a 42 y.o. female presenting on 11/29/2022 for Leg Pain (Left thigh x 1 1/2 weeks after standing at a concert for a long time. )   Pt presents today for evaluation of numbness, tingling sensation to left lateral thigh. States this started after standing at a [employer] concert for several hours.   Leg Pain  The incident occurred more than 1 week ago. There was no injury mechanism. The pain is present in the left leg and left thigh. The pain is moderate. The pain has been Fluctuating since onset. Associated symptoms include numbness and tingling. Pertinent negatives include no inability to bear weight, loss of motion, loss of sensation or muscle weakness. She reports no foreign bodies present. The symptoms are aggravated by movement and palpation. She has tried nothing for the symptoms. The treatment provided no relief.       Relevant past medical, surgical, family, and social history reviewed and updated as indicated.  Allergies and medications reviewed and updated. Data reviewed: Chart in Epic.   Past Medical History:  Diagnosis Date   GERD (gastroesophageal reflux disease)    Hypertension    Migraines     Past Surgical History:  Procedure Laterality Date   CHOLECYSTECTOMY      Social History   Socioeconomic History   Marital status: Married    Spouse name: Not on file   Number of children: 2   Years of education: Not on file   Highest education level: Not on file  Occupational History   Not on file  Tobacco Use   Smoking status: Never   Smokeless tobacco: Never  Vaping Use   Vaping status: Never Used  Substance and  Sexual Activity   Alcohol use: No   Drug use: No   Sexual activity: Yes  Other Topics Concern   Not on file  Social History Narrative   Not on file   Social Determinants of Health   Financial Resource Strain: Not on file  Food Insecurity: Not on file  Transportation Needs: Not on file  Physical Activity: Not on file  Stress: Not on file  Social Connections: Not on file  Intimate Partner Violence: Not on file    Outpatient Encounter Medications as of 11/29/2022  Medication Sig   FLUoxetine (PROZAC) 20 MG capsule Take 1 capsule (20 mg total) by mouth daily.   furosemide (LASIX) 20 MG tablet TAKE 1-2 TABLETS (20-40 MG TOTAL) BY MOUTH DAILY.   lisinopril (ZESTRIL) 40 MG tablet Take 1 tablet (40 mg total) by mouth daily.   ondansetron (ZOFRAN-ODT) 4 MG disintegrating tablet Take 1 tablet (4 mg total) by mouth every 8 (eight) hours as needed for nausea or vomiting.   pantoprazole (PROTONIX) 20 MG tablet TAKE 1 TABLET BY MOUTH EVERY DAY   predniSONE (DELTASONE) 20 MG tablet Take 2 tablets (40 mg total) by mouth daily with breakfast for 5 days. 2 po daily for 5 days   Semaglutide-Weight Management 0.5 MG/0.5ML SOAJ Inject 0.5 mg into the skin once a week for 28 days.   [START ON  12/23/2022] Semaglutide-Weight Management 1 MG/0.5ML SOAJ Inject 1 mg into the skin once a week for 28 days.   [START ON 01/21/2023] Semaglutide-Weight Management 1.7 MG/0.75ML SOAJ Inject 1.7 mg into the skin once a week for 28 days.   [START ON 02/19/2023] Semaglutide-Weight Management 2.4 MG/0.75ML SOAJ Inject 2.4 mg into the skin once a week for 28 days.   No facility-administered encounter medications on file as of 11/29/2022.    Allergies  Allergen Reactions   Penicillins Rash    Rash as an infant    Review of Systems  Constitutional:  Negative for activity change, appetite change, chills, diaphoresis, fatigue, fever and unexpected weight change.  HENT: Negative.    Eyes: Negative.   Respiratory:   Negative for cough, chest tightness and shortness of breath.   Cardiovascular:  Negative for chest pain, palpitations and leg swelling.  Gastrointestinal:  Negative for abdominal pain, blood in stool, constipation, diarrhea, nausea and vomiting.  Endocrine: Negative.   Genitourinary:  Negative for dysuria, frequency and urgency.  Musculoskeletal:  Positive for myalgias. Negative for arthralgias, back pain, gait problem, joint swelling, neck pain and neck stiffness.  Skin: Negative.   Allergic/Immunologic: Negative.   Neurological:  Positive for tingling and numbness. Negative for dizziness and headaches.  Hematological: Negative.   Psychiatric/Behavioral:  Negative for confusion, hallucinations, sleep disturbance and suicidal ideas.   All other systems reviewed and are negative.       Objective:  BP 115/78   Pulse 85   Temp 97.9 F (36.6 C) (Temporal)   Ht 5\' 2"  (1.575 m)   Wt 223 lb 3.2 oz (101.2 kg)   SpO2 96%   BMI 40.82 kg/m    Wt Readings from Last 3 Encounters:  11/29/22 223 lb 3.2 oz (101.2 kg)  11/09/22 231 lb (104.8 kg)  10/25/22 225 lb 9.6 oz (102.3 kg)    Physical Exam Vitals and nursing note reviewed.  Constitutional:      General: She is not in acute distress.    Appearance: Normal appearance. She is obese. She is not ill-appearing, toxic-appearing or diaphoretic.  HENT:     Head: Normocephalic and atraumatic.     Nose: Nose normal.     Mouth/Throat:     Mouth: Mucous membranes are moist.  Eyes:     Conjunctiva/sclera: Conjunctivae normal.     Pupils: Pupils are equal, round, and reactive to light.  Cardiovascular:     Rate and Rhythm: Normal rate and regular rhythm.     Heart sounds: Normal heart sounds.  Pulmonary:     Effort: Pulmonary effort is normal.     Breath sounds: Normal breath sounds.  Musculoskeletal:     Cervical back: Neck supple.     Lumbar back: Normal.     Right hip: Normal.     Left hip: Normal.     Right upper leg: Normal.      Left upper leg: Tenderness present. No swelling, edema, deformity, lacerations or bony tenderness.     Right lower leg: No edema.     Left lower leg: No edema.     Comments: Left lateral thigh: slight tenderness with palpation  Skin:    General: Skin is warm and dry.     Capillary Refill: Capillary refill takes less than 2 seconds.  Neurological:     General: No focal deficit present.     Mental Status: She is alert and oriented to person, place, and time.  Psychiatric:  Mood and Affect: Mood normal.        Behavior: Behavior normal.        Thought Content: Thought content normal.        Judgment: Judgment normal.     Results for orders placed or performed in visit on 10/25/22  CBC with Differential/Platelet  Result Value Ref Range   WBC 7.5 3.4 - 10.8 x10E3/uL   RBC 4.35 3.77 - 5.28 x10E6/uL   Hemoglobin 12.9 11.1 - 15.9 g/dL   Hematocrit 29.5 62.1 - 46.6 %   MCV 92 79 - 97 fL   MCH 29.7 26.6 - 33.0 pg   MCHC 32.4 31.5 - 35.7 g/dL   RDW 30.8 65.7 - 84.6 %   Platelets 262 150 - 450 x10E3/uL   Neutrophils 59 Not Estab. %   Lymphs 28 Not Estab. %   Monocytes 9 Not Estab. %   Eos 3 Not Estab. %   Basos 1 Not Estab. %   Neutrophils Absolute 4.5 1.4 - 7.0 x10E3/uL   Lymphocytes Absolute 2.1 0.7 - 3.1 x10E3/uL   Monocytes Absolute 0.7 0.1 - 0.9 x10E3/uL   EOS (ABSOLUTE) 0.2 0.0 - 0.4 x10E3/uL   Basophils Absolute 0.0 0.0 - 0.2 x10E3/uL   Immature Granulocytes 0 Not Estab. %   Immature Grans (Abs) 0.0 0.0 - 0.1 x10E3/uL  CMP14+EGFR  Result Value Ref Range   Glucose 101 (H) 70 - 99 mg/dL   BUN 11 6 - 24 mg/dL   Creatinine, Ser 9.62 0.57 - 1.00 mg/dL   eGFR 83 >95 MW/UXL/2.44   BUN/Creatinine Ratio 12 9 - 23   Sodium 140 134 - 144 mmol/L   Potassium 4.9 3.5 - 5.2 mmol/L   Chloride 107 (H) 96 - 106 mmol/L   CO2 23 20 - 29 mmol/L   Calcium 9.4 8.7 - 10.2 mg/dL   Total Protein 7.0 6.0 - 8.5 g/dL   Albumin 4.6 3.9 - 4.9 g/dL   Globulin, Total 2.4 1.5 - 4.5 g/dL    Bilirubin Total 0.8 0.0 - 1.2 mg/dL   Alkaline Phosphatase 84 44 - 121 IU/L   AST 23 0 - 40 IU/L   ALT 31 0 - 32 IU/L  Thyroid Panel With TSH  Result Value Ref Range   TSH 0.854 0.450 - 4.500 uIU/mL   T4, Total 9.2 4.5 - 12.0 ug/dL   T3 Uptake Ratio 28 24 - 39 %   Free Thyroxine Index 2.6 1.2 - 4.9  Lipid panel  Result Value Ref Range   Cholesterol, Total 195 100 - 199 mg/dL   Triglycerides 010 (H) 0 - 149 mg/dL   HDL 39 (L) >27 mg/dL   VLDL Cholesterol Cal 28 5 - 40 mg/dL   LDL Chol Calc (NIH) 253 (H) 0 - 99 mg/dL   Chol/HDL Ratio 5.0 (H) 0.0 - 4.4 ratio  VITAMIN D 25 Hydroxy (Vit-D Deficiency, Fractures)  Result Value Ref Range   Vit D, 25-Hydroxy 59.8 30.0 - 100.0 ng/mL  Bayer DCA Hb A1c Waived  Result Value Ref Range   HB A1C (BAYER DCA - WAIVED) 5.7 (H) 4.8 - 5.6 %       Pertinent labs & imaging results that were available during my care of the patient were reviewed by me and considered in my medical decision making.  Assessment & Plan:  Geniyah was seen today for leg pain.  Diagnoses and all orders for this visit:  Left sided sciatica / neuropathic pain No red flags concerning  for cauda equina syndrome. Will burst with steroids. Pt aware to report any new, worsening, or persistent symptoms.  -     predniSONE (DELTASONE) 20 MG tablet; Take 2 tablets (40 mg total) by mouth daily with breakfast for 5 days. 2 po daily for 5 days     Continue all other maintenance medications.  Follow up plan: Return if symptoms worsen or fail to improve.   Continue healthy lifestyle choices, including diet (rich in fruits, vegetables, and lean proteins, and low in salt and simple carbohydrates) and exercise (at least 30 minutes of moderate physical activity daily).  Educational handout given for neuropathic pain.   The above assessment and management plan was discussed with the patient. The patient verbalized understanding of and has agreed to the management plan. Patient is aware  to call the clinic if they develop any new symptoms or if symptoms persist or worsen. Patient is aware when to return to the clinic for a follow-up visit. Patient educated on when it is appropriate to go to the emergency department.   Kari Baars, FNP-C [facility] [phone]

## 2022-11-30 ENCOUNTER — Ambulatory Visit: Payer: BC Managed Care – PPO

## 2022-12-05 ENCOUNTER — Encounter: Payer: Self-pay | Admitting: Family Medicine

## 2022-12-07 ENCOUNTER — Ambulatory Visit: Payer: BC Managed Care – PPO | Admitting: Family Medicine

## 2022-12-07 ENCOUNTER — Encounter: Payer: Self-pay | Admitting: Family Medicine

## 2022-12-07 ENCOUNTER — Ambulatory Visit (INDEPENDENT_AMBULATORY_CARE_PROVIDER_SITE_OTHER): Payer: BC Managed Care – PPO

## 2022-12-07 VITALS — BP 134/78 | HR 70 | Temp 97.6°F | Ht 62.0 in | Wt 222.8 lb

## 2022-12-07 DIAGNOSIS — M5432 Sciatica, left side: Secondary | ICD-10-CM | POA: Diagnosis not present

## 2022-12-07 DIAGNOSIS — E6609 Other obesity due to excess calories: Secondary | ICD-10-CM

## 2022-12-07 DIAGNOSIS — Z7689 Persons encountering health services in other specified circumstances: Secondary | ICD-10-CM | POA: Diagnosis not present

## 2022-12-07 DIAGNOSIS — E559 Vitamin D deficiency, unspecified: Secondary | ICD-10-CM

## 2022-12-07 DIAGNOSIS — M62838 Other muscle spasm: Secondary | ICD-10-CM

## 2022-12-07 DIAGNOSIS — Z6839 Body mass index (BMI) 39.0-39.9, adult: Secondary | ICD-10-CM

## 2022-12-07 MED ORDER — CYCLOBENZAPRINE HCL 5 MG PO TABS: 5.0000 mg | ORAL_TABLET | Freq: Three times a day (TID) | ORAL | 0 refills | Status: DC | PRN

## 2022-12-07 NOTE — Progress Notes (Signed)
Subjective:  Patient ID: Kelly Jennings, female    DOB: 05-23-1980, 42 y.o.   MRN: [id number]  Patient Care Team: Sonny Masters, FNP as PCP - General (Family Medicine) Wyline Mood Alben Spittle, MD as PCP - Cardiology (Cardiology)   Chief Complaint:  Leg Pain (Left leg- patient states it is no better )   HPI: Kelly Jennings is a 42 y.o. female presenting on 12/07/2022 for Leg Pain (Left leg- patient states it is no better )   1. Left thigh pain 2. Muscle spasm Pt reports ongoing left lower thigh pain and spasm. States this has improved some with the prednisone. States now the pain is just in her lower thigh. No swelling, erythema, or loss of function.   3. Class 2 obesity due to excess calories without serious comorbidity with body mass index (BMI) of 39.0 to 39.9 in adult 4. Encounter for weight management Pt has been doing well on Wegovy, she is down three pounds since initiation of medications 3 weeks ago. She denies adverse side effects from the medications and reports she is drinking adequate water. She is trying to watch her diet and stays active.   5. Vitamin D deficiency Pt is not taking oral repletion therapy. Denies bone pain and tenderness, muscle weakness, fracture, and difficulty walking. Lab Results  Component Value Date   VD25OH 59.8 10/25/2022   VD25OH 43.5 06/03/2022   VD25OH 44.9 11/30/2021   Lab Results  Component Value Date   CALCIUM 9.4 10/25/2022         Relevant past medical, surgical, family, and social history reviewed and updated as indicated.  Allergies and medications reviewed and updated. Data reviewed: Chart in Epic.   Past Medical History:  Diagnosis Date   GERD (gastroesophageal reflux disease)    Hypertension    Migraines     Past Surgical History:  Procedure Laterality Date   CHOLECYSTECTOMY      Social History   Socioeconomic History   Marital status: Married    Spouse name: Not on file   Number of children: 2   Years of  education: Not on file   Highest education level: Not on file  Occupational History   Not on file  Tobacco Use   Smoking status: Never   Smokeless tobacco: Never  Vaping Use   Vaping status: Never Used  Substance and Sexual Activity   Alcohol use: No   Drug use: No   Sexual activity: Yes  Other Topics Concern   Not on file  Social History Narrative   Not on file   Social Determinants of Health   Financial Resource Strain: Not on file  Food Insecurity: Not on file  Transportation Needs: Not on file  Physical Activity: Not on file  Stress: Not on file  Social Connections: Not on file  Intimate Partner Violence: Not on file    Outpatient Encounter Medications as of 12/07/2022  Medication Sig   cyclobenzaprine (FLEXERIL) 5 MG tablet Take 1 tablet (5 mg total) by mouth 3 (three) times daily as needed for muscle spasms.   FLUoxetine (PROZAC) 20 MG capsule Take 1 capsule (20 mg total) by mouth daily.   furosemide (LASIX) 20 MG tablet TAKE 1-2 TABLETS (20-40 MG TOTAL) BY MOUTH DAILY.   lisinopril (ZESTRIL) 40 MG tablet Take 1 tablet (40 mg total) by mouth daily.   ondansetron (ZOFRAN-ODT) 4 MG disintegrating tablet Take 1 tablet (4 mg total) by mouth every 8 (eight) hours as  needed for nausea or vomiting.   pantoprazole (PROTONIX) 20 MG tablet TAKE 1 TABLET BY MOUTH EVERY DAY   Semaglutide-Weight Management 0.5 MG/0.5ML SOAJ Inject 0.5 mg into the skin once a week for 28 days.   [START ON 12/23/2022] Semaglutide-Weight Management 1 MG/0.5ML SOAJ Inject 1 mg into the skin once a week for 28 days.   [START ON 01/21/2023] Semaglutide-Weight Management 1.7 MG/0.75ML SOAJ Inject 1.7 mg into the skin once a week for 28 days.   [START ON 02/19/2023] Semaglutide-Weight Management 2.4 MG/0.75ML SOAJ Inject 2.4 mg into the skin once a week for 28 days.   No facility-administered encounter medications on file as of 12/07/2022.    Allergies  Allergen Reactions   Penicillins Rash    Rash as an  infant    Review of Systems  Constitutional:  Negative for activity change, appetite change, chills, diaphoresis, fatigue, fever and unexpected weight change.  HENT: Negative.    Eyes: Negative.  Negative for photophobia and visual disturbance.  Respiratory:  Negative for cough, chest tightness and shortness of breath.   Cardiovascular:  Negative for chest pain, palpitations and leg swelling.  Gastrointestinal:  Negative for abdominal distention, abdominal pain, anal bleeding, blood in stool, constipation, diarrhea, nausea, rectal pain and vomiting.  Endocrine: Negative.  Negative for cold intolerance, heat intolerance, polydipsia, polyphagia and polyuria.  Genitourinary:  Negative for decreased urine volume, difficulty urinating, dysuria, frequency and urgency.  Musculoskeletal:  Positive for myalgias. Negative for arthralgias, back pain, gait problem and joint swelling.  Skin: Negative.   Allergic/Immunologic: Negative.   Neurological:  Negative for dizziness, tremors, seizures, syncope, facial asymmetry, speech difficulty, weakness, light-headedness, numbness and headaches.  Hematological: Negative.   Psychiatric/Behavioral:  Negative for confusion, hallucinations, sleep disturbance and suicidal ideas.   All other systems reviewed and are negative.       Objective:  BP 134/78   Pulse 70   Temp 97.6 F (36.4 C) (Temporal)   Ht 5\' 2"  (1.575 m)   Wt 222 lb 12.8 oz (101.1 kg)   SpO2 95%   BMI 40.75 kg/m    Wt Readings from Last 3 Encounters:  12/07/22 222 lb 12.8 oz (101.1 kg)  11/29/22 223 lb 3.2 oz (101.2 kg)  11/09/22 231 lb (104.8 kg)    Physical Exam Vitals and nursing note reviewed.  Constitutional:      General: She is not in acute distress.    Appearance: Normal appearance. She is well-developed and well-groomed. She is morbidly obese. She is not ill-appearing, toxic-appearing or diaphoretic.  HENT:     Head: Normocephalic and atraumatic.     Jaw: There is normal  jaw occlusion.     Right Ear: Hearing normal.     Left Ear: Hearing normal.     Nose: Nose normal.     Mouth/Throat:     Lips: Pink.     Mouth: Mucous membranes are moist.     Pharynx: Oropharynx is clear. Uvula midline.  Eyes:     General: Lids are normal.     Extraocular Movements: Extraocular movements intact.     Conjunctiva/sclera: Conjunctivae normal.     Pupils: Pupils are equal, round, and reactive to light.  Neck:     Thyroid: No thyroid mass, thyromegaly or thyroid tenderness.     Vascular: No carotid bruit or JVD.     Trachea: Trachea and phonation normal.  Cardiovascular:     Rate and Rhythm: Normal rate and regular rhythm.  Chest Wall: PMI is not displaced.     Pulses: Normal pulses.     Heart sounds: Normal heart sounds. No murmur heard.    No friction rub. No gallop.  Pulmonary:     Effort: Pulmonary effort is normal. No respiratory distress.     Breath sounds: Normal breath sounds. No wheezing.  Abdominal:     General: Bowel sounds are normal. There is no distension or abdominal bruit.     Palpations: Abdomen is soft. There is no hepatomegaly or splenomegaly.     Tenderness: There is no abdominal tenderness. There is no right CVA tenderness or left CVA tenderness.     Hernia: No hernia is present.  Musculoskeletal:        General: Normal range of motion.     Cervical back: Normal range of motion and neck supple.     Left hip: Normal.     Left upper leg: Tenderness present. No swelling, edema, deformity, lacerations or bony tenderness.     Left knee: Normal.     Right lower leg: No edema.     Left lower leg: No edema.       Legs:  Lymphadenopathy:     Cervical: No cervical adenopathy.  Skin:    General: Skin is warm and dry.     Capillary Refill: Capillary refill takes less than 2 seconds.     Coloration: Skin is not cyanotic, jaundiced or pale.     Findings: No rash.  Neurological:     General: No focal deficit present.     Mental Status: She is  alert and oriented to person, place, and time.     Sensory: Sensation is intact.     Motor: Motor function is intact.     Coordination: Coordination is intact.     Gait: Gait is intact.     Deep Tendon Reflexes: Reflexes are normal and symmetric.  Psychiatric:        Attention and Perception: Attention and perception normal.        Mood and Affect: Mood and affect normal.        Speech: Speech normal.        Behavior: Behavior normal. Behavior is cooperative.        Thought Content: Thought content normal.        Cognition and Memory: Cognition and memory normal.        Judgment: Judgment normal.     Results for orders placed or performed in visit on 10/25/22  CBC with Differential/Platelet  Result Value Ref Range   WBC 7.5 3.4 - 10.8 x10E3/uL   RBC 4.35 3.77 - 5.28 x10E6/uL   Hemoglobin 12.9 11.1 - 15.9 g/dL   Hematocrit 14.7 82.9 - 46.6 %   MCV 92 79 - 97 fL   MCH 29.7 26.6 - 33.0 pg   MCHC 32.4 31.5 - 35.7 g/dL   RDW 56.2 13.0 - 86.5 %   Platelets 262 150 - 450 x10E3/uL   Neutrophils 59 Not Estab. %   Lymphs 28 Not Estab. %   Monocytes 9 Not Estab. %   Eos 3 Not Estab. %   Basos 1 Not Estab. %   Neutrophils Absolute 4.5 1.4 - 7.0 x10E3/uL   Lymphocytes Absolute 2.1 0.7 - 3.1 x10E3/uL   Monocytes Absolute 0.7 0.1 - 0.9 x10E3/uL   EOS (ABSOLUTE) 0.2 0.0 - 0.4 x10E3/uL   Basophils Absolute 0.0 0.0 - 0.2 x10E3/uL   Immature Granulocytes 0 Not Estab. %  Immature Grans (Abs) 0.0 0.0 - 0.1 x10E3/uL  CMP14+EGFR  Result Value Ref Range   Glucose 101 (H) 70 - 99 mg/dL   BUN 11 6 - 24 mg/dL   Creatinine, Ser 4.09 0.57 - 1.00 mg/dL   eGFR 83 >81 XB/JYN/8.29   BUN/Creatinine Ratio 12 9 - 23   Sodium 140 134 - 144 mmol/L   Potassium 4.9 3.5 - 5.2 mmol/L   Chloride 107 (H) 96 - 106 mmol/L   CO2 23 20 - 29 mmol/L   Calcium 9.4 8.7 - 10.2 mg/dL   Total Protein 7.0 6.0 - 8.5 g/dL   Albumin 4.6 3.9 - 4.9 g/dL   Globulin, Total 2.4 1.5 - 4.5 g/dL   Bilirubin Total 0.8 0.0 -  1.2 mg/dL   Alkaline Phosphatase 84 44 - 121 IU/L   AST 23 0 - 40 IU/L   ALT 31 0 - 32 IU/L  Thyroid Panel With TSH  Result Value Ref Range   TSH 0.854 0.450 - 4.500 uIU/mL   T4, Total 9.2 4.5 - 12.0 ug/dL   T3 Uptake Ratio 28 24 - 39 %   Free Thyroxine Index 2.6 1.2 - 4.9  Lipid panel  Result Value Ref Range   Cholesterol, Total 195 100 - 199 mg/dL   Triglycerides 562 (H) 0 - 149 mg/dL   HDL 39 (L) >13 mg/dL   VLDL Cholesterol Cal 28 5 - 40 mg/dL   LDL Chol Calc (NIH) 086 (H) 0 - 99 mg/dL   Chol/HDL Ratio 5.0 (H) 0.0 - 4.4 ratio  VITAMIN D 25 Hydroxy (Vit-D Deficiency, Fractures)  Result Value Ref Range   Vit D, 25-Hydroxy 59.8 30.0 - 100.0 ng/mL  Bayer DCA Hb A1c Waived  Result Value Ref Range   HB A1C (BAYER DCA - WAIVED) 5.7 (H) 4.8 - 5.6 %     X-Ray: lumbar spine: No acute findings. Preliminary x-ray reading by Kari Baars, FNP-C, [facility].   Pertinent labs & imaging results that were available during my care of the patient were reviewed by me and considered in my medical decision making.  Assessment & Plan:  Gwendolyne was seen today for leg pain.  Diagnoses and all orders for this visit:  Left sided sciatica Muscle spasm Imaging without acute findings. No red flags concerning for cauda equina syndrome. Has improved with prednisone but not completely resolved. Has spasm to left lateral thigh, will trail below. Aware if symptoms persist or worsen, will obtain US.  -     DG Lumbar Spine 2-3 Views -     cyclobenzaprine (FLEXERIL) 5 MG tablet; Take 1 tablet (5 mg total) by mouth 3 (three) times daily as needed for muscle spasms.  Class 2 obesity due to excess calories without serious comorbidity with body mass index (BMI) of 39.0 to 39.9 in adult Encounter for weight management Doing well on Wegovy, down 3 lbs. Will update labs today. Diet and exercise emphasized. Continue Wegovy as prescribed.  -     CMP14+EGFR -     CBC with Differential/Platelet -     Thyroid Panel With  TSH -     Lipid panel -     VITAMIN D 25 Hydroxy (Vit-D Deficiency, Fractures)  Vitamin D deficiency Labs pending. Will start repletion therapy if warranted. Eat foods rich in Vit D including milk, orange juice, yogurt with vitamin D added, salmon or mackerel, canned tuna fish, cereals with vitamin D added, and cod liver oil. Get out in the sun but  make sure to wear at least SPF 30 sunscreen.  -     VITAMIN D 25 Hydroxy (Vit-D Deficiency, Fractures)     Continue all other maintenance medications.  Follow up plan: Return in 3 months (on 03/09/2023), or if symptoms worsen or fail to improve, for BMI.   Continue healthy lifestyle choices, including diet (rich in fruits, vegetables, and lean proteins, and low in salt and simple carbohydrates) and exercise (at least 30 minutes of moderate physical activity daily).  Educational handout given for muscle cramps  The above assessment and management plan was discussed with the patient. The patient verbalized understanding of and has agreed to the management plan. Patient is aware to call the clinic if they develop any new symptoms or if symptoms persist or worsen. Patient is aware when to return to the clinic for a follow-up visit. Patient educated on when it is appropriate to go to the emergency department.   Kari Baars, FNP-C [facility] [phone]

## 2022-12-08 LAB — CBC WITH DIFFERENTIAL/PLATELET
Basophils Absolute: 0 10*3/uL (ref 0.0–0.2)
Basos: 0 %
EOS (ABSOLUTE): 0.2 10*3/uL (ref 0.0–0.4)
Eos: 2 %
Hematocrit: 38.3 % (ref 34.0–46.6)
Hemoglobin: 12.8 g/dL (ref 11.1–15.9)
Immature Grans (Abs): 0 10*3/uL (ref 0.0–0.1)
Immature Granulocytes: 0 %
Lymphocytes Absolute: 2 10*3/uL (ref 0.7–3.1)
Lymphs: 24 %
MCH: 30.5 pg (ref 26.6–33.0)
MCHC: 33.4 g/dL (ref 31.5–35.7)
MCV: 91 fL (ref 79–97)
Monocytes Absolute: 0.7 10*3/uL (ref 0.1–0.9)
Monocytes: 9 %
Neutrophils Absolute: 5.2 10*3/uL (ref 1.4–7.0)
Neutrophils: 65 %
Platelets: 263 10*3/uL (ref 150–450)
RBC: 4.19 x10E6/uL (ref 3.77–5.28)
RDW: 13.3 % (ref 11.7–15.4)
WBC: 8.1 10*3/uL (ref 3.4–10.8)

## 2022-12-08 LAB — LIPID PANEL
Chol/HDL Ratio: 5.3 ratio — ABNORMAL HIGH (ref 0.0–4.4)
Cholesterol, Total: 211 mg/dL — ABNORMAL HIGH (ref 100–199)
HDL: 40 mg/dL (ref >39)
LDL Chol Calc (NIH): 137 mg/dL — ABNORMAL HIGH (ref 0–99)
Triglycerides: 188 mg/dL — ABNORMAL HIGH (ref 0–149)
VLDL Cholesterol Cal: 34 mg/dL (ref 5–40)

## 2022-12-08 LAB — THYROID PANEL WITH TSH
Free Thyroxine Index: 2.3 (ref 1.2–4.9)
T3 Uptake Ratio: 27 % (ref 24–39)
T4, Total: 8.6 ug/dL (ref 4.5–12.0)
TSH: 0.87 u[IU]/mL (ref 0.450–4.500)

## 2022-12-08 LAB — CMP14+EGFR
ALT: 27 IU/L (ref 0–32)
AST: 24 IU/L (ref 0–40)
Albumin: 4.4 g/dL (ref 3.9–4.9)
Alkaline Phosphatase: 84 IU/L (ref 44–121)
BUN/Creatinine Ratio: 13 (ref 9–23)
BUN: 12 mg/dL (ref 6–24)
Bilirubin Total: 0.8 mg/dL (ref 0.0–1.2)
CO2: 22 mmol/L (ref 20–29)
Calcium: 9.4 mg/dL (ref 8.7–10.2)
Chloride: 104 mmol/L (ref 96–106)
Creatinine, Ser: 0.89 mg/dL (ref 0.57–1.00)
Globulin, Total: 2.4 g/dL (ref 1.5–4.5)
Glucose: 95 mg/dL (ref 70–99)
Potassium: 4.4 mmol/L (ref 3.5–5.2)
Sodium: 139 mmol/L (ref 134–144)
Total Protein: 6.8 g/dL (ref 6.0–8.5)
eGFR: 83 mL/min/{1.73_m2} (ref >59)

## 2022-12-08 LAB — VITAMIN D 25 HYDROXY (VIT D DEFICIENCY, FRACTURES): Vit D, 25-Hydroxy: 65.2 ng/mL (ref 30.0–100.0)

## 2022-12-20 ENCOUNTER — Ambulatory Visit: Payer: BC Managed Care – PPO | Admitting: Family Medicine

## 2022-12-31 ENCOUNTER — Other Ambulatory Visit: Payer: Self-pay | Admitting: Family Medicine

## 2023-01-03 ENCOUNTER — Encounter: Payer: Self-pay | Admitting: Family Medicine

## 2023-01-03 ENCOUNTER — Ambulatory Visit (INDEPENDENT_AMBULATORY_CARE_PROVIDER_SITE_OTHER): Payer: BC Managed Care – PPO | Admitting: Family Medicine

## 2023-01-03 DIAGNOSIS — M5431 Sciatica, right side: Secondary | ICD-10-CM | POA: Diagnosis not present

## 2023-01-03 DIAGNOSIS — M62838 Other muscle spasm: Secondary | ICD-10-CM | POA: Diagnosis not present

## 2023-01-03 DIAGNOSIS — M5432 Sciatica, left side: Secondary | ICD-10-CM

## 2023-01-03 MED ORDER — SEMAGLUTIDE-WEIGHT MANAGEMENT 1.7 MG/0.75ML ~~LOC~~ SOAJ: 1.7000 mg | SUBCUTANEOUS | 0 refills | Status: AC

## 2023-01-03 MED ORDER — CYCLOBENZAPRINE HCL 5 MG PO TABS: 5.0000 mg | ORAL_TABLET | Freq: Three times a day (TID) | ORAL | 1 refills | Status: DC | PRN

## 2023-01-03 MED ORDER — SEMAGLUTIDE-WEIGHT MANAGEMENT 1 MG/0.5ML ~~LOC~~ SOAJ
1.0000 mg | SUBCUTANEOUS | 0 refills | Status: AC
Start: 2023-01-03 — End: 2023-01-31

## 2023-01-03 MED ORDER — SEMAGLUTIDE-WEIGHT MANAGEMENT 2.4 MG/0.75ML ~~LOC~~ SOAJ
2.4000 mg | SUBCUTANEOUS | 0 refills | Status: DC
Start: 2023-02-19 — End: 2023-03-17

## 2023-01-03 NOTE — Progress Notes (Signed)
Subjective:  Patient ID: Kelly Jennings, female    DOB: Mar 27, 1981, 42 y.o.   MRN: [id number]  Patient Care Team: Sonny Masters, FNP as PCP - General (Family Medicine) Wyline Mood Alben Spittle, MD as PCP - Cardiology (Cardiology)   Chief Complaint:  Sciatica (Left leg- patient states it has not improved )   HPI: Kelly Jennings is a 42 y.o. female presenting on 01/03/2023 for Sciatica (Left leg- patient states it has not improved )    1. Morbid obesity (HCC) States [facility] did not refill her [facility] as prescribed. She would like to continue this as she has been doing well. No adverse side effects reported from medications.   2. Left sided sciatica 3. Right sided sciatica 4. Muscle spasm This has been ongoing for a month. Imaging of lumbar spine revealed minimal degenerative changes. Prednisone and Flexeril were minimally beneficial. She reports sharp right buttock pain with radiation to left lateral thigh. No loss of bowel or bladder. No loss of function. No weakness. She has not been to PT or evaluated by ortho.      Relevant past medical, surgical, family, and social history reviewed and updated as indicated.  Allergies and medications reviewed and updated. Data reviewed: Chart in Epic.   Past Medical History:  Diagnosis Date   GERD (gastroesophageal reflux disease)    Hypertension    Migraines     Past Surgical History:  Procedure Laterality Date   CHOLECYSTECTOMY      Social History   Socioeconomic History   Marital status: Married    Spouse name: Not on file   Number of children: 2   Years of education: Not on file   Highest education level: Not on file  Occupational History   Not on file  Tobacco Use   Smoking status: Never   Smokeless tobacco: Never  Vaping Use   Vaping status: Never Used  Substance and Sexual Activity   Alcohol use: No   Drug use: No   Sexual activity: Yes  Other Topics Concern   Not on file  Social History Narrative   Not on file    Social Determinants of Health   Financial Resource Strain: Not on file  Food Insecurity: Not on file  Transportation Needs: Not on file  Physical Activity: Not on file  Stress: Not on file  Social Connections: Not on file  Intimate Partner Violence: Not on file    Outpatient Encounter Medications as of 01/03/2023  Medication Sig   FLUoxetine (PROZAC) 20 MG capsule Take 1 capsule (20 mg total) by mouth daily.   furosemide (LASIX) 20 MG tablet TAKE 1-2 TABLETS (20-40 MG TOTAL) BY MOUTH DAILY.   lisinopril (ZESTRIL) 40 MG tablet Take 1 tablet (40 mg total) by mouth daily.   ondansetron (ZOFRAN-ODT) 4 MG disintegrating tablet Take 1 tablet (4 mg total) by mouth every 8 (eight) hours as needed for nausea or vomiting.   pantoprazole (PROTONIX) 20 MG tablet TAKE 1 TABLET BY MOUTH EVERY DAY   [DISCONTINUED] cyclobenzaprine (FLEXERIL) 5 MG tablet Take 1 tablet (5 mg total) by mouth 3 (three) times daily as needed for muscle spasms.   [DISCONTINUED] Semaglutide-Weight Management 1 MG/0.5ML SOAJ Inject 1 mg into the skin once a week for 28 days.   [DISCONTINUED] Semaglutide-Weight Management 1.7 MG/0.75ML SOAJ Inject 1.7 mg into the skin once a week for 28 days.   [DISCONTINUED] Semaglutide-Weight Management 2.4 MG/0.75ML SOAJ Inject 2.4 mg into the skin once a  week for 28 days.   cyclobenzaprine (FLEXERIL) 5 MG tablet Take 1 tablet (5 mg total) by mouth 3 (three) times daily as needed for muscle spasms.   Semaglutide-Weight Management 1 MG/0.5ML SOAJ Inject 1 mg into the skin once a week for 28 days.   [START ON 01/21/2023] Semaglutide-Weight Management 1.7 MG/0.75ML SOAJ Inject 1.7 mg into the skin once a week for 28 days.   [START ON 02/19/2023] Semaglutide-Weight Management 2.4 MG/0.75ML SOAJ Inject 2.4 mg into the skin once a week for 28 days.   No facility-administered encounter medications on file as of 01/03/2023.    Allergies  Allergen Reactions   Penicillins Rash    Rash as an infant     Review of Systems  Constitutional:  Negative for activity change, appetite change, chills, diaphoresis, fatigue, fever and unexpected weight change.  HENT: Negative.    Eyes: Negative.   Respiratory:  Negative for cough, chest tightness and shortness of breath.   Cardiovascular:  Negative for chest pain, palpitations and leg swelling.  Gastrointestinal:  Negative for abdominal pain, blood in stool, constipation, diarrhea, nausea and vomiting.  Endocrine: Negative.  Negative for polydipsia, polyphagia and polyuria.  Genitourinary:  Negative for decreased urine volume, difficulty urinating, dysuria, frequency and urgency.  Musculoskeletal:  Positive for arthralgias, back pain and myalgias. Negative for gait problem, joint swelling, neck pain and neck stiffness.  Skin: Negative.   Allergic/Immunologic: Negative.   Neurological:  Negative for dizziness, tremors, seizures, syncope, facial asymmetry, speech difficulty, weakness, light-headedness, numbness and headaches.  Hematological: Negative.   Psychiatric/Behavioral:  Negative for confusion, hallucinations, sleep disturbance and suicidal ideas.   All other systems reviewed and are negative.       Objective:  BP 125/87   Pulse 84   Temp 97.7 F (36.5 C) (Temporal)   Ht 5\' 2"  (1.575 m)   Wt 220 lb 12.8 oz (100.2 kg)   SpO2 95%   BMI 40.38 kg/m    Wt Readings from Last 3 Encounters:  01/03/23 220 lb 12.8 oz (100.2 kg)  12/07/22 222 lb 12.8 oz (101.1 kg)  11/29/22 223 lb 3.2 oz (101.2 kg)    Physical Exam Vitals and nursing note reviewed.  Constitutional:      General: She is not in acute distress.    Appearance: Normal appearance. She is well-developed and well-groomed. She is morbidly obese. She is not ill-appearing, toxic-appearing or diaphoretic.  HENT:     Head: Normocephalic and atraumatic.     Jaw: There is normal jaw occlusion.     Right Ear: Hearing normal.     Left Ear: Hearing normal.     Nose: Nose normal.      Mouth/Throat:     Lips: Pink.     Mouth: Mucous membranes are moist.     Pharynx: Oropharynx is clear. Uvula midline.  Eyes:     General: Lids are normal.     Extraocular Movements: Extraocular movements intact.     Conjunctiva/sclera: Conjunctivae normal.     Pupils: Pupils are equal, round, and reactive to light.  Neck:     Thyroid: No thyroid mass, thyromegaly or thyroid tenderness.     Vascular: No carotid bruit or JVD.     Trachea: Trachea and phonation normal.  Cardiovascular:     Rate and Rhythm: Normal rate and regular rhythm.     Chest Wall: PMI is not displaced.     Pulses: Normal pulses.     Heart sounds: Normal heart sounds.  No murmur heard.    No friction rub. No gallop.  Pulmonary:     Effort: Pulmonary effort is normal. No respiratory distress.     Breath sounds: Normal breath sounds. No wheezing.  Abdominal:     General: Bowel sounds are normal. There is no distension or abdominal bruit.     Palpations: Abdomen is soft. There is no hepatomegaly or splenomegaly.     Tenderness: There is no abdominal tenderness. There is no right CVA tenderness or left CVA tenderness.     Hernia: No hernia is present.  Musculoskeletal:     Cervical back: Normal range of motion and neck supple.     Thoracic back: Normal.     Lumbar back: No swelling, edema, deformity, signs of trauma, lacerations, spasms, tenderness or bony tenderness. Normal range of motion. Positive right straight leg raise test. Negative left straight leg raise test. No scoliosis.     Right hip: Tenderness present. No deformity, lacerations, bony tenderness or crepitus. Normal range of motion. Normal strength.     Left hip: No deformity, lacerations, tenderness, bony tenderness or crepitus. Normal range of motion. Normal strength.     Right upper leg: No swelling, edema, deformity, lacerations, tenderness or bony tenderness.     Left upper leg: Tenderness present. No swelling, edema, deformity, lacerations or  bony tenderness.     Right knee: Normal.     Left knee: Normal.     Right lower leg: No edema.     Left lower leg: No edema.       Legs:  Lymphadenopathy:     Cervical: No cervical adenopathy.  Skin:    General: Skin is warm and dry.     Capillary Refill: Capillary refill takes less than 2 seconds.     Coloration: Skin is not cyanotic, jaundiced or pale.     Findings: No rash.  Neurological:     General: No focal deficit present.     Mental Status: She is alert and oriented to person, place, and time.     Sensory: Sensation is intact.     Motor: Motor function is intact.     Coordination: Coordination is intact.     Gait: Gait is intact.     Deep Tendon Reflexes: Reflexes are normal and symmetric.  Psychiatric:        Attention and Perception: Attention and perception normal.        Mood and Affect: Mood and affect normal.        Speech: Speech normal.        Behavior: Behavior normal. Behavior is cooperative.        Thought Content: Thought content normal.        Cognition and Memory: Cognition and memory normal.        Judgment: Judgment normal.     Results for orders placed or performed in visit on 12/07/22  CMP14+EGFR  Result Value Ref Range   Glucose 95 70 - 99 mg/dL   BUN 12 6 - 24 mg/dL   Creatinine, Ser 1.61 0.57 - 1.00 mg/dL   eGFR 83 >09 UE/AVW/0.98   BUN/Creatinine Ratio 13 9 - 23   Sodium 139 134 - 144 mmol/L   Potassium 4.4 3.5 - 5.2 mmol/L   Chloride 104 96 - 106 mmol/L   CO2 22 20 - 29 mmol/L   Calcium 9.4 8.7 - 10.2 mg/dL   Total Protein 6.8 6.0 - 8.5 g/dL   Albumin 4.4 3.9 - 4.9  g/dL   Globulin, Total 2.4 1.5 - 4.5 g/dL   Bilirubin Total 0.8 0.0 - 1.2 mg/dL   Alkaline Phosphatase 84 44 - 121 IU/L   AST 24 0 - 40 IU/L   ALT 27 0 - 32 IU/L  CBC with Differential/Platelet  Result Value Ref Range   WBC 8.1 3.4 - 10.8 x10E3/uL   RBC 4.19 3.77 - 5.28 x10E6/uL   Hemoglobin 12.8 11.1 - 15.9 g/dL   Hematocrit 16.1 09.6 - 46.6 %   MCV 91 79 - 97 fL    MCH 30.5 26.6 - 33.0 pg   MCHC 33.4 31.5 - 35.7 g/dL   RDW 04.5 40.9 - 81.1 %   Platelets 263 150 - 450 x10E3/uL   Neutrophils 65 Not Estab. %   Lymphs 24 Not Estab. %   Monocytes 9 Not Estab. %   Eos 2 Not Estab. %   Basos 0 Not Estab. %   Neutrophils Absolute 5.2 1.4 - 7.0 x10E3/uL   Lymphocytes Absolute 2.0 0.7 - 3.1 x10E3/uL   Monocytes Absolute 0.7 0.1 - 0.9 x10E3/uL   EOS (ABSOLUTE) 0.2 0.0 - 0.4 x10E3/uL   Basophils Absolute 0.0 0.0 - 0.2 x10E3/uL   Immature Granulocytes 0 Not Estab. %   Immature Grans (Abs) 0.0 0.0 - 0.1 x10E3/uL  Thyroid Panel With TSH  Result Value Ref Range   TSH 0.870 0.450 - 4.500 uIU/mL   T4, Total 8.6 4.5 - 12.0 ug/dL   T3 Uptake Ratio 27 24 - 39 %   Free Thyroxine Index 2.3 1.2 - 4.9  Lipid panel  Result Value Ref Range   Cholesterol, Total 211 (H) 100 - 199 mg/dL   Triglycerides 914 (H) 0 - 149 mg/dL   HDL 40 >78 mg/dL   VLDL Cholesterol Cal 34 5 - 40 mg/dL   LDL Chol Calc (NIH) 295 (H) 0 - 99 mg/dL   Chol/HDL Ratio 5.3 (H) 0.0 - 4.4 ratio  VITAMIN D 25 Hydroxy (Vit-D Deficiency, Fractures)  Result Value Ref Range   Vit D, 25-Hydroxy 65.2 30.0 - 100.0 ng/mL       Pertinent labs & imaging results that were available during my care of the patient were reviewed by me and considered in my medical decision making.  Assessment & Plan:  Amiaya was seen today for sciatica.  Diagnoses and all orders for this visit:  Morbid obesity (HCC) Has been doing well on below and would like to continue. Will send to pharmacy. Diet and exercise encouraged.  -     Semaglutide-Weight Management 1 MG/0.5ML SOAJ; Inject 1 mg into the skin once a week for 28 days. -     Semaglutide-Weight Management 1.7 MG/0.75ML SOAJ; Inject 1.7 mg into the skin once a week for 28 days. -     Semaglutide-Weight Management 2.4 MG/0.75ML SOAJ; Inject 2.4 mg into the skin once a week for 28 days.  Left sided sciatica Right sided sciatica Muscle spasm Did ok with flexeril,  will refill for an as needed basis. Referral to PT and ortho placed.  -     cyclobenzaprine (FLEXERIL) 5 MG tablet; Take 1 tablet (5 mg total) by mouth 3 (three) times daily as needed for muscle spasms. -     Ambulatory referral to Physical Therapy -     Ambulatory referral to Orthopedic Surgery     Continue all other maintenance medications.  Follow up plan: Return in about 3 months (around 04/04/2023), or if symptoms worsen or fail to  improve, for BMI.   Continue healthy lifestyle choices, including diet (rich in fruits, vegetables, and lean proteins, and low in salt and simple carbohydrates) and exercise (at least 30 minutes of moderate physical activity daily).    The above assessment and management plan was discussed with the patient. The patient verbalized understanding of and has agreed to the management plan. Patient is aware to call the clinic if they develop any new symptoms or if symptoms persist or worsen. Patient is aware when to return to the clinic for a follow-up visit. Patient educated on when it is appropriate to go to the emergency department.   Kari Baars, FNP-C [facility] [phone]

## 2023-01-12 ENCOUNTER — Other Ambulatory Visit: Payer: Self-pay

## 2023-01-12 ENCOUNTER — Ambulatory Visit: Payer: BC Managed Care – PPO | Attending: Family Medicine | Admitting: Physical Therapy

## 2023-01-12 DIAGNOSIS — M5432 Sciatica, left side: Secondary | ICD-10-CM | POA: Insufficient documentation

## 2023-01-12 DIAGNOSIS — M5459 Other low back pain: Secondary | ICD-10-CM | POA: Insufficient documentation

## 2023-01-12 DIAGNOSIS — M5431 Sciatica, right side: Secondary | ICD-10-CM | POA: Insufficient documentation

## 2023-01-12 DIAGNOSIS — M62830 Muscle spasm of back: Secondary | ICD-10-CM | POA: Insufficient documentation

## 2023-01-12 DIAGNOSIS — M62838 Other muscle spasm: Secondary | ICD-10-CM | POA: Diagnosis not present

## 2023-01-12 NOTE — Therapy (Signed)
OUTPATIENT PHYSICAL THERAPY THORACOLUMBAR EVALUATION   Patient Name: Kelly Jennings MRN: [id number] DOB:December 11, 1980, 42 y.o., female Today's Date: 01/12/2023  END OF SESSION:  PT End of Session - 01/12/23 1627     Visit Number 1    Number of Visits 12    Date for PT Re-Evaluation 02/23/23    Authorization Type FOTO.    PT Start Time 0314    PT Stop Time 0401    PT Time Calculation (min) 47 min    Activity Tolerance Patient tolerated treatment well    Behavior During Therapy [facility] for tasks assessed/performed             Past Medical History:  Diagnosis Date   GERD (gastroesophageal reflux disease)    Hypertension    Migraines    Past Surgical History:  Procedure Laterality Date   CHOLECYSTECTOMY     Patient Active Problem List   Diagnosis Date Noted   Morbid obesity (HCC) 06/03/2022   Restless leg syndrome 11/16/2020   Anxiety 05/19/2020   Abdominal obesity and metabolic syndrome 05/19/2020   Prediabetes 01/29/2020   Depression, recurrent (HCC) 01/23/2020   Class 2 obesity due to excess calories without serious comorbidity with body mass index (BMI) of 39.0 to 39.9 in adult 02/19/2019   Essential hypertension, benign 02/19/2019   Vitamin D deficiency 02/19/2019   Vasodepressor syncope 06/12/2013   GERD (gastroesophageal reflux disease) 09/24/2012    REFERRING PROVIDER: Gilford Silvius  REFERRING DIAG: Left and right sided sciatica, muscle spasm.  Rationale for Evaluation and Treatment: Rehabilitation  THERAPY DIAG:  Other low back pain  Muscle spasm of back  ONSET DATE: July 2024.  SUBJECTIVE:                                                                                                                                                                                           SUBJECTIVE STATEMENT: The patient presents to the clinic with c/o left lateral and distal thigh numbness and pain in right lower back region that will "catch" sometimes and produce  high pain-levels. She states her pain began In July of this year after being at a two day concert and standing for several hours.  Her pain today is a 3-4/10 but can rise to higher levels at times, especially at the end of an 8 hours workshift which requires standing on concert.  Getting out of her car after driving home from work can be very uncomfortable.    PERTINENT HISTORY:  HTN.  PAIN:  Are you having pain? Yes: NPRS scale: 4/10 Pain location: Left distal, lateral thigh, right low back/buttock region. Pain  description: Ache, sore, sharp and numb. Aggravating factors: "Comes and goes." Relieving factors: "Nothing."  PRECAUTIONS: None  RED FLAGS: None   WEIGHT BEARING RESTRICTIONS: No  FALLS:  Has patient fallen in last 6 months? No  LIVING ENVIRONMENT: Lives with: lives with their spouse Lives in: House/apartment Has following equipment at home: None  OCCUPATION: Jobs requires 8 hour shifts on concrete.  PLOF: Independent  PATIENT GOALS: Get out of pain.    OBJECTIVE:   DIAGNOSTIC FINDINGS:  12/07/22:  X-RAY FINDINGS: There is no evidence of lumbar spine fracture. Minimal curvature of spine. Intervertebral disc spaces are maintained. Minimal facet joint sclerosis noted in the lower lumbar spine.   IMPRESSION: Minimal degenerative joint changes of lower lumbar spine.  PATIENT SURVEYS:  FOTO 43.6.  POSTURE: No Significant postural limitations  PALPATION: Tender to palpation over right SIJ and upper gluteal musculature.  LUMBAR ROM:   Full active lumbar flexion and extension.  LOWER EXTREMITY ROM:     WNL.  LOWER EXTREMITY MMT:    Normal bilateral LE strength.  LUMBAR SPECIAL TESTS:  Equal leg lengths. (-) SLR and FABER testing.  Normal bilateral LE DTR's.   GAIT: WNL.  TODAY'S TREATMENT:                                                                                                                              DATE: HMP and IFC at 80-150 Hz  on 40% scan x 20 minutes to patient's right SIJ/upper gluteal musculature.   Normal modality response following removal of modality.   PATIENT EDUCATION:  Education details: See below. Person educated: Patient Education method: Explanation, Demonstration, and Handouts Education comprehension: verbalized understanding and returned demonstration  HOME EXERCISE PROGRAM: HOME EXERCISE PROGRAM Created by [place] Lalisa Kiehn Sep 19th, 2024 View at [email or web address] using code: [email or web address]  Page 1 of 1 3 Exercises SINGLE KNEE TO CHEST STRETCH - SKTC While Lying on your back, hold your knee and gently pull it up towards your chest. Repeat 3 Times Hold 30 Seconds Complete 1 Set Perform 3 Times a Day Double Knee to chest stretch Pull your knees up to your chest hold for 30 seconds then relax. Repeat Continue to breathe throughout stretch. Repeat 2 Times Hold 30 Seconds Complete 3 Sets Perform 2 Times a Day Bridges While laying on your back, contract the low abdominals and lift from the hips. Repeat 15 Times Hold 2 Seconds Complete 2 Sets Perform 2 Times a Day  ASSESSMENT:  CLINICAL IMPRESSION: The patient presents to OPPT with c/o left lateral distal thigh numbness (in region of distal ITB) and right-sided low back pain.  She is tender to palpation over her right SIJ and upper gluteal musculature.  She exhibits normal LE strength and and her DTR's are normal as well.  She exhibits normal active lumbar flexion and extension.  Special tests are negative.  An HEP was established today.  Patient will benefit from skilled physical therapy intervention  to address pain.  OBJECTIVE IMPAIRMENTS: increased muscle spasms and pain.   ACTIVITY LIMITATIONS: standing  PARTICIPATION LIMITATIONS: occupation  REHAB POTENTIAL: Excellent  CLINICAL DECISION MAKING: Stable/uncomplicated  EVALUATION COMPLEXITY: Low   GOALS:  SHORT TERM GOALS: Target date: 01/26/23  Ind with an intial  HEP. Baseline: Goal status: INITIAL     LONG TERM GOALS: Target date: 02/23/23  Ind with an advanced HEP.  Goal status: INITIAL  2.  Stand 30 minutes with pain not > 2/10.  Goal status: INITIAL  3.  Perform ADL's with pain not > 2/10.  Goal status: INITIAL PLAN:  PT FREQUENCY: 2x/week  PT DURATION: 6 weeks  PLANNED INTERVENTIONS: Therapeutic exercises, Therapeutic activity, Gait training, Patient/Family education, Self Care, Joint mobilization, Dry Needling, Electrical stimulation, Cryotherapy, Moist heat, Traction, Ultrasound, and Manual therapy.  PLAN FOR NEXT SESSION: Combo e'stim/US, STW/M, Core exercise progression, spinal protection techniques and body mechanics training.   Taariq Leitz, [place], PT 01/12/2023, 4:49 PM

## 2023-01-17 ENCOUNTER — Ambulatory Visit: Payer: BC Managed Care – PPO | Admitting: Physical Therapy

## 2023-01-17 DIAGNOSIS — M62830 Muscle spasm of back: Secondary | ICD-10-CM

## 2023-01-17 DIAGNOSIS — M5459 Other low back pain: Secondary | ICD-10-CM | POA: Diagnosis not present

## 2023-01-17 NOTE — Therapy (Signed)
OUTPATIENT PHYSICAL THERAPY THORACOLUMBAR EVALUATION   Patient Name: Kelly Jennings MRN: [id number] DOB:04-11-1981, 42 y.o., female Today's Date: 01/17/2023  END OF SESSION:  PT End of Session - 01/17/23 1732     Visit Number 2    Number of Visits 12    Date for PT Re-Evaluation 02/23/23    Authorization Type FOTO.    PT Start Time 0445    PT Stop Time 0540    PT Time Calculation (min) 55 min    Activity Tolerance Patient tolerated treatment well    Behavior During Therapy [facility] for tasks assessed/performed             Past Medical History:  Diagnosis Date   GERD (gastroesophageal reflux disease)    Hypertension    Migraines    Past Surgical History:  Procedure Laterality Date   CHOLECYSTECTOMY     Patient Active Problem List   Diagnosis Date Noted   Morbid obesity (HCC) 06/03/2022   Restless leg syndrome 11/16/2020   Anxiety 05/19/2020   Abdominal obesity and metabolic syndrome 05/19/2020   Prediabetes 01/29/2020   Depression, recurrent (HCC) 01/23/2020   Class 2 obesity due to excess calories without serious comorbidity with body mass index (BMI) of 39.0 to 39.9 in adult 02/19/2019   Essential hypertension, benign 02/19/2019   Vitamin D deficiency 02/19/2019   Vasodepressor syncope 06/12/2013   GERD (gastroesophageal reflux disease) 09/24/2012    REFERRING PROVIDER: Gilford Silvius  REFERRING DIAG: Left and right sided sciatica, muscle spasm.  Rationale for Evaluation and Treatment: Rehabilitation  THERAPY DIAG:  Other low back pain  Muscle spasm of back  ONSET DATE: July 2024.  SUBJECTIVE:                                                                                                                                                                                           SUBJECTIVE STATEMENT: Last treatment helped.  Been doing the exercises.  PERTINENT HISTORY:  HTN.  PAIN:  Are you having pain? Yes: NPRS scale: 2-3/10 Pain location: Left  distal, lateral thigh, right low back/buttock region. Pain description: Ache, sore, sharp and numb. Aggravating factors: "Comes and goes." Relieving factors: "Nothing."  PRECAUTIONS: None  RED FLAGS: None   WEIGHT BEARING RESTRICTIONS: No  FALLS:  Has patient fallen in last 6 months? No  LIVING ENVIRONMENT: Lives with: lives with their spouse Lives in: House/apartment Has following equipment at home: None  OCCUPATION: Jobs requires 8 hour shifts on concrete.  PLOF: Independent  PATIENT GOALS: Get out of pain.    OBJECTIVE:   DIAGNOSTIC FINDINGS:  12/07/22:  X-RAY FINDINGS: There  is no evidence of lumbar spine fracture. Minimal curvature of spine. Intervertebral disc spaces are maintained. Minimal facet joint sclerosis noted in the lower lumbar spine.   IMPRESSION: Minimal degenerative joint changes of lower lumbar spine.  PATIENT SURVEYS:  FOTO 43.6.  POSTURE: No Significant postural limitations  PALPATION: Tender to palpation over right SIJ and upper gluteal musculature.  LUMBAR ROM:   Full active lumbar flexion and extension.  LOWER EXTREMITY ROM:     WNL.  LOWER EXTREMITY MMT:    Normal bilateral LE strength.  LUMBAR SPECIAL TESTS:  Equal leg lengths. (-) SLR and FABER testing.  Normal bilateral LE DTR's.   GAIT: WNL.  TODAY'S TREATMENT:                                                                                                                              DATE: Patient in left SDLY position with folded pillow between  knees for comfort: Combo e'stim/US at 1.50 W/CM2 x 12 minutes f/b STW/M x 11 minutes f/b HMP and IFC at 80-150 Hz on 40% scan x 20 minutes to patient's right SIJ/upper gluteal musculature.   Normal modality response following removal of modality.   PATIENT EDUCATION:  Education details: See below. Person educated: Patient Education method: Explanation, Demonstration, and Handouts Education comprehension: verbalized  understanding and returned demonstration  HOME EXERCISE PROGRAM: HOME EXERCISE PROGRAM Created by [place] Joceline Hinchcliff Sep 19th, 2024 View at [email or web address] using code: [email or web address]  Page 1 of 1 3 Exercises SINGLE KNEE TO CHEST STRETCH - SKTC While Lying on your back, hold your knee and gently pull it up towards your chest. Repeat 3 Times Hold 30 Seconds Complete 1 Set Perform 3 Times a Day Double Knee to chest stretch Pull your knees up to your chest hold for 30 seconds then relax. Repeat Continue to breathe throughout stretch. Repeat 2 Times Hold 30 Seconds Complete 3 Sets Perform 2 Times a Day Bridges While laying on your back, contract the low abdominals and lift from the hips. Repeat 15 Times Hold 2 Seconds Complete 2 Sets Perform 2 Times a Day  ASSESSMENT:  CLINICAL IMPRESSION: Good response to first treatment with lowered pain-level today.  She continues to have palpable pain in the right SIJ region.  She had a very good response to treatment and felt better following.  OBJECTIVE IMPAIRMENTS: increased muscle spasms and pain.   ACTIVITY LIMITATIONS: standing  PARTICIPATION LIMITATIONS: occupation  REHAB POTENTIAL: Excellent  CLINICAL DECISION MAKING: Stable/uncomplicated  EVALUATION COMPLEXITY: Low   GOALS:  SHORT TERM GOALS: Target date: 01/26/23  Ind with an intial HEP. Baseline: Goal status: INITIAL     LONG TERM GOALS: Target date: 02/23/23  Ind with an advanced HEP.  Goal status: INITIAL  2.  Stand 30 minutes with pain not > 2/10.  Goal status: INITIAL  3.  Perform ADL's with pain not > 2/10.  Goal status: INITIAL PLAN:  PT FREQUENCY: 2x/week  PT DURATION:  6 weeks  PLANNED INTERVENTIONS: Therapeutic exercises, Therapeutic activity, Gait training, Patient/Family education, Self Care, Joint mobilization, Dry Needling, Electrical stimulation, Cryotherapy, Moist heat, Traction, Ultrasound, and Manual therapy.  PLAN FOR NEXT SESSION: Combo  e'stim/US, STW/M, Core exercise progression, spinal protection techniques and body mechanics training.   Montravious Weigelt, [place], PT 01/17/2023, 5:46 PM

## 2023-01-19 ENCOUNTER — Ambulatory Visit: Payer: BC Managed Care – PPO | Admitting: Physical Therapy

## 2023-01-19 DIAGNOSIS — M62830 Muscle spasm of back: Secondary | ICD-10-CM

## 2023-01-19 DIAGNOSIS — M5459 Other low back pain: Secondary | ICD-10-CM | POA: Diagnosis not present

## 2023-01-19 NOTE — Therapy (Signed)
OUTPATIENT PHYSICAL THERAPY THORACOLUMBAR EVALUATION   Patient Name: Kelly Jennings MRN: [id number] DOB:06-17-1980, 42 y.o., female Today's Date: 01/19/2023  END OF SESSION:  PT End of Session - 01/19/23 1733     Visit Number 3    Number of Visits 12    Date for PT Re-Evaluation 02/23/23    Authorization Type FOTO.    PT Start Time 0448    PT Stop Time 0541    PT Time Calculation (min) 53 min    Activity Tolerance Patient tolerated treatment well    Behavior During Therapy [facility] for tasks assessed/performed             Past Medical History:  Diagnosis Date   GERD (gastroesophageal reflux disease)    Hypertension    Migraines    Past Surgical History:  Procedure Laterality Date   CHOLECYSTECTOMY     Patient Active Problem List   Diagnosis Date Noted   Morbid obesity (HCC) 06/03/2022   Restless leg syndrome 11/16/2020   Anxiety 05/19/2020   Abdominal obesity and metabolic syndrome 05/19/2020   Prediabetes 01/29/2020   Depression, recurrent (HCC) 01/23/2020   Class 2 obesity due to excess calories without serious comorbidity with body mass index (BMI) of 39.0 to 39.9 in adult 02/19/2019   Essential hypertension, benign 02/19/2019   Vitamin D deficiency 02/19/2019   Vasodepressor syncope 06/12/2013   GERD (gastroesophageal reflux disease) 09/24/2012    REFERRING PROVIDER: Gilford Silvius  REFERRING DIAG: Left and right sided sciatica, muscle spasm.  Rationale for Evaluation and Treatment: Rehabilitation  THERAPY DIAG:  Other low back pain  Muscle spasm of back  ONSET DATE: July 2024.  SUBJECTIVE:                                                                                                                                                                                           SUBJECTIVE STATEMENT: Back flare-up some since last visit due to sitting on a folding chair for 4 hours and standing during a townhall meeting. PERTINENT HISTORY:  HTN.  PAIN:   Are you having pain? Yes: NPRS scale: 4-5/10 Pain location: Left distal, lateral thigh, right low back/buttock region. Pain description: Ache, sore, sharp and numb. Aggravating factors: "Comes and goes." Relieving factors: "Nothing."  PRECAUTIONS: None  RED FLAGS: None   WEIGHT BEARING RESTRICTIONS: No  FALLS:  Has patient fallen in last 6 months? No  LIVING ENVIRONMENT: Lives with: lives with their spouse Lives in: House/apartment Has following equipment at home: None  OCCUPATION: Jobs requires 8 hour shifts on concrete.  PLOF: Independent  PATIENT GOALS: Get out of pain.  OBJECTIVE:   DIAGNOSTIC FINDINGS:  12/07/22:  X-RAY FINDINGS: There is no evidence of lumbar spine fracture. Minimal curvature of spine. Intervertebral disc spaces are maintained. Minimal facet joint sclerosis noted in the lower lumbar spine.   IMPRESSION: Minimal degenerative joint changes of lower lumbar spine.  PATIENT SURVEYS:  FOTO 43.6.  POSTURE: No Significant postural limitations  PALPATION: Tender to palpation over right SIJ and upper gluteal musculature.  LUMBAR ROM:   Full active lumbar flexion and extension.  LOWER EXTREMITY ROM:     WNL.  LOWER EXTREMITY MMT:    Normal bilateral LE strength.  LUMBAR SPECIAL TESTS:  Equal leg lengths. (-) SLR and FABER testing.  Normal bilateral LE DTR's.   GAIT: WNL.  TODAY'S TREATMENT:                                                                                                                              DATE: 01/19/23:  Patient in left SDLY position with folded pillow between  knees for comfort: Combo e'stim/US at 1.50 W/CM2 x 12 minutes f/b STW/M x 11 minutes f/b HMP and IFC at 80-150 Hz on 40% scan x 20 minutes to patient's right SIJ/upper gluteal musculature.   Normal modality response following removal of modality.   PATIENT EDUCATION:  Education details: See below. Person educated: Patient Education method:  Explanation, Demonstration, and Handouts Education comprehension: verbalized understanding and returned demonstration  HOME EXERCISE PROGRAM: HOME EXERCISE PROGRAM Created by [place] Ora Bollig Sep 19th, 2024 View at [email or web address] using code: [email or web address]  Page 1 of 1 3 Exercises SINGLE KNEE TO CHEST STRETCH - SKTC While Lying on your back, hold your knee and gently pull it up towards your chest. Repeat 3 Times Hold 30 Seconds Complete 1 Set Perform 3 Times a Day Double Knee to chest stretch Pull your knees up to your chest hold for 30 seconds then relax. Repeat Continue to breathe throughout stretch. Repeat 2 Times Hold 30 Seconds Complete 3 Sets Perform 2 Times a Day Bridges While laying on your back, contract the low abdominals and lift from the hips. Repeat 15 Times Hold 2 Seconds Complete 2 Sets Perform 2 Times a Day  ASSESSMENT:  CLINICAL IMPRESSION: Patient with increased pain today due to sitting several hours on a folding chair and standing for an extended period of time.  She had a good response to treatment and felt better following.  Her right SIJ and upper glut musculature remains tender and numbness over her left distal/lateral thigh.  OBJECTIVE IMPAIRMENTS: increased muscle spasms and pain.   ACTIVITY LIMITATIONS: standing  PARTICIPATION LIMITATIONS: occupation  REHAB POTENTIAL: Excellent  CLINICAL DECISION MAKING: Stable/uncomplicated  EVALUATION COMPLEXITY: Low   GOALS:  SHORT TERM GOALS: Target date: 01/26/23  Ind with an intial HEP. Baseline: Goal status: INITIAL     LONG TERM GOALS: Target date: 02/23/23  Ind with an advanced HEP.  Goal status: INITIAL  2.  Stand 30 minutes with  pain not > 2/10.  Goal status: INITIAL  3.  Perform ADL's with pain not > 2/10.  Goal status: INITIAL PLAN:  PT FREQUENCY: 2x/week  PT DURATION: 6 weeks  PLANNED INTERVENTIONS: Therapeutic exercises, Therapeutic activity, Gait training, Patient/Family  education, Self Care, Joint mobilization, Dry Needling, Electrical stimulation, Cryotherapy, Moist heat, Traction, Ultrasound, and Manual therapy.  PLAN FOR NEXT SESSION: Combo e'stim/US, STW/M, Core exercise progression, spinal protection techniques and body mechanics training.   Glorianne Proctor, [place], PT 01/19/2023, 5:52 PM

## 2023-01-23 ENCOUNTER — Other Ambulatory Visit: Payer: Self-pay | Admitting: Family Medicine

## 2023-01-23 DIAGNOSIS — F419 Anxiety disorder, unspecified: Secondary | ICD-10-CM

## 2023-01-23 DIAGNOSIS — F339 Major depressive disorder, recurrent, unspecified: Secondary | ICD-10-CM

## 2023-01-24 ENCOUNTER — Ambulatory Visit: Payer: BC Managed Care – PPO | Admitting: Physical Therapy

## 2023-01-26 ENCOUNTER — Ambulatory Visit: Payer: BC Managed Care – PPO | Attending: Family Medicine | Admitting: Physical Therapy

## 2023-01-26 DIAGNOSIS — M62830 Muscle spasm of back: Secondary | ICD-10-CM | POA: Insufficient documentation

## 2023-01-26 DIAGNOSIS — M5459 Other low back pain: Secondary | ICD-10-CM | POA: Diagnosis present

## 2023-01-26 NOTE — Therapy (Signed)
OUTPATIENT PHYSICAL THERAPY THORACOLUMBAR EVALUATION   Patient Name: Kelly Jennings MRN: [id number] DOB:[birth year], 42 y.o., female Today's Date: 01/26/2023  END OF SESSION:  PT End of Session - 01/26/23 1752     Visit Number 4    Number of Visits 12    Date for PT Re-Evaluation 02/23/23    Authorization Type FOTO.    PT Start Time 0445    PT Stop Time 0541    PT Time Calculation (min) 56 min    Activity Tolerance Patient tolerated treatment well    Behavior During Therapy [facility] for tasks assessed/performed              Past Medical History:  Diagnosis Date   GERD (gastroesophageal reflux disease)    Hypertension    Migraines    Past Surgical History:  Procedure Laterality Date   CHOLECYSTECTOMY     Patient Active Problem List   Diagnosis Date Noted   Morbid obesity (HCC) 06/03/2022   Restless leg syndrome 11/16/2020   Anxiety 05/19/2020   Abdominal obesity and metabolic syndrome 05/19/2020   Prediabetes 01/29/2020   Depression, recurrent (HCC) 01/23/2020   Class 2 obesity due to excess calories without serious comorbidity with body mass index (BMI) of 39.0 to 39.9 in adult 02/19/2019   Essential hypertension, benign 02/19/2019   Vitamin D deficiency 02/19/2019   Vasodepressor syncope 06/12/2013   GERD (gastroesophageal reflux disease) 09/24/2012    REFERRING PROVIDER: Gilford Silvius  REFERRING DIAG: Left and right sided sciatica, muscle spasm.  Rationale for Evaluation and Treatment: Rehabilitation  THERAPY DIAG:  Other low back pain  Muscle spasm of back  ONSET DATE: July 2024.  SUBJECTIVE:                                                                                                                                                                                           SUBJECTIVE STATEMENT: Back is doing better.  PERTINENT HISTORY:  HTN.  PAIN:  Are you having pain? Yes: NPRS scale: 3/10 Pain location: Left distal, lateral thigh, right low  back/buttock region. Pain description: Ache, sore, sharp and numb. Aggravating factors: "Comes and goes." Relieving factors: "Nothing."  PRECAUTIONS: None  RED FLAGS: None   WEIGHT BEARING RESTRICTIONS: No  FALLS:  Has patient fallen in last 6 months? No  LIVING ENVIRONMENT: Lives with: lives with their spouse Lives in: House/apartment Has following equipment at home: None  OCCUPATION: Jobs requires 8 hour shifts on concrete.  PLOF: Independent  PATIENT GOALS: Get out of pain.    OBJECTIVE:   DIAGNOSTIC FINDINGS:  12/07/22:  X-RAY FINDINGS: There is no evidence  of lumbar spine fracture. Minimal curvature of spine. Intervertebral disc spaces are maintained. Minimal facet joint sclerosis noted in the lower lumbar spine.   IMPRESSION: Minimal degenerative joint changes of lower lumbar spine.  PATIENT SURVEYS:  FOTO 43.6.  POSTURE: No Significant postural limitations  PALPATION: Tender to palpation over right SIJ and upper gluteal musculature.  LUMBAR ROM:   Full active lumbar flexion and extension.  LOWER EXTREMITY ROM:     WNL.  LOWER EXTREMITY MMT:    Normal bilateral LE strength.  LUMBAR SPECIAL TESTS:  Equal leg lengths. (-) SLR and FABER testing.  Normal bilateral LE DTR's.   GAIT: WNL.  TODAY'S TREATMENT:                                                                                                                              DATE:   01/26/23:                                   EXERCISE LOG  Exercise Repetitions and Resistance Comments  Nustep Level 5 x 10 minutes   Back ext  60# x 3 minutes.   Ab curls 60# x 3 minutes           Patient in left SDLY position with folded pillow between  knees for comfort: STW/M x 7 minutes to patient's left SIJ upper gluteal region f/b HMP and IFC at 80-150 Hz on 40% scan x 20 minutes to patient's right SIJ/upper gluteal musculature.   Normal modality response following removal of  modality.     01/19/23:  Patient in left SDLY position with folded pillow between  knees for comfort: Combo e'stim/US at 1.50 W/CM2 x 12 minutes f/b STW/M x 11 minutes f/b HMP and IFC at 80-150 Hz on 40% scan x 20 minutes to patient's right SIJ/upper gluteal musculature.   Normal modality response following removal of modality.   PATIENT EDUCATION:  Education details: See below. Person educated: Patient Education method: Explanation, Demonstration, and Handouts Education comprehension: verbalized understanding and returned demonstration  HOME EXERCISE PROGRAM: HOME EXERCISE PROGRAM Created by [place] Kailana Benninger Sep 19th, 2024 View at [email or web address] using code: [email or web address]  Page 1 of 1 3 Exercises SINGLE KNEE TO CHEST STRETCH - SKTC While Lying on your back, hold your knee and gently pull it up towards your chest. Repeat 3 Times Hold 30 Seconds Complete 1 Set Perform 3 Times a Day Double Knee to chest stretch Pull your knees up to your chest hold for 30 seconds then relax. Repeat Continue to breathe throughout stretch. Repeat 2 Times Hold 30 Seconds Complete 3 Sets Perform 2 Times a Day Bridges While laying on your back, contract the low abdominals and lift from the hips. Repeat 15 Times Hold 2 Seconds Complete 2 Sets Perform 2 Times a Day  ASSESSMENT:  CLINICAL IMPRESSION: Patient with a lowered pain-level upon presentation  to the clinic today.  She did very well with the addition of the Nustep and back and ab machine.  She fely better after treatment.  OBJECTIVE IMPAIRMENTS: increased muscle spasms and pain.   ACTIVITY LIMITATIONS: standing  PARTICIPATION LIMITATIONS: occupation  REHAB POTENTIAL: Excellent  CLINICAL DECISION MAKING: Stable/uncomplicated  EVALUATION COMPLEXITY: Low   GOALS:  SHORT TERM GOALS: Target date: 01/26/23  Ind with an intial HEP. Baseline: Goal status: INITIAL     LONG TERM GOALS: Target date: 02/23/23  Ind with an  advanced HEP.  Goal status: INITIAL  2.  Stand 30 minutes with pain not > 2/10.  Goal status: INITIAL  3.  Perform ADL's with pain not > 2/10.  Goal status: INITIAL PLAN:  PT FREQUENCY: 2x/week  PT DURATION: 6 weeks  PLANNED INTERVENTIONS: Therapeutic exercises, Therapeutic activity, Gait training, Patient/Family education, Self Care, Joint mobilization, Dry Needling, Electrical stimulation, Cryotherapy, Moist heat, Traction, Ultrasound, and Manual therapy.  PLAN FOR NEXT SESSION: Combo e'stim/US, STW/M, Core exercise progression, spinal protection techniques and body mechanics training.   Raef Sprigg, [place], PT 01/26/2023, 6:06 PM

## 2023-02-02 ENCOUNTER — Encounter: Payer: BC Managed Care – PPO | Admitting: Physical Therapy

## 2023-02-07 ENCOUNTER — Ambulatory Visit: Payer: BC Managed Care – PPO | Admitting: Physical Therapy

## 2023-02-09 ENCOUNTER — Encounter: Payer: BC Managed Care – PPO | Admitting: Physical Therapy

## 2023-02-14 ENCOUNTER — Encounter: Payer: BC Managed Care – PPO | Admitting: Physical Therapy

## 2023-02-16 ENCOUNTER — Encounter: Payer: BC Managed Care – PPO | Admitting: Physical Therapy

## 2023-02-21 ENCOUNTER — Encounter: Payer: BC Managed Care – PPO | Admitting: Physical Therapy

## 2023-02-23 ENCOUNTER — Encounter: Payer: BC Managed Care – PPO | Admitting: Physical Therapy

## 2023-03-10 ENCOUNTER — Ambulatory Visit (INDEPENDENT_AMBULATORY_CARE_PROVIDER_SITE_OTHER): Payer: BC Managed Care – PPO | Admitting: Family Medicine

## 2023-03-10 ENCOUNTER — Encounter: Payer: Self-pay | Admitting: Family Medicine

## 2023-03-10 DIAGNOSIS — E559 Vitamin D deficiency, unspecified: Secondary | ICD-10-CM | POA: Diagnosis not present

## 2023-03-10 DIAGNOSIS — F339 Major depressive disorder, recurrent, unspecified: Secondary | ICD-10-CM

## 2023-03-10 DIAGNOSIS — I10 Essential (primary) hypertension: Secondary | ICD-10-CM | POA: Diagnosis not present

## 2023-03-10 NOTE — Patient Instructions (Signed)

## 2023-03-10 NOTE — Progress Notes (Signed)
Subjective:  Patient ID: Kelly Jennings, female    DOB: 03/01/1981, 42 y.o.   MRN: [id number]  Patient Care Team: Sonny Masters, FNP as PCP - General (Family Medicine) Maisie Fus, MD as PCP - Cardiology (Cardiology)   Chief Complaint:  BMI (3 month follow up )   HPI: Kelly Jennings is a 42 y.o. female presenting on 03/10/2023 for BMI (3 month follow up )   Discussed the use of AI scribe software for clinical note transcription with the patient, who gave verbal consent to proceed.  History of Present Illness   The patient, currently on semaglutide for weight management, has been tolerating the medication well, with the exception of transient nausea lasting about two days each time the dose is increased. They are currently on a dose of 1.7mg  and are due to increase to 2.4mg  soon. The patient has been making lifestyle changes to aid in weight loss, including discontinuing soft drinks and sweet tea, and incorporating exercise into their routine, such as walking on a treadmill at [employer]. These efforts have resulted in a weight loss of 30 pounds since the start of the treatment, from 242 pounds to 212 pounds.  In addition to weight management, the patient is also being treated for depression with fluoxetine, and hypertension with lisinopril 40mg  and Lasix as needed. The patient reports no new symptoms or issues related to these conditions.  The patient also reports a history of leg discomfort, which seems to have improved with weight loss. The patient is managing this discomfort without additional medical intervention.          Relevant past medical, surgical, family, and social history reviewed and updated as indicated.  Allergies and medications reviewed and updated. Data reviewed: Chart in Epic.   Past Medical History:  Diagnosis Date   GERD (gastroesophageal reflux disease)    Hypertension    Migraines     Past Surgical History:  Procedure Laterality Date    CHOLECYSTECTOMY      Social History   Socioeconomic History   Marital status: Married    Spouse name: Not on file   Number of children: 2   Years of education: Not on file   Highest education level: Not on file  Occupational History   Not on file  Tobacco Use   Smoking status: Never   Smokeless tobacco: Never  Vaping Use   Vaping status: Never Used  Substance and Sexual Activity   Alcohol use: No   Drug use: No   Sexual activity: Yes  Other Topics Concern   Not on file  Social History Narrative   Not on file   Social Determinants of Health   Financial Resource Strain: Not on file  Food Insecurity: Not on file  Transportation Needs: Not on file  Physical Activity: Not on file  Stress: Not on file  Social Connections: Not on file  Intimate Partner Violence: Not on file    Outpatient Encounter Medications as of 03/10/2023  Medication Sig   cyclobenzaprine (FLEXERIL) 5 MG tablet Take 1 tablet (5 mg total) by mouth 3 (three) times daily as needed for muscle spasms.   FLUoxetine (PROZAC) 20 MG capsule TAKE 1 CAPSULE BY MOUTH EVERY DAY   furosemide (LASIX) 20 MG tablet TAKE 1-2 TABLETS (20-40 MG TOTAL) BY MOUTH DAILY.   lisinopril (ZESTRIL) 40 MG tablet Take 1 tablet (40 mg total) by mouth daily.   ondansetron (ZOFRAN-ODT) 4 MG disintegrating tablet Take  1 tablet (4 mg total) by mouth every 8 (eight) hours as needed for nausea or vomiting.   pantoprazole (PROTONIX) 20 MG tablet TAKE 1 TABLET BY MOUTH EVERY DAY   Semaglutide-Weight Management 2.4 MG/0.75ML SOAJ Inject 2.4 mg into the skin once a week for 28 days. (Patient not taking: Reported on 01/12/2023)   No facility-administered encounter medications on file as of 03/10/2023.    Allergies  Allergen Reactions   Penicillins Rash    Rash as an infant    Pertinent ROS per HPI, otherwise unremarkable      Objective:  BP 115/82   Pulse 67   Temp (!) 97 F (36.1 C) (Temporal)   Ht 5\' 2"  (1.575 m)   Wt 212 lb  12.8 oz (96.5 kg)   SpO2 97%   BMI 38.92 kg/m    Wt Readings from Last 3 Encounters:  03/10/23 212 lb 12.8 oz (96.5 kg)  01/03/23 220 lb 12.8 oz (100.2 kg)  12/07/22 222 lb 12.8 oz (101.1 kg)    Physical Exam Vitals and nursing note reviewed.  Constitutional:      General: She is not in acute distress.    Appearance: Normal appearance. She is obese. She is not ill-appearing, toxic-appearing or diaphoretic.  HENT:     Head: Normocephalic and atraumatic.     Nose: Nose normal.     Mouth/Throat:     Mouth: Mucous membranes are moist.  Eyes:     Conjunctiva/sclera: Conjunctivae normal.     Pupils: Pupils are equal, round, and reactive to light.  Cardiovascular:     Rate and Rhythm: Normal rate and regular rhythm.     Heart sounds: Normal heart sounds. No murmur heard.    No friction rub. No gallop.  Pulmonary:     Effort: Pulmonary effort is normal.     Breath sounds: Normal breath sounds.  Abdominal:     General: Bowel sounds are normal.     Palpations: Abdomen is soft.  Musculoskeletal:     Right lower leg: No edema.     Left lower leg: No edema.  Skin:    General: Skin is warm and dry.     Capillary Refill: Capillary refill takes less than 2 seconds.  Neurological:     General: No focal deficit present.     Mental Status: She is alert and oriented to person, place, and time.  Psychiatric:        Mood and Affect: Mood normal.        Behavior: Behavior normal.        Thought Content: Thought content normal.        Judgment: Judgment normal.    Physical Exam   MEASUREMENTS: WT- 212        Results for orders placed or performed in visit on 12/07/22  CMP14+EGFR  Result Value Ref Range   Glucose 95 70 - 99 mg/dL   BUN 12 6 - 24 mg/dL   Creatinine, Ser 4.09 0.57 - 1.00 mg/dL   eGFR 83 >81 XB/JYN/8.29   BUN/Creatinine Ratio 13 9 - 23   Sodium 139 134 - 144 mmol/L   Potassium 4.4 3.5 - 5.2 mmol/L   Chloride 104 96 - 106 mmol/L   CO2 22 20 - 29 mmol/L   Calcium  9.4 8.7 - 10.2 mg/dL   Total Protein 6.8 6.0 - 8.5 g/dL   Albumin 4.4 3.9 - 4.9 g/dL   Globulin, Total 2.4 1.5 - 4.5 g/dL  Bilirubin Total 0.8 0.0 - 1.2 mg/dL   Alkaline Phosphatase 84 44 - 121 IU/L   AST 24 0 - 40 IU/L   ALT 27 0 - 32 IU/L  CBC with Differential/Platelet  Result Value Ref Range   WBC 8.1 3.4 - 10.8 x10E3/uL   RBC 4.19 3.77 - 5.28 x10E6/uL   Hemoglobin 12.8 11.1 - 15.9 g/dL   Hematocrit 29.5 62.1 - 46.6 %   MCV 91 79 - 97 fL   MCH 30.5 26.6 - 33.0 pg   MCHC 33.4 31.5 - 35.7 g/dL   RDW 30.8 65.7 - 84.6 %   Platelets 263 150 - 450 x10E3/uL   Neutrophils 65 Not Estab. %   Lymphs 24 Not Estab. %   Monocytes 9 Not Estab. %   Eos 2 Not Estab. %   Basos 0 Not Estab. %   Neutrophils Absolute 5.2 1.4 - 7.0 x10E3/uL   Lymphocytes Absolute 2.0 0.7 - 3.1 x10E3/uL   Monocytes Absolute 0.7 0.1 - 0.9 x10E3/uL   EOS (ABSOLUTE) 0.2 0.0 - 0.4 x10E3/uL   Basophils Absolute 0.0 0.0 - 0.2 x10E3/uL   Immature Granulocytes 0 Not Estab. %   Immature Grans (Abs) 0.0 0.0 - 0.1 x10E3/uL  Thyroid Panel With TSH  Result Value Ref Range   TSH 0.870 0.450 - 4.500 uIU/mL   T4, Total 8.6 4.5 - 12.0 ug/dL   T3 Uptake Ratio 27 24 - 39 %   Free Thyroxine Index 2.3 1.2 - 4.9  Lipid panel  Result Value Ref Range   Cholesterol, Total 211 (H) 100 - 199 mg/dL   Triglycerides 962 (H) 0 - 149 mg/dL   HDL 40 >95 mg/dL   VLDL Cholesterol Cal 34 5 - 40 mg/dL   LDL Chol Calc (NIH) 284 (H) 0 - 99 mg/dL   Chol/HDL Ratio 5.3 (H) 0.0 - 4.4 ratio  VITAMIN D 25 Hydroxy (Vit-D Deficiency, Fractures)  Result Value Ref Range   Vit D, 25-Hydroxy 65.2 30.0 - 100.0 ng/mL       Pertinent labs & imaging results that were available during my care of the patient were reviewed by me and considered in my medical decision making.  Assessment & Plan:  Janiesha was seen today for bmi.  Diagnoses and all orders for this visit:  Morbid obesity (HCC) -     CMP14+EGFR -     CBC with Differential/Platelet -      Lipid panel -     Thyroid Panel With TSH -     VITAMIN D 25 Hydroxy (Vit-D Deficiency, Fractures)  Vitamin D deficiency -     VITAMIN D 25 Hydroxy (Vit-D Deficiency, Fractures)  Essential hypertension, benign -     CMP14+EGFR -     CBC with Differential/Platelet -     Lipid panel -     Thyroid Panel With TSH  Depression, recurrent (HCC) -     Thyroid Panel With TSH -     VITAMIN D 25 Hydroxy (Vit-D Deficiency, Fractures)     Assessment and Plan    Obesity Currently on semaglutide 1.7 mg with significant weight loss from 242 lbs to 212 lbs (30 lbs). No major side effects except for transient nausea with dose increases. Made dietary changes (quitting soft drinks and sweet tea) and exercises intermittently due to a busy schedule. Discussed hydration and small frequent meals to mitigate nausea and reflux. Labs to be checked for kidney and thyroid function. - Increase semaglutide to 2.4 mg -  Encourage increased water intake - Advise small frequent meals and avoidance of heavy foods - Check kidney and thyroid function labs - Schedule follow-up in January or February for a complete physical  Hypertension On lisinopril 40 mg and Lasix PRN. Blood pressure well-controlled. Potential to reduce lisinopril dose if weight loss continues. Discussed reducing lisinopril to 20 mg and minimizing Lasix use as weight loss progresses. - Monitor blood pressure - Consider reducing lisinopril to 20 mg if weight loss and blood pressure trends continue - Minimize Lasix use as weight loss progresses  Depression On fluoxetine with well-managed mood. No new symptoms or concerns reported. - Continue fluoxetine - Refill fluoxetine as needed  General Health Maintenance Advised on hydration, dietary habits, and regular exercise in conjunction with GLP-1 therapy. - Encourage increased water intake - Advise small frequent meals and avoidance of heavy foods - Encourage regular exercise  Follow-up - Check  kidney and thyroid function labs - Schedule follow-up in January or February for a complete physical - Refill semaglutide 2.4 mg as needed - Refill fluoxetine as needed.          Continue all other maintenance medications.  Follow up plan: Return in about 3 months (around 06/10/2023), or if symptoms worsen or fail to improve, for CPE.   Continue healthy lifestyle choices, including diet (rich in fruits, vegetables, and lean proteins, and low in salt and simple carbohydrates) and exercise (at least 30 minutes of moderate physical activity daily).  Educational handout given for GLP-1 tips  The above assessment and management plan was discussed with the patient. The patient verbalized understanding of and has agreed to the management plan. Patient is aware to call the clinic if they develop any new symptoms or if symptoms persist or worsen. Patient is aware when to return to the clinic for a follow-up visit. Patient educated on when it is appropriate to go to the emergency department.   Kari Baars, FNP-C [facility] [phone]

## 2023-03-11 LAB — CBC WITH DIFFERENTIAL/PLATELET
Basophils Absolute: 0.1 10*3/uL (ref 0.0–0.2)
Basos: 1 %
EOS (ABSOLUTE): 0.2 10*3/uL (ref 0.0–0.4)
Eos: 2 %
Hematocrit: 40.7 % (ref 34.0–46.6)
Hemoglobin: 13.6 g/dL (ref 11.1–15.9)
Immature Grans (Abs): 0 10*3/uL (ref 0.0–0.1)
Immature Granulocytes: 0 %
Lymphocytes Absolute: 2.1 10*3/uL (ref 0.7–3.1)
Lymphs: 26 %
MCH: 31 pg (ref 26.6–33.0)
MCHC: 33.4 g/dL (ref 31.5–35.7)
MCV: 93 fL (ref 79–97)
Monocytes Absolute: 0.7 10*3/uL (ref 0.1–0.9)
Monocytes: 8 %
Neutrophils Absolute: 5.1 10*3/uL (ref 1.4–7.0)
Neutrophils: 63 %
Platelets: 285 10*3/uL (ref 150–450)
RBC: 4.39 x10E6/uL (ref 3.77–5.28)
RDW: 12.8 % (ref 11.7–15.4)
WBC: 8 10*3/uL (ref 3.4–10.8)

## 2023-03-11 LAB — CMP14+EGFR
ALT: 30 IU/L (ref 0–32)
AST: 20 IU/L (ref 0–40)
Albumin: 4.6 g/dL (ref 3.9–4.9)
Alkaline Phosphatase: 92 [IU]/L (ref 44–121)
BUN/Creatinine Ratio: 10 (ref 9–23)
BUN: 9 mg/dL (ref 6–24)
Bilirubin Total: 0.5 mg/dL (ref 0.0–1.2)
CO2: 17 mmol/L — ABNORMAL LOW (ref 20–29)
Calcium: 9.7 mg/dL (ref 8.7–10.2)
Chloride: 104 mmol/L (ref 96–106)
Creatinine, Ser: 0.89 mg/dL (ref 0.57–1.00)
Globulin, Total: 2.4 g/dL (ref 1.5–4.5)
Glucose: 104 mg/dL — ABNORMAL HIGH (ref 70–99)
Potassium: 4.8 mmol/L (ref 3.5–5.2)
Sodium: 141 mmol/L (ref 134–144)
Total Protein: 7 g/dL (ref 6.0–8.5)
eGFR: 82 mL/min/{1.73_m2} (ref >59)

## 2023-03-11 LAB — LIPID PANEL
Chol/HDL Ratio: 4.4 ratio (ref 0.0–4.4)
Cholesterol, Total: 191 mg/dL (ref 100–199)
HDL: 43 mg/dL (ref >39)
LDL Chol Calc (NIH): 127 mg/dL — ABNORMAL HIGH (ref 0–99)
Triglycerides: 115 mg/dL (ref 0–149)
VLDL Cholesterol Cal: 21 mg/dL (ref 5–40)

## 2023-03-11 LAB — VITAMIN D 25 HYDROXY (VIT D DEFICIENCY, FRACTURES): Vit D, 25-Hydroxy: 55.8 ng/mL (ref 30.0–100.0)

## 2023-03-11 LAB — THYROID PANEL WITH TSH
Free Thyroxine Index: 2.4 (ref 1.2–4.9)
T3 Uptake Ratio: 25 % (ref 24–39)
T4, Total: 9.7 ug/dL (ref 4.5–12.0)
TSH: 0.691 u[IU]/mL (ref 0.450–4.500)

## 2023-03-17 ENCOUNTER — Other Ambulatory Visit: Payer: Self-pay | Admitting: Family Medicine

## 2023-03-17 ENCOUNTER — Encounter: Payer: Self-pay | Admitting: Family Medicine

## 2023-03-17 MED ORDER — SEMAGLUTIDE-WEIGHT MANAGEMENT 2.4 MG/0.75ML ~~LOC~~ SOAJ: 2.4000 mg | SUBCUTANEOUS | 0 refills | Status: AC

## 2023-03-17 NOTE — Addendum Note (Signed)
Addended by: Sonny Masters on: 03/17/2023 02:03 PM   Modules accepted: Orders

## 2023-05-21 ENCOUNTER — Other Ambulatory Visit: Payer: Self-pay | Admitting: Family Medicine

## 2023-05-21 DIAGNOSIS — I10 Essential (primary) hypertension: Secondary | ICD-10-CM

## 2023-05-21 DIAGNOSIS — K2100 Gastro-esophageal reflux disease with esophagitis, without bleeding: Secondary | ICD-10-CM

## 2023-06-12 ENCOUNTER — Telehealth (HOSPITAL_COMMUNITY): Payer: Self-pay | Admitting: Pharmacy Technician

## 2023-06-12 ENCOUNTER — Other Ambulatory Visit (HOSPITAL_COMMUNITY): Payer: Self-pay

## 2023-06-12 NOTE — Telephone Encounter (Signed)
 Pharmacy Patient Advocate Encounter   Received notification from CoverMyMeds that prior authorization for [facility] 2.4MG /0.75ML PEN is required/requested.   Insurance verification completed.   The patient is insured through [employer] [facility] .   Per test claim: PA required; PA submitted to above mentioned insurance via CoverMyMeds Key/confirmation #/EOC [id number] Status is pending

## 2023-06-12 NOTE — Telephone Encounter (Signed)
 Pharmacy Patient Advocate Encounter  Received notification from [facility] that Prior Authorization for [facility] 2.4MG /0.75ML PEN has been APPROVED from 06/12/2023 to 06/11/2024. Unable to obtain price due to refill too soon rejection, last fill date 06/11/2022 next available fill date03/01/2024   PA #/Case ID/Reference #: [id number]

## 2023-06-16 ENCOUNTER — Other Ambulatory Visit: Payer: Self-pay | Admitting: Family Medicine

## 2023-06-16 DIAGNOSIS — F339 Major depressive disorder, recurrent, unspecified: Secondary | ICD-10-CM

## 2023-06-16 DIAGNOSIS — F419 Anxiety disorder, unspecified: Secondary | ICD-10-CM

## 2023-07-08 ENCOUNTER — Other Ambulatory Visit: Payer: Self-pay | Admitting: Family Medicine

## 2023-07-09 ENCOUNTER — Encounter: Payer: Self-pay | Admitting: Family Medicine

## 2023-07-14 ENCOUNTER — Ambulatory Visit (INDEPENDENT_AMBULATORY_CARE_PROVIDER_SITE_OTHER): Payer: BC Managed Care – PPO | Admitting: Family Medicine

## 2023-07-14 ENCOUNTER — Encounter: Payer: Self-pay | Admitting: Family Medicine

## 2023-07-14 ENCOUNTER — Other Ambulatory Visit (HOSPITAL_COMMUNITY)
Admission: RE | Admit: 2023-07-14 | Discharge: 2023-07-14 | Disposition: A | Discharge status: Home / Self Care | Source: Ambulatory Visit | Attending: Family Medicine | Admitting: Family Medicine

## 2023-07-14 VITALS — BP 127/87 | HR 65 | Temp 97.3°F | Ht 62.0 in | Wt 199.4 lb

## 2023-07-14 DIAGNOSIS — F419 Anxiety disorder, unspecified: Secondary | ICD-10-CM | POA: Diagnosis not present

## 2023-07-14 DIAGNOSIS — Z124 Encounter for screening for malignant neoplasm of cervix: Secondary | ICD-10-CM

## 2023-07-14 DIAGNOSIS — Z0000 Encounter for general adult medical examination without abnormal findings: Secondary | ICD-10-CM

## 2023-07-14 DIAGNOSIS — Z79899 Other long term (current) drug therapy: Secondary | ICD-10-CM

## 2023-07-14 DIAGNOSIS — I10 Essential (primary) hypertension: Secondary | ICD-10-CM | POA: Diagnosis not present

## 2023-07-14 DIAGNOSIS — R7303 Prediabetes: Secondary | ICD-10-CM | POA: Diagnosis not present

## 2023-07-14 DIAGNOSIS — Z1231 Encounter for screening mammogram for malignant neoplasm of breast: Secondary | ICD-10-CM

## 2023-07-14 DIAGNOSIS — F339 Major depressive disorder, recurrent, unspecified: Secondary | ICD-10-CM

## 2023-07-14 DIAGNOSIS — Z0001 Encounter for general adult medical examination with abnormal findings: Secondary | ICD-10-CM

## 2023-07-14 DIAGNOSIS — K2100 Gastro-esophageal reflux disease with esophagitis, without bleeding: Secondary | ICD-10-CM | POA: Diagnosis not present

## 2023-07-14 DIAGNOSIS — R110 Nausea: Secondary | ICD-10-CM

## 2023-07-14 DIAGNOSIS — Z136 Encounter for screening for cardiovascular disorders: Secondary | ICD-10-CM

## 2023-07-14 LAB — BAYER DCA HB A1C WAIVED: HB A1C (BAYER DCA - WAIVED): 5.2 % (ref 4.8–5.6)

## 2023-07-14 MED ORDER — PANTOPRAZOLE SODIUM 20 MG PO TBEC: 20.0000 mg | DELAYED_RELEASE_TABLET | Freq: Every day | ORAL | 1 refills | Status: DC

## 2023-07-14 MED ORDER — ONDANSETRON 4 MG PO TBDP: 4.0000 mg | ORAL_TABLET | Freq: Three times a day (TID) | ORAL | 0 refills | Status: DC | PRN

## 2023-07-14 MED ORDER — HYDROXYZINE HCL 10 MG PO TABS
10.0000 mg | ORAL_TABLET | Freq: Three times a day (TID) | ORAL | 3 refills | Status: DC | PRN
Start: 2023-07-14 — End: 2023-07-24

## 2023-07-14 MED ORDER — FUROSEMIDE 20 MG PO TABS: 20.0000 mg | ORAL_TABLET | Freq: Every day | ORAL | 1 refills | Status: DC

## 2023-07-14 MED ORDER — LISINOPRIL 40 MG PO TABS: 40.0000 mg | ORAL_TABLET | Freq: Every day | ORAL | 1 refills | Status: DC

## 2023-07-14 MED ORDER — FLUOXETINE HCL 20 MG PO CAPS: 40.0000 mg | ORAL_CAPSULE | Freq: Every day | ORAL | 1 refills | Status: DC

## 2023-07-14 NOTE — Progress Notes (Addendum)
 Complete physical exam  Patient: Kelly Jennings   DOB: 07-Nov-1980   43 y.o. Female  MRN: [id number]  Subjective:    Chief Complaint  Patient presents with   Annual Exam   Gynecologic Exam    Kelly Jennings is a 43 y.o. female who presents today for a complete physical exam. She reports consuming a general diet. The patient has a physically strenuous job, but has no regular exercise apart from work.  She generally feels poorly. She reports sleeping fairly well. She does have additional problems to discuss today.    History of Present Illness   Kelly Jennings is a 43 year old female who presents with anxiety and depression symptoms.  She experiences anxiety and depression symptoms, which she attributes to recent changes at work, including layoffs and increased workload. She feels unable to maintain composure, has difficulty concentrating, cries frequently, feels hot, experiences heart palpitations, and has cramps behind her breastbone.  She has been taking fluoxetine 20 mg daily but feels it is not effective enough, as she has experienced panic attacks and has sent multiple messages requesting an increase in dosage.  She has not taken [facility] for about a month, as she felt the 2.5 mg dose was too much, coinciding with a stressful period in her life. She experienced significant nausea during this time and is out of her nausea medication, which she used as needed, approximately twice a week.  No changes in eating and drinking habits, hair, skin, or nails, and no changes in bowel habits except for diarrhea every morning, which she associates with anxiety. No changes in hearing and she confirms regular visits to the eye doctor and dentist without issues. She engages in regular exercise.      Most recent fall risk assessment:    07/14/2023    8:11 AM  Fall Risk   Falls in the past year? 0  Risk for fall due to : No Fall Risks  Follow up Falls evaluation completed     Most recent  depression screenings:    07/14/2023    8:11 AM 10/25/2022    8:35 AM 06/03/2022    9:05 AM 11/30/2021    9:39 AM 08/17/2020    8:12 AM  Depression screen PHQ 2/9  Decreased Interest 2 0 0 2 0  Down, Depressed, Hopeless 3 0 1 1 0  PHQ - 2 Score 5 0 1 3 0  Altered sleeping 1 3 3 1 1   Tired, decreased energy 1 0 3 3 1   Change in appetite 0 3 0 3 0  Feeling bad or failure about yourself  2 0 0 0 0  Trouble concentrating 2 3 0 3 1  Moving slowly or fidgety/restless 2 3 0 1 0  Suicidal thoughts 0 0 0 0 0  PHQ-9 Score 13 12 7 14 3   Difficult doing work/chores Not difficult at all Somewhat difficult Somewhat difficult Somewhat difficult Not difficult at all      07/14/2023    8:11 AM 10/25/2022    8:35 AM 06/03/2022    9:06 AM 11/30/2021    9:39 AM  GAD 7 : Generalized Anxiety Score  Nervous, Anxious, on Edge 3 3 1 3   Control/stop worrying 3 3 1 3   Worry too much - different things 3 3 1 3   Trouble relaxing 3 3 1 1   Restless 3 3 1 1   Easily annoyed or irritable 3 3 1 3   Afraid - awful might happen  3 3 1 3   Total GAD 7 Score 21 21 7 17   Anxiety Difficulty Not difficult at all Somewhat difficult Somewhat difficult       Vision:Within last year and Dental: No current dental problems and Receives regular dental care  Patient Active Problem List   Diagnosis Date Noted   Morbid obesity (HCC) 06/03/2022   Restless leg syndrome 11/16/2020   Anxiety 05/19/2020   Abdominal obesity and metabolic syndrome 05/19/2020   Prediabetes 01/29/2020   Depression, recurrent (HCC) 01/23/2020   Class 2 obesity due to excess calories without serious comorbidity with body mass index (BMI) of 39.0 to 39.9 in adult 02/19/2019   Essential hypertension 02/19/2019   Vitamin D deficiency 02/19/2019   Vasodepressor syncope 06/12/2013   GERD (gastroesophageal reflux disease) 09/24/2012   Past Medical History:  Diagnosis Date   GERD (gastroesophageal reflux disease)    Hypertension    Migraines    Past  Surgical History:  Procedure Laterality Date   CHOLECYSTECTOMY     Social History   Tobacco Use   Smoking status: Never   Smokeless tobacco: Never  Vaping Use   Vaping status: Never Used  Substance Use Topics   Alcohol use: No   Drug use: No   Social History   Socioeconomic History   Marital status: Married    Spouse name: Not on file   Number of children: 2   Years of education: Not on file   Highest education level: Not on file  Occupational History   Not on file  Tobacco Use   Smoking status: Never   Smokeless tobacco: Never  Vaping Use   Vaping status: Never Used  Substance and Sexual Activity   Alcohol use: No   Drug use: No   Sexual activity: Yes  Other Topics Concern   Not on file  Social History Narrative   Not on file   Social Drivers of Health   Financial Resource Strain: Not on file  Food Insecurity: Not on file  Transportation Needs: Not on file  Physical Activity: Not on file  Stress: Not on file  Social Connections: Not on file  Intimate Partner Violence: Not on file   Family Status  Relation Name Status   Mother  Alive   Father  Alive  No partnership data on file   Family History  Problem Relation Age of Onset   Hyperlipidemia Mother    Hypertension Mother    Hyperlipidemia Father    Hypertension Father    Diabetes Father    Allergies  Allergen Reactions   Penicillins Rash    Rash as an infant      Patient Care Team: Dua Mehler, Doralee Albino, FNP as PCP - General (Family Medicine) Maisie Fus, MD as PCP - Cardiology (Cardiology)   Outpatient Medications Prior to Visit  Medication Sig   cyclobenzaprine (FLEXERIL) 5 MG tablet Take 1 tablet (5 mg total) by mouth 3 (three) times daily as needed for muscle spasms.   [DISCONTINUED] ondansetron (ZOFRAN-ODT) 4 MG disintegrating tablet Take 1 tablet (4 mg total) by mouth every 8 (eight) hours as needed for nausea or vomiting.   [DISCONTINUED] FLUoxetine (PROZAC) 20 MG capsule TAKE 1 CAPSULE  BY MOUTH EVERY DAY   [DISCONTINUED] furosemide (LASIX) 20 MG tablet TAKE 1-2 TABLETS (20-40 MG TOTAL) BY MOUTH DAILY.   [DISCONTINUED] lisinopril (ZESTRIL) 40 MG tablet TAKE 1 TABLET BY MOUTH EVERY DAY   [DISCONTINUED] pantoprazole (PROTONIX) 20 MG tablet TAKE 1 TABLET BY MOUTH  EVERY DAY   [DISCONTINUED] Semaglutide-Weight Management (WEGOVY) 2.4 MG/0.75ML SOAJ INJECT 2.4 MG INTO THE SKIN ONCE A WEEK FOR 28 DAYS. (Patient not taking: Reported on 07/14/2023)   No facility-administered medications prior to visit.    ROS per HPI     Objective:     BP 127/87   Pulse 65   Temp (!) 97.3 F (36.3 C)   Ht 5\' 2"  (1.575 m)   Wt 199 lb 6.4 oz (90.4 kg)   LMP 06/30/2023   SpO2 98%   BMI 36.47 kg/m  BP Readings from Last 3 Encounters:  07/14/23 127/87  03/10/23 115/82  01/03/23 125/87   Wt Readings from Last 3 Encounters:  07/14/23 199 lb 6.4 oz (90.4 kg)  03/10/23 212 lb 12.8 oz (96.5 kg)  01/03/23 220 lb 12.8 oz (100.2 kg)   SpO2 Readings from Last 3 Encounters:  07/14/23 98%  03/10/23 97%  01/03/23 95%      Physical Exam Vitals and nursing note reviewed. Exam conducted with a chaperone present.  Constitutional:      General: She is not in acute distress.    Appearance: Normal appearance. She is well-developed and well-groomed. She is obese. She is not ill-appearing, toxic-appearing or diaphoretic.  HENT:     Head: Normocephalic and atraumatic.     Jaw: There is normal jaw occlusion.     Right Ear: Hearing, tympanic membrane, ear canal and external ear normal.     Left Ear: Hearing, tympanic membrane, ear canal and external ear normal.     Nose: Nose normal.     Mouth/Throat:     Lips: Pink.     Mouth: Mucous membranes are moist.     Pharynx: Oropharynx is clear. Uvula midline.  Eyes:     General: Lids are normal.     Extraocular Movements: Extraocular movements intact.     Conjunctiva/sclera: Conjunctivae normal.     Pupils: Pupils are equal, round, and reactive to  light.  Neck:     Thyroid: No thyroid mass, thyromegaly or thyroid tenderness.     Vascular: No carotid bruit or JVD.     Trachea: Trachea and phonation normal.  Cardiovascular:     Rate and Rhythm: Normal rate and regular rhythm.     Chest Wall: PMI is not displaced.     Pulses: Normal pulses.     Heart sounds: Normal heart sounds. No murmur heard.    No friction rub. No gallop.  Pulmonary:     Effort: Pulmonary effort is normal. No respiratory distress.     Breath sounds: Normal breath sounds. No wheezing.  Abdominal:     General: Bowel sounds are normal. There is no distension or abdominal bruit.     Palpations: Abdomen is soft. There is no hepatomegaly or splenomegaly.     Tenderness: There is no abdominal tenderness. There is no right CVA tenderness or left CVA tenderness.     Hernia: No hernia is present. There is no hernia in the left inguinal area or right inguinal area.  Genitourinary:    General: Normal vulva.     Exam position: Lithotomy position.     Pubic Area: No rash or pubic lice.      Tanner stage (genital): 5.     Labia:        Right: No rash, tenderness, lesion or injury.        Left: No rash, tenderness, lesion or injury.      Urethra: No prolapse,  urethral pain, urethral swelling or urethral lesion.     Vagina: Normal.     Cervix: Cervical bleeding (scant, recent menses) present.     Uterus: Normal.      Adnexa: Right adnexa normal and left adnexa normal.     Rectum: Normal.  Musculoskeletal:        General: Normal range of motion.     Cervical back: Normal range of motion and neck supple.     Right lower leg: No edema.     Left lower leg: No edema.  Lymphadenopathy:     Cervical: No cervical adenopathy.     Lower Body: No right inguinal adenopathy. No left inguinal adenopathy.  Skin:    General: Skin is warm and dry.     Capillary Refill: Capillary refill takes less than 2 seconds.     Coloration: Skin is not cyanotic, jaundiced or pale.      Findings: No rash.  Neurological:     General: No focal deficit present.     Mental Status: She is alert and oriented to person, place, and time.     Cranial Nerves: No cranial nerve deficit.     Sensory: Sensation is intact. No sensory deficit.     Motor: Motor function is intact. No weakness.     Coordination: Coordination is intact. Coordination normal.     Gait: Gait is intact. Gait normal.     Deep Tendon Reflexes: Reflexes are normal and symmetric. Reflexes normal.  Psychiatric:        Attention and Perception: Attention and perception normal.        Mood and Affect: Mood and affect normal.        Speech: Speech normal.        Behavior: Behavior normal. Behavior is cooperative.        Thought Content: Thought content normal.        Cognition and Memory: Cognition and memory normal.        Judgment: Judgment normal.       Last CBC Lab Results  Component Value Date   WBC 8.0 03/10/2023   HGB 13.6 03/10/2023   HCT 40.7 03/10/2023   MCV 93 03/10/2023   MCH 31.0 03/10/2023   RDW 12.8 03/10/2023   PLT 285 03/10/2023   Last metabolic panel Lab Results  Component Value Date   GLUCOSE 104 (H) 03/10/2023   NA 141 03/10/2023   K 4.8 03/10/2023   CL 104 03/10/2023   CO2 17 (L) 03/10/2023   BUN 9 03/10/2023   CREATININE 0.89 03/10/2023   EGFR 82 03/10/2023   CALCIUM 9.7 03/10/2023   PROT 7.0 03/10/2023   ALBUMIN 4.6 03/10/2023   LABGLOB 2.4 03/10/2023   AGRATIO 1.8 06/03/2022   BILITOT 0.5 03/10/2023   ALKPHOS 92 03/10/2023   AST 20 03/10/2023   ALT 30 03/10/2023   ANIONGAP 10 08/15/2022   Last lipids Lab Results  Component Value Date   CHOL 191 03/10/2023   HDL 43 03/10/2023   LDLCALC 127 (H) 03/10/2023   TRIG 115 03/10/2023   CHOLHDL 4.4 03/10/2023   Last hemoglobin A1c Lab Results  Component Value Date   HGBA1C 5.7 (H) 10/25/2022   Last thyroid functions Lab Results  Component Value Date   TSH 0.691 03/10/2023   T4TOTAL 9.7 03/10/2023   Last  vitamin D Lab Results  Component Value Date   VD25OH 55.8 03/10/2023   Last vitamin B12 and Folate Lab Results  Component Value Date  [id number] 06/03/2022        Assessment & Plan:    Routine Health Maintenance and Physical Exam  Immunization History  Administered Date(s) Administered   Moderna Sars-Covid-2 Vaccination 07/31/2019, 08/28/2019   Td 09/24/2014    Health Maintenance  Topic Date Due   INFLUENZA VACCINE  07/24/2023 (Originally 11/24/2022)   COVID-19 Vaccine (3 - 2024-25 season) 07/30/2023 (Originally 12/25/2022)   DTaP/Tdap/Td (2 - Tdap) 09/23/2024   Cervical Cancer Screening (HPV/Pap Cotest)  07/14/2026   Colonoscopy  11/08/2029   Hepatitis C Screening  Completed   HIV Screening  Completed   HPV VACCINES  Aged Out   EKG: SB, 56, PR 196 ms, QT 398 ms, incomplete BBB, no acute ST-T changes, no changes from prior. Kari Baars, FNP-C  Discussed health benefits of physical activity, and encouraged her to engage in regular exercise appropriate for her age and condition.  Problem List Items Addressed This Visit       Cardiovascular and Mediastinum   Essential hypertension   Relevant Medications   lisinopril (ZESTRIL) 40 MG tablet   furosemide (LASIX) 20 MG tablet   Other Relevant Orders   CBC with Differential/Platelet   CMP14+EGFR   Lipid panel   Thyroid Panel With TSH   EKG 12-Lead (Completed)     Digestive   GERD (gastroesophageal reflux disease)   Relevant Medications   pantoprazole (PROTONIX) 20 MG tablet   ondansetron (ZOFRAN-ODT) 4 MG disintegrating tablet   Other Relevant Orders   CBC with Differential/Platelet     Other   Depression, recurrent (HCC)   Relevant Medications   FLUoxetine (PROZAC) 20 MG capsule   hydrOXYzine (ATARAX) 10 MG tablet   Prediabetes   Relevant Orders   CBC with Differential/Platelet   CMP14+EGFR   Lipid panel   Thyroid Panel With TSH   Bayer DCA Hb A1c Waived (Completed)   Anxiety   Relevant  Medications   FLUoxetine (PROZAC) 20 MG capsule   hydrOXYzine (ATARAX) 10 MG tablet   Other Visit Diagnoses       Annual physical exam    -  Primary   Relevant Orders   CBC with Differential/Platelet   CMP14+EGFR   Lipid panel   Thyroid Panel With TSH   Cytology - PAP([place]) (Completed)   Bayer DCA Hb A1c Waived (Completed)   EKG 12-Lead (Completed)   MM 3D SCREENING MAMMOGRAM BILATERAL BREAST     Encounter for screening for cardiovascular disorders       Relevant Orders   CBC with Differential/Platelet   CMP14+EGFR   Lipid panel     Encounter for screening mammogram for malignant neoplasm of breast       Relevant Orders   MM 3D SCREENING MAMMOGRAM BILATERAL BREAST     Encounter for Papanicolaou smear for cervical cancer screening       Relevant Orders   Cytology - PAP([place]) (Completed)     Nausea in adult       Relevant Medications   ondansetron (ZOFRAN-ODT) 4 MG disintegrating tablet   Other Relevant Orders   CMP14+EGFR     High risk medication use       Relevant Orders   CBC with Differential/Platelet   CMP14+EGFR   Lipid panel   Thyroid Panel With TSH   EKG 12-Lead (Completed)     Assessment and Plan    Anxiety and Depression Increased anxiety and depressive symptoms, including difficulty concentrating, crying spells, palpitations, and chest cramps, exacerbated by recent work  stressors. Currently on fluoxetine 20 mg, considered a low dose. Experienced panic attacks and unable to manage symptoms effectively with current regimen. - Increase fluoxetine to 40 mg daily by taking two pills. - Prescribe hydroxyzine 10-20 mg as needed for anxiety, up to three times daily. Advise caution regarding drowsiness, especially at work. - Report if symptoms do not improve with adjusted regimen.  Nausea Nausea potentially related to stress or medication. Has not taken [facility] for a month due to nausea and life stressors. Uses nausea medication as needed, approximately  twice a week. - Refill nausea medication for use as needed.  General Health Maintenance Due for routine health screenings, including mammogram and Pap smear. Previous breast lump, but mammograms are still appropriate, with follow-up ultrasounds if necessary. Mammograms recommended annually for early detection of breast cancer. - Order mammogram. - Perform Pap smear. - Update labs for physical examination.  Follow-up Requires follow-up to assess effectiveness of adjusted medication regimen for anxiety and depression. - Schedule follow-up appointment in six weeks to evaluate response to medication changes.       Return in about 6 weeks (around 08/25/2023), or if symptoms worsen or fail to improve, for Depression, Anxiety.     Kari Baars, FNP

## 2023-07-14 NOTE — Patient Instructions (Signed)
 If your symptoms worsen or you have thoughts of suicide/homicide, PLEASE SEEK IMMEDIATE MEDICAL ATTENTION.  You may always call the National Suicide Hotline.  This is available 24 hours a day, 7 days a week.  Their number is: 5810725763  Taking the medicine as directed and not missing any doses is one of the best things you can do to treat your depression.  Here are some things to keep in mind:  Side effects (stomach upset, some increased anxiety) may happen before you notice a benefit.  These side effects typically go away over time. Changes to your dose of medicine or a change in medication all together is sometimes necessary Most people need to be on medication at least 12 months Many people will notice an improvement within two weeks but the full effect of the medication can take up to 4-6 weeks Stopping the medication when you start feeling better often results in a return of symptoms Never discontinue your medication without contacting a health care professional first.  Some medications require gradual discontinuation/ taper and can make you sick if you stop them abruptly.  If your symptoms worsen or you have thoughts of suicide/homicide, PLEASE SEEK IMMEDIATE MEDICAL ATTENTION.  You may always call:  National Suicide Hotline: 440-761-4568 [place] Crisis Line: (424)588-3357 Crisis Recovery in [place]: 331-882-2643  These are available 24 hours a day, 7 days a week.

## 2023-07-15 LAB — CBC WITH DIFFERENTIAL/PLATELET
Basophils Absolute: 0 10*3/uL (ref 0.0–0.2)
Basos: 0 %
EOS (ABSOLUTE): 0.1 10*3/uL (ref 0.0–0.4)
Eos: 1 %
Hematocrit: 40.3 % (ref 34.0–46.6)
Hemoglobin: 13.3 g/dL (ref 11.1–15.9)
Immature Grans (Abs): 0 10*3/uL (ref 0.0–0.1)
Immature Granulocytes: 0 %
Lymphocytes Absolute: 1.6 10*3/uL (ref 0.7–3.1)
Lymphs: 19 %
MCH: 31.2 pg (ref 26.6–33.0)
MCHC: 33 g/dL (ref 31.5–35.7)
MCV: 95 fL (ref 79–97)
Monocytes Absolute: 0.5 10*3/uL (ref 0.1–0.9)
Monocytes: 6 %
Neutrophils Absolute: 6.3 10*3/uL (ref 1.4–7.0)
Neutrophils: 74 %
Platelets: 282 10*3/uL (ref 150–450)
RBC: 4.26 x10E6/uL (ref 3.77–5.28)
RDW: 13.3 % (ref 11.7–15.4)
WBC: 8.5 10*3/uL (ref 3.4–10.8)

## 2023-07-15 LAB — CMP14+EGFR
ALT: 46 IU/L — ABNORMAL HIGH (ref 0–32)
AST: 28 IU/L (ref 0–40)
Albumin: 4.5 g/dL (ref 3.9–4.9)
Alkaline Phosphatase: 99 IU/L (ref 44–121)
BUN/Creatinine Ratio: 11 (ref 9–23)
BUN: 9 mg/dL (ref 6–24)
Bilirubin Total: 0.5 mg/dL (ref 0.0–1.2)
CO2: 21 mmol/L (ref 20–29)
Calcium: 9.5 mg/dL (ref 8.7–10.2)
Chloride: 104 mmol/L (ref 96–106)
Creatinine, Ser: 0.84 mg/dL (ref 0.57–1.00)
Globulin, Total: 2.3 g/dL (ref 1.5–4.5)
Glucose: 102 mg/dL — ABNORMAL HIGH (ref 70–99)
Potassium: 4.6 mmol/L (ref 3.5–5.2)
Sodium: 141 mmol/L (ref 134–144)
Total Protein: 6.8 g/dL (ref 6.0–8.5)
eGFR: 88 mL/min/{1.73_m2} (ref >59)

## 2023-07-15 LAB — LIPID PANEL
Chol/HDL Ratio: 4.4 ratio (ref 0.0–4.4)
Cholesterol, Total: 203 mg/dL — ABNORMAL HIGH (ref 100–199)
HDL: 46 mg/dL (ref >39)
LDL Chol Calc (NIH): 134 mg/dL — ABNORMAL HIGH (ref 0–99)
Triglycerides: 129 mg/dL (ref 0–149)
VLDL Cholesterol Cal: 23 mg/dL (ref 5–40)

## 2023-07-15 LAB — THYROID PANEL WITH TSH
Free Thyroxine Index: 2.8 (ref 1.2–4.9)
T3 Uptake Ratio: 30 % (ref 24–39)
T4, Total: 9.3 ug/dL (ref 4.5–12.0)
TSH: 0.393 u[IU]/mL — ABNORMAL LOW (ref 0.450–4.500)

## 2023-07-16 ENCOUNTER — Encounter: Payer: Self-pay | Admitting: Family Medicine

## 2023-07-17 ENCOUNTER — Other Ambulatory Visit: Payer: Self-pay

## 2023-07-17 DIAGNOSIS — F339 Major depressive disorder, recurrent, unspecified: Secondary | ICD-10-CM

## 2023-07-17 DIAGNOSIS — F419 Anxiety disorder, unspecified: Secondary | ICD-10-CM

## 2023-07-17 MED ORDER — FLUOXETINE HCL 20 MG PO CAPS
40.0000 mg | ORAL_CAPSULE | Freq: Every day | ORAL | 1 refills | Status: DC
Start: 2023-07-17 — End: 2023-07-18

## 2023-07-18 ENCOUNTER — Telehealth: Payer: Self-pay

## 2023-07-18 MED ORDER — FLUOXETINE HCL 20 MG PO CAPS
40.0000 mg | ORAL_CAPSULE | Freq: Every day | ORAL | 1 refills | Status: DC
Start: 2023-07-18 — End: 2023-09-11

## 2023-07-18 NOTE — Telephone Encounter (Signed)
 Copied from CRM [id number]. Topic: Clinical - Lab/Test Results >> Jul 18, 2023 12:59 PM Fuller Mandril wrote: Reason for CRM: Patient returned missed call. Lab results call. Read note as written. Patient understood. Will review on Mychart and send message if she has any further questions. Thank You

## 2023-07-18 NOTE — Addendum Note (Signed)
 Addended by: Sonny Masters on: 07/18/2023 04:42 PM   Modules accepted: Orders

## 2023-07-18 NOTE — Telephone Encounter (Signed)
Refer to lab results.  

## 2023-07-21 ENCOUNTER — Ambulatory Visit
Admission: RE | Admit: 2023-07-21 | Discharge: 2023-07-21 | Disposition: A | Discharge status: Home / Self Care | Source: Ambulatory Visit | Attending: Family Medicine | Admitting: Family Medicine

## 2023-07-21 LAB — CYTOLOGY - PAP
Chlamydia: NEGATIVE
Comment: NEGATIVE
Comment: NEGATIVE
Comment: NEGATIVE
Comment: NEGATIVE
Comment: NORMAL
Diagnosis: NEGATIVE
HSV1: NEGATIVE
HSV2: NEGATIVE
High risk HPV: NEGATIVE
Neisseria Gonorrhea: NEGATIVE
Trichomonas: NEGATIVE

## 2023-07-23 ENCOUNTER — Other Ambulatory Visit: Payer: Self-pay | Admitting: Family Medicine

## 2023-07-23 DIAGNOSIS — F339 Major depressive disorder, recurrent, unspecified: Secondary | ICD-10-CM

## 2023-07-23 DIAGNOSIS — F419 Anxiety disorder, unspecified: Secondary | ICD-10-CM

## 2023-07-25 ENCOUNTER — Encounter: Payer: Self-pay | Admitting: Family Medicine

## 2023-08-29 ENCOUNTER — Ambulatory Visit: Payer: Self-pay | Admitting: Family Medicine

## 2023-08-29 ENCOUNTER — Encounter: Payer: Self-pay | Admitting: Family Medicine

## 2023-08-29 VITALS — BP 113/77 | HR 62 | Temp 97.7°F | Ht 62.0 in | Wt 199.0 lb

## 2023-08-29 DIAGNOSIS — E8881 Metabolic syndrome: Secondary | ICD-10-CM | POA: Diagnosis not present

## 2023-08-29 DIAGNOSIS — E669 Obesity, unspecified: Secondary | ICD-10-CM

## 2023-08-29 DIAGNOSIS — F339 Major depressive disorder, recurrent, unspecified: Secondary | ICD-10-CM

## 2023-08-29 DIAGNOSIS — F419 Anxiety disorder, unspecified: Secondary | ICD-10-CM

## 2023-08-29 DIAGNOSIS — I10 Essential (primary) hypertension: Secondary | ICD-10-CM

## 2023-08-29 MED ORDER — WEGOVY 1.7 MG/0.75ML ~~LOC~~ SOAJ: 1.7000 mg | SUBCUTANEOUS | 3 refills | Status: DC

## 2023-08-29 NOTE — Progress Notes (Signed)
 Subjective:  Patient ID: Kelly Jennings, female    DOB: [birth year], 43 y.o.   MRN: [id number]  Patient Care Team: Galvin Jules, FNP as PCP - General (Family Medicine) Bridgette Campus, MD as PCP - Cardiology (Cardiology)   Chief Complaint:  Anxiety and Depression (6 week follow up - patient states she is a lot better. )   HPI: Kelly Jennings is a 42 y.o. female presenting on 08/29/2023 for Anxiety and Depression (6 week follow up - patient states she is a lot better. )   History of Present Illness   Kelly Jennings is a 43 year old female with anxiety and depression who presents for follow-up.  She has experienced significant improvement in her anxiety and depression symptoms, expressing relief at not being in the same 'dark, dark place' as before. However, she continues to experience anxiety triggered by loud noises, which cause her to think 'somebody's died or something.' No thoughts of self-harm or harm to others.  She is currently taking Prozac  at a dose of 40 mg, two pills daily, with no noticeable side effects. Additionally, she takes hydroxyzine  approximately four times a week, primarily at night, to help with sleep difficulties due to its sedative effects.  She has not taken Wegovy  for about a month due to side effects at the 2.4 mg dose, including nausea, vomiting, and diarrhea, particularly on the first day after administration. She attempted to mitigate these effects by changing the time of administration from night to morning, but symptoms persisted. Her insurance covers the medication.         08/29/2023    2:39 PM 07/14/2023    8:11 AM 10/25/2022    8:35 AM 06/03/2022    9:05 AM 11/30/2021    9:39 AM  Depression screen PHQ 2/9  Decreased Interest 1 2 0 0 2  Down, Depressed, Hopeless 1 3 0 1 1  PHQ - 2 Score 2 5 0 1 3  Altered sleeping 2 1 3 3 1   Tired, decreased energy 2 1 0 3 3  Change in appetite 1 0 3 0 3  Feeling bad or failure about yourself  1 2 0 0 0  Trouble  concentrating 3 2 3  0 3  Moving slowly or fidgety/restless 3 2 3  0 1  Suicidal thoughts 0 0 0 0 0  PHQ-9 Score 14 13 12 7 14   Difficult doing work/chores  Not difficult at all Somewhat difficult Somewhat difficult Somewhat difficult      08/29/2023    2:40 PM 07/14/2023    8:11 AM 10/25/2022    8:35 AM 06/03/2022    9:06 AM  GAD 7 : Generalized Anxiety Score  Nervous, Anxious, on Edge 3 3 3 1   Control/stop worrying 3 3 3 1   Worry too much - different things 3 3 3 1   Trouble relaxing 3 3 3 1   Restless 3 3 3 1   Easily annoyed or irritable 3 3 3 1   Afraid - awful might happen 3 3 3 1   Total GAD 7 Score 21 21 21 7   Anxiety Difficulty  Not difficult at all Somewhat difficult Somewhat difficult        Relevant past medical, surgical, family, and social history reviewed and updated as indicated.  Allergies and medications reviewed and updated. Data reviewed: Chart in Epic.   Past Medical History:  Diagnosis Date   GERD (gastroesophageal reflux disease)    Hypertension    Migraines  Past Surgical History:  Procedure Laterality Date   CHOLECYSTECTOMY      Social History   Socioeconomic History   Marital status: Married    Spouse name: Not on file   Number of children: 2   Years of education: Not on file   Highest education level: Not on file  Occupational History   Not on file  Tobacco Use   Smoking status: Never   Smokeless tobacco: Never  Vaping Use   Vaping status: Never Used  Substance and Sexual Activity   Alcohol use: No   Drug use: No   Sexual activity: Yes  Other Topics Concern   Not on file  Social History Narrative   Not on file   Social Drivers of Health   Financial Resource Strain: Not on file  Food Insecurity: Not on file  Transportation Needs: Not on file  Physical Activity: Not on file  Stress: Not on file  Social Connections: Not on file  Intimate Partner Violence: Not on file    Outpatient Encounter Medications as of 08/29/2023   Medication Sig   cyclobenzaprine  (FLEXERIL ) 5 MG tablet Take 1 tablet (5 mg total) by mouth 3 (three) times daily as needed for muscle spasms.   FLUoxetine  (PROZAC ) 20 MG capsule Take 2 capsules (40 mg total) by mouth daily.   furosemide  (LASIX ) 20 MG tablet Take 1-2 tablets (20-40 mg total) by mouth daily.   hydrOXYzine  (ATARAX ) 10 MG tablet TAKE 1 TO 2 TABLETS (10 TO 20MG  TOTAL) BY MOUTH 3 TIMES A DAY AS NEEDED FOR ANXIETY   lisinopril  (ZESTRIL ) 40 MG tablet Take 1 tablet (40 mg total) by mouth daily.   ondansetron  (ZOFRAN -ODT) 4 MG disintegrating tablet Take 1 tablet (4 mg total) by mouth every 8 (eight) hours as needed for nausea or vomiting.   pantoprazole  (PROTONIX ) 20 MG tablet Take 1 tablet (20 mg total) by mouth daily.   Semaglutide -Weight Management (WEGOVY ) 1.7 MG/0.75ML SOAJ Inject 1.7 mg into the skin once a week.   No facility-administered encounter medications on file as of 08/29/2023.    Allergies  Allergen Reactions   Penicillins Rash    Rash as an infant    Pertinent ROS per HPI, otherwise unremarkable      Objective:  BP 113/77   Pulse 62   Temp 97.7 F (36.5 C)   Ht 5\' 2"  (1.575 m)   Wt 199 lb (90.3 kg)   SpO2 97%   BMI 36.40 kg/m    Wt Readings from Last 3 Encounters:  08/29/23 199 lb (90.3 kg)  07/14/23 199 lb 6.4 oz (90.4 kg)  03/10/23 212 lb 12.8 oz (96.5 kg)    Physical Exam Vitals and nursing note reviewed.  Constitutional:      General: She is not in acute distress.    Appearance: Normal appearance. She is well-developed and well-groomed. She is obese. She is not ill-appearing, toxic-appearing or diaphoretic.  HENT:     Head: Normocephalic and atraumatic.     Jaw: There is normal jaw occlusion.     Right Ear: Hearing normal.     Left Ear: Hearing normal.     Nose: Nose normal.     Mouth/Throat:     Lips: Pink.     Mouth: Mucous membranes are moist.     Pharynx: Oropharynx is clear. Uvula midline.  Eyes:     General: Lids are normal.      Extraocular Movements: Extraocular movements intact.     Conjunctiva/sclera: Conjunctivae  normal.     Pupils: Pupils are equal, round, and reactive to light.  Neck:     Trachea: Trachea and phonation normal.  Cardiovascular:     Rate and Rhythm: Normal rate and regular rhythm.     Chest Wall: PMI is not displaced.     Pulses: Normal pulses.     Heart sounds: Normal heart sounds. No murmur heard.    No friction rub. No gallop.  Pulmonary:     Effort: Pulmonary effort is normal. No respiratory distress.     Breath sounds: Normal breath sounds. No wheezing.  Musculoskeletal:        General: Normal range of motion.     Cervical back: Neck supple.     Right lower leg: No edema.     Left lower leg: No edema.  Skin:    General: Skin is warm and dry.     Capillary Refill: Capillary refill takes less than 2 seconds.     Coloration: Skin is not cyanotic, jaundiced or pale.     Findings: No rash.  Neurological:     General: No focal deficit present.     Mental Status: She is alert and oriented to person, place, and time.     Sensory: Sensation is intact.     Motor: Motor function is intact.     Coordination: Coordination is intact.     Gait: Gait is intact.     Deep Tendon Reflexes: Reflexes are normal and symmetric.  Psychiatric:        Attention and Perception: Attention and perception normal.        Mood and Affect: Mood and affect normal.        Speech: Speech normal.        Behavior: Behavior normal. Behavior is cooperative.        Thought Content: Thought content normal.        Cognition and Memory: Cognition and memory normal.        Judgment: Judgment normal.       Results for orders placed or performed in visit on 07/14/23  Cytology - PAP([place])   Collection Time: 07/14/23  8:22 AM  Result Value Ref Range   High risk HPV Negative    Neisseria Gonorrhea Negative    Chlamydia Negative    Trichomonas Negative    HSV1 Negative    HSV2 Negative    Adequacy       Satisfactory for evaluation; transformation zone component PRESENT.   Diagnosis      - Negative for intraepithelial lesion or malignancy (NILM)   Comment Normal Reference Range Trichomonas - Negative    Comment Normal Reference Range HPV - Negative    Comment Normal Reference [place] Chlamydia - Negative    Comment      Normal Reference Range Neisseria Gonorrhea - Negative   Comment Normal Reference Range HSV - Negative   CBC with Differential/Platelet   Collection Time: 07/14/23  8:50 AM  Result Value Ref Range   WBC 8.5 3.4 - 10.8 x10E3/uL   RBC 4.26 3.77 - 5.28 x10E6/uL   Hemoglobin 13.3 11.1 - 15.9 g/dL   Hematocrit 16.1 09.6 - 46.6 %   MCV 95 79 - 97 fL   MCH 31.2 26.6 - 33.0 pg   MCHC 33.0 31.5 - 35.7 g/dL   RDW 04.5 40.9 - 81.1 %   Platelets 282 150 - 450 x10E3/uL   Neutrophils 74 Not Estab. %   Lymphs 19 Not  Estab. %   Monocytes 6 Not Estab. %   Eos 1 Not Estab. %   Basos 0 Not Estab. %   Neutrophils Absolute 6.3 1.4 - 7.0 x10E3/uL   Lymphocytes Absolute 1.6 0.7 - 3.1 x10E3/uL   Monocytes Absolute 0.5 0.1 - 0.9 x10E3/uL   EOS (ABSOLUTE) 0.1 0.0 - 0.4 x10E3/uL   Basophils Absolute 0.0 0.0 - 0.2 x10E3/uL   Immature Granulocytes 0 Not Estab. %   Immature Grans (Abs) 0.0 0.0 - 0.1 x10E3/uL  CMP14+EGFR   Collection Time: 07/14/23  8:50 AM  Result Value Ref Range   Glucose 102 (H) 70 - 99 mg/dL   BUN 9 6 - 24 mg/dL   Creatinine, Ser 9.56 0.57 - 1.00 mg/dL   eGFR 88 >38 VF/IEP/3.29   BUN/Creatinine Ratio 11 9 - 23   Sodium 141 134 - 144 mmol/L   Potassium 4.6 3.5 - 5.2 mmol/L   Chloride 104 96 - 106 mmol/L   CO2 21 20 - 29 mmol/L   Calcium 9.5 8.7 - 10.2 mg/dL   Total Protein 6.8 6.0 - 8.5 g/dL   Albumin 4.5 3.9 - 4.9 g/dL   Globulin, Total 2.3 1.5 - 4.5 g/dL   Bilirubin Total 0.5 0.0 - 1.2 mg/dL   Alkaline Phosphatase 99 44 - 121 IU/L   AST 28 0 - 40 IU/L   ALT 46 (H) 0 - 32 IU/L  Lipid panel   Collection Time: 07/14/23  8:50 AM  Result Value Ref Range    Cholesterol, Total 203 (H) 100 - 199 mg/dL   Triglycerides 518 0 - 149 mg/dL   HDL 46 >84 mg/dL   VLDL Cholesterol Cal 23 5 - 40 mg/dL   LDL Chol Calc (NIH) 166 (H) 0 - 99 mg/dL   Chol/HDL Ratio 4.4 0.0 - 4.4 ratio  Thyroid  Panel With TSH   Collection Time: 07/14/23  8:50 AM  Result Value Ref Range   TSH 0.393 (L) 0.450 - 4.500 uIU/mL   T4, Total 9.3 4.5 - 12.0 ug/dL   T3 Uptake Ratio 30 24 - 39 %   Free Thyroxine Index 2.8 1.2 - 4.9  Bayer DCA Hb A1c Waived   Collection Time: 07/14/23  8:58 AM  Result Value Ref Range   HB A1C (BAYER DCA - WAIVED) 5.2 4.8 - 5.6 %       Pertinent labs & imaging results that were available during my care of the patient were reviewed by me and considered in my medical decision making.  Assessment & Plan:  Mer[place] was seen today for anxiety and depression.  Diagnoses and all orders for this visit:  Depression, recurrent (HCC) Anxiety Continue current regimen.   Abdominal obesity and metabolic syndrome -     Semaglutide -Weight Management (WEGOVY ) 1.7 MG/0.75ML SOAJ; Inject 1.7 mg into the skin once a week.  Morbid obesity (HCC) -     Semaglutide -Weight Management (WEGOVY ) 1.7 MG/0.75ML SOAJ; Inject 1.7 mg into the skin once a week.  Essential hypertension -     Semaglutide -Weight Management (WEGOVY ) 1.7 MG/0.75ML SOAJ; Inject 1.7 mg into the skin once a week.       Adverse effects of Wegovy  Experiencing significant gastrointestinal side effects (nausea, vomiting, diarrhea) with Wegovy  2.4 mg, particularly on the first day after administration. Has not taken Wegovy  for a month due to these side effects. - Reduce Wegovy  dose to 1.7 mg weekly - Monitor for persisting side effects - Plan for BMI and labs in approximately three months  Depression Depression is significantly improved. No current thoughts of self-harm or harm to others. Continues on fluoxetine  40 mg daily with no side effects. Consideration for dose increase if symptoms do not  continue to improve. - Continue fluoxetine  40 mg daily - Evaluate for potential dose increase at next visit if symptoms do not improve  Anxiety Anxiety symptoms are better managed. Hydroxyzine  is used approximately four times a week, primarily at night due to sedative effects. No significant side effects reported. - Continue hydroxyzine  as needed, primarily at night          Continue all other maintenance medications.  Follow up plan: Return in about 3 months (around 11/29/2023), or if symptoms worsen or fail to improve, for BMI, labs.   Continue healthy lifestyle choices, including diet (rich in fruits, vegetables, and lean proteins, and low in salt and simple carbohydrates) and exercise (at least 30 minutes of moderate physical activity daily).  Educational handout given for depression, crisis intervention hotline numbers.   The above assessment and management plan was discussed with the patient. The patient verbalized understanding of and has agreed to the management plan. Patient is aware to call the clinic if they develop any new symptoms or if symptoms persist or worsen. Patient is aware when to return to the clinic for a follow-up visit. Patient educated on when it is appropriate to go to the emergency department.   Kattie Parrot, FNP-C [facility] [phone]

## 2023-08-29 NOTE — Patient Instructions (Signed)
 If your symptoms worsen or you have thoughts of suicide/homicide, PLEASE SEEK IMMEDIATE MEDICAL ATTENTION.  You may always call the National Suicide Hotline.  This is available 24 hours a day, 7 days a week.  Their number is: 5810725763  Taking the medicine as directed and not missing any doses is one of the best things you can do to treat your depression.  Here are some things to keep in mind:  Side effects (stomach upset, some increased anxiety) may happen before you notice a benefit.  These side effects typically go away over time. Changes to your dose of medicine or a change in medication all together is sometimes necessary Most people need to be on medication at least 12 months Many people will notice an improvement within two weeks but the full effect of the medication can take up to 4-6 weeks Stopping the medication when you start feeling better often results in a return of symptoms Never discontinue your medication without contacting a health care professional first.  Some medications require gradual discontinuation/ taper and can make you sick if you stop them abruptly.  If your symptoms worsen or you have thoughts of suicide/homicide, PLEASE SEEK IMMEDIATE MEDICAL ATTENTION.  You may always call:  National Suicide Hotline: 440-761-4568 [place] Crisis Line: (424)588-3357 Crisis Recovery in [place]: 331-882-2643  These are available 24 hours a day, 7 days a week.

## 2023-09-11 ENCOUNTER — Other Ambulatory Visit: Payer: Self-pay | Admitting: Family Medicine

## 2023-09-11 DIAGNOSIS — F419 Anxiety disorder, unspecified: Secondary | ICD-10-CM

## 2023-09-11 DIAGNOSIS — F339 Major depressive disorder, recurrent, unspecified: Secondary | ICD-10-CM

## 2023-10-27 ENCOUNTER — Other Ambulatory Visit: Payer: Self-pay | Admitting: Family Medicine

## 2023-10-27 DIAGNOSIS — F419 Anxiety disorder, unspecified: Secondary | ICD-10-CM

## 2023-10-27 DIAGNOSIS — F339 Major depressive disorder, recurrent, unspecified: Secondary | ICD-10-CM

## 2023-12-05 ENCOUNTER — Ambulatory Visit (INDEPENDENT_AMBULATORY_CARE_PROVIDER_SITE_OTHER): Admitting: Family Medicine

## 2023-12-05 ENCOUNTER — Ambulatory Visit: Payer: Self-pay | Admitting: Family Medicine

## 2023-12-05 ENCOUNTER — Encounter: Payer: Self-pay | Admitting: Family Medicine

## 2023-12-05 VITALS — BP 119/81 | HR 62 | Temp 97.0°F | Ht 62.0 in | Wt 194.2 lb

## 2023-12-05 DIAGNOSIS — K2100 Gastro-esophageal reflux disease with esophagitis, without bleeding: Secondary | ICD-10-CM | POA: Diagnosis not present

## 2023-12-05 DIAGNOSIS — R002 Palpitations: Secondary | ICD-10-CM

## 2023-12-05 DIAGNOSIS — I10 Essential (primary) hypertension: Secondary | ICD-10-CM | POA: Diagnosis not present

## 2023-12-05 DIAGNOSIS — R110 Nausea: Secondary | ICD-10-CM

## 2023-12-05 DIAGNOSIS — R5381 Other malaise: Secondary | ICD-10-CM

## 2023-12-05 DIAGNOSIS — F339 Major depressive disorder, recurrent, unspecified: Secondary | ICD-10-CM

## 2023-12-05 DIAGNOSIS — R7303 Prediabetes: Secondary | ICD-10-CM

## 2023-12-05 DIAGNOSIS — F419 Anxiety disorder, unspecified: Secondary | ICD-10-CM

## 2023-12-05 DIAGNOSIS — E559 Vitamin D deficiency, unspecified: Secondary | ICD-10-CM

## 2023-12-05 LAB — BAYER DCA HB A1C WAIVED: HB A1C (BAYER DCA - WAIVED): 5.4 % (ref 4.8–5.6)

## 2023-12-05 MED ORDER — LISINOPRIL 40 MG PO TABS: 40.0000 mg | ORAL_TABLET | Freq: Every day | ORAL | 1 refills | Status: DC

## 2023-12-05 MED ORDER — FUROSEMIDE 20 MG PO TABS: 20.0000 mg | ORAL_TABLET | Freq: Every day | ORAL | 1 refills | Status: DC

## 2023-12-05 MED ORDER — PANTOPRAZOLE SODIUM 20 MG PO TBEC: 20.0000 mg | DELAYED_RELEASE_TABLET | Freq: Every day | ORAL | 1 refills | Status: DC

## 2023-12-05 MED ORDER — FLUOXETINE HCL 20 MG PO CAPS: 40.0000 mg | ORAL_CAPSULE | Freq: Every day | ORAL | 0 refills | Status: DC

## 2023-12-05 NOTE — Progress Notes (Signed)
 Subjective:  Patient ID: Kelly Jennings, female    DOB: 05/12/1980, 43 y.o.   MRN: [id number]  Patient Care Team: Severa Rock HERO, FNP as PCP - General (Family Medicine) Alvan Ronal BRAVO, MD (Inactive) as PCP - Cardiology (Cardiology)   Chief Complaint:  Medical Management of Chronic Issues (3 month follow up ) and Fatigue (X 1 month)   HPI: Kelly Jennings is a 43 y.o. female presenting on 12/05/2023 for Medical Management of Chronic Issues (3 month follow up ) and Fatigue (X 1 month)   Kelly Jennings is a 43 year old female who presents with palpitations, fatigue, and nausea.  She has been experiencing episodes of palpitations, described as her heart 'beating out of my chest,' along with fatigue and sudden sweating for the past two to three weeks. These episodes are accompanied by a feeling of being overwhelmed and tired all the time.  She reports frequent nausea, sometimes triggered by activities such as brushing her teeth or even while sitting still. She is currently taking Zofran  for nausea, which she uses approximately twice a week, and tries to limit its use to avoid developing a tolerance.  She discontinued Wegovy  three months ago due to insurance issues and has since been managing her weight by drinking water and limiting food intake. Her weight has decreased from 199 pounds in May to 194 pounds currently, although she mentions having lost down to 182 pounds at one point before gaining some back.  She is currently taking fluoxetine  20 mg daily. She reports poor sleep, characterized by tossing and turning throughout the night.  No use of over-the-counter medications or supplements for weight loss and no excessive caffeine use.         12/05/2023    8:48 AM 08/29/2023    2:39 PM 07/14/2023    8:11 AM 10/25/2022    8:35 AM 06/03/2022    9:05 AM  Depression screen PHQ 2/9  Decreased Interest 2 1 2  0 0  Down, Depressed, Hopeless 1 1 3  0 1  PHQ - 2 Score 3 2 5  0 1  Altered  sleeping 2 2 1 3 3   Tired, decreased energy 3 2 1  0 3  Change in appetite 1 1 0 3 0  Feeling bad or failure about yourself  1 1 2  0 0  Trouble concentrating 3 3 2 3  0  Moving slowly or fidgety/restless 1 3 2 3  0  Suicidal thoughts 0 0 0 0 0  PHQ-9 Score 14 14 13 12 7   Difficult doing work/chores Very difficult  Not difficult at all Somewhat difficult Somewhat difficult      12/05/2023    8:48 AM 08/29/2023    2:40 PM 07/14/2023    8:11 AM 10/25/2022    8:35 AM  GAD 7 : Generalized Anxiety Score  Nervous, Anxious, on Edge 3 3 3 3   Control/stop worrying 3 3 3 3   Worry too much - different things 3 3 3 3   Trouble relaxing 3 3 3 3   Restless 3 3 3 3   Easily annoyed or irritable 3 3 3 3   Afraid - awful might happen 3 3 3 3   Total GAD 7 Score 21 21 21 21   Anxiety Difficulty Not difficult at all  Not difficult at all Somewhat difficult        Relevant past medical, surgical, family, and social history reviewed and updated as indicated.  Allergies and medications reviewed and updated. Data reviewed:  Chart in Epic.   Past Medical History:  Diagnosis Date   GERD (gastroesophageal reflux disease)    Hypertension    Migraines     Past Surgical History:  Procedure Laterality Date   CHOLECYSTECTOMY      Social History   Socioeconomic History   Marital status: Married    Spouse name: Not on file   Number of children: 2   Years of education: Not on file   Highest education level: Not on file  Occupational History   Not on file  Tobacco Use   Smoking status: Never   Smokeless tobacco: Never  Vaping Use   Vaping status: Never Used  Substance and Sexual Activity   Alcohol use: No   Drug use: No   Sexual activity: Yes  Other Topics Concern   Not on file  Social History Narrative   Not on file   Social Drivers of Health   Financial Resource Strain: Not on file  Food Insecurity: Not on file  Transportation Needs: Not on file  Physical Activity: Not on file  Stress: Not  on file  Social Connections: Not on file  Intimate Partner Violence: Not on file    Outpatient Encounter Medications as of 12/05/2023  Medication Sig   cyclobenzaprine  (FLEXERIL ) 5 MG tablet Take 1 tablet (5 mg total) by mouth 3 (three) times daily as needed for muscle spasms.   hydrOXYzine  (ATARAX ) 10 MG tablet TAKE 1 TO 2 TABLETS (10 TO 20MG  TOTAL) BY MOUTH 3 TIMES A DAY AS NEEDED FOR ANXIETY   ondansetron  (ZOFRAN -ODT) 4 MG disintegrating tablet Take 1 tablet (4 mg total) by mouth every 8 (eight) hours as needed for nausea or vomiting.   [DISCONTINUED] FLUoxetine  (PROZAC ) 20 MG capsule TAKE 1 CAPSULE BY MOUTH EVERY DAY   [DISCONTINUED] furosemide  (LASIX ) 20 MG tablet Take 1-2 tablets (20-40 mg total) by mouth daily.   [DISCONTINUED] lisinopril  (ZESTRIL ) 40 MG tablet Take 1 tablet (40 mg total) by mouth daily.   [DISCONTINUED] pantoprazole  (PROTONIX ) 20 MG tablet Take 1 tablet (20 mg total) by mouth daily.   FLUoxetine  (PROZAC ) 20 MG capsule Take 2 capsules (40 mg total) by mouth daily.   furosemide  (LASIX ) 20 MG tablet Take 1-2 tablets (20-40 mg total) by mouth daily.   lisinopril  (ZESTRIL ) 40 MG tablet Take 1 tablet (40 mg total) by mouth daily.   pantoprazole  (PROTONIX ) 20 MG tablet Take 1 tablet (20 mg total) by mouth daily.   [DISCONTINUED] Semaglutide -Weight Management (WEGOVY ) 1.7 MG/0.75ML SOAJ Inject 1.7 mg into the skin once a week.   No facility-administered encounter medications on file as of 12/05/2023.    Allergies  Allergen Reactions   Penicillins Rash    Rash as an infant    Pertinent ROS per HPI, otherwise unremarkable      Objective:  BP 119/81   Pulse 62   Temp (!) 97 F (36.1 C)   Ht 5' 2 (1.575 m)   Wt 194 lb 3.2 oz (88.1 kg)   SpO2 99%   BMI 35.52 kg/m    Wt Readings from Last 3 Encounters:  12/05/23 194 lb 3.2 oz (88.1 kg)  08/29/23 199 lb (90.3 kg)  07/14/23 199 lb 6.4 oz (90.4 kg)    Physical Exam Vitals and nursing note reviewed.   Constitutional:      General: She is not in acute distress.    Appearance: Normal appearance. She is obese. She is not ill-appearing, toxic-appearing or diaphoretic.  HENT:  Head: Normocephalic and atraumatic.     Right Ear: External ear normal.     Left Ear: External ear normal.     Nose: Nose normal.     Mouth/Throat:     Mouth: Mucous membranes are moist.     Pharynx: Oropharynx is clear.  Eyes:     Conjunctiva/sclera: Conjunctivae normal.     Pupils: Pupils are equal, round, and reactive to light.  Neck:     Vascular: No carotid bruit.  Cardiovascular:     Rate and Rhythm: Normal rate and regular rhythm.     Heart sounds: Normal heart sounds.  Pulmonary:     Effort: Pulmonary effort is normal.     Breath sounds: Normal breath sounds.  Abdominal:     General: Bowel sounds are normal.     Palpations: Abdomen is soft.  Musculoskeletal:     Cervical back: Neck supple.     Right lower leg: No edema.     Left lower leg: No edema.  Skin:    General: Skin is warm and dry.     Capillary Refill: Capillary refill takes less than 2 seconds.  Neurological:     General: No focal deficit present.     Mental Status: She is alert and oriented to person, place, and time.  Psychiatric:        Mood and Affect: Mood normal.        Behavior: Behavior normal.        Thought Content: Thought content normal.        Judgment: Judgment normal.     Results for orders placed or performed in visit on 12/05/23  Bayer DCA Hb A1c Waived   Collection Time: 12/05/23  9:26 AM  Result Value Ref Range   HB A1C (BAYER DCA - WAIVED) 5.4 4.8 - 5.6 %       Pertinent labs & imaging results that were available during my care of the patient were reviewed by me and considered in my medical decision making.  Assessment & Plan:  Kelly Jennings was seen today for medical management of chronic issues and fatigue.  Diagnoses and all orders for this visit:  Prediabetes -     Lipid panel -     CMP14+EGFR -      Anemia Profile B -     Bayer DCA Hb A1c Waived  Gastroesophageal reflux disease with esophagitis without hemorrhage -     pantoprazole  (PROTONIX ) 20 MG tablet; Take 1 tablet (20 mg total) by mouth daily.  Essential hypertension -     Lipid panel -     lisinopril  (ZESTRIL ) 40 MG tablet; Take 1 tablet (40 mg total) by mouth daily. -     furosemide  (LASIX ) 20 MG tablet; Take 1-2 tablets (20-40 mg total) by mouth daily.  Vitamin D  deficiency -     CMP14+EGFR -     Vitamin D , 25-hydroxy  Palpitations -     CMP14+EGFR -     Anemia Profile B -     Hormone Panel -     LONG TERM MONITOR (3-14 DAYS); Future  Malaise and fatigue -     CMP14+EGFR -     Anemia Profile B -     Hormone Panel -     LONG TERM MONITOR (3-14 DAYS); Future  Nausea in adult -     CMP14+EGFR  Depression, recurrent (HCC) -     CMP14+EGFR -     Anemia Profile B -  Hormone Panel -     FLUoxetine  (PROZAC ) 20 MG capsule; Take 2 capsules (40 mg total) by mouth daily.  Anxiety -     CMP14+EGFR -     Anemia Profile B -     Hormone Panel -     FLUoxetine  (PROZAC ) 20 MG capsule; Take 2 capsules (40 mg total) by mouth daily.       Anxiety disorder Symptoms include palpitations, feeling overwhelmed, fatigue, and malaise, indicating poor control of anxiety. - Increase fluoxetine  to 40 mg daily, taken in the morning. - Order Zio heart monitor to evaluate for arrhythmias. - Order lab work to assess electrolytes, hormone profile, cholesterol, vitamin D , and thyroid  function.  Nausea Intermittent nausea during routine activities, managed with Zofran  as needed, approximately twice a week. - Continue Zofran  as needed for nausea.  Obesity Weight decreased from 199 lbs in May to 194 lbs currently, with a previous low of 182 lbs. Management affected by discontinuation of Wegovy  due to insurance coverage issues.  Hypertension Blood pressure is well controlled with current medication regimen.        Total time  spent with patient today was 40 minutes, this time was spent reviewing prior charts, labs, x-rays, discussing plan of care, and documenting the encounter.   Continue all other maintenance medications.  Follow up plan: Return in about 8 weeks (around 01/30/2024), or if symptoms worsen or fail to improve, for Depression, Anxiety.   Continue healthy lifestyle choices, including diet (rich in fruits, vegetables, and lean proteins, and low in salt and simple carbohydrates) and exercise (at least 30 minutes of moderate physical activity daily).  Educational handout given for palpitations   The above assessment and management plan was discussed with the patient. The patient verbalized understanding of and has agreed to the management plan. Patient is aware to call the clinic if they develop any new symptoms or if symptoms persist or worsen. Patient is aware when to return to the clinic for a follow-up visit. Patient educated on when it is appropriate to go to the emergency department.   Rosaline Bruns, FNP-C [facility] [phone]

## 2023-12-15 ENCOUNTER — Other Ambulatory Visit (INDEPENDENT_AMBULATORY_CARE_PROVIDER_SITE_OTHER)

## 2023-12-15 ENCOUNTER — Ambulatory Visit

## 2023-12-15 DIAGNOSIS — R002 Palpitations: Secondary | ICD-10-CM

## 2023-12-15 DIAGNOSIS — R5383 Other fatigue: Secondary | ICD-10-CM | POA: Diagnosis not present

## 2023-12-15 DIAGNOSIS — R5381 Other malaise: Secondary | ICD-10-CM

## 2023-12-15 NOTE — Progress Notes (Signed)
 Patient presented to office today to have a Zio applied. Patient tolerated well and verbalized understanding of instructions

## 2023-12-20 LAB — ANEMIA PROFILE B
Basophils Absolute: 0 x10E3/uL (ref 0.0–0.2)
Basos: 1 %
EOS (ABSOLUTE): 0.1 x10E3/uL (ref 0.0–0.4)
Eos: 2 %
Ferritin: 16 ng/mL (ref 15–150)
Folate: 8.7 ng/mL (ref >3.0)
Hematocrit: 38.8 % (ref 34.0–46.6)
Hemoglobin: 12.5 g/dL (ref 11.1–15.9)
Immature Grans (Abs): 0 x10E3/uL (ref 0.0–0.1)
Immature Granulocytes: 0 %
Iron Saturation: 10 % — ABNORMAL LOW (ref 15–55)
Iron: 40 ug/dL (ref 27–159)
Lymphocytes Absolute: 1.8 x10E3/uL (ref 0.7–3.1)
Lymphs: 23 %
MCH: 30 pg (ref 26.6–33.0)
MCHC: 32.2 g/dL (ref 31.5–35.7)
MCV: 93 fL (ref 79–97)
Monocytes Absolute: 0.5 x10E3/uL (ref 0.1–0.9)
Monocytes: 7 %
Neutrophils Absolute: 5.2 x10E3/uL (ref 1.4–7.0)
Neutrophils: 67 %
Platelets: 290 x10E3/uL (ref 150–450)
RBC: 4.16 x10E6/uL (ref 3.77–5.28)
RDW: 13.2 % (ref 11.7–15.4)
Retic Ct Pct: 2 % (ref 0.6–2.6)
Total Iron Binding Capacity: 415 ug/dL (ref 250–450)
UIBC: 375 ug/dL (ref 131–425)
Vitamin B-12: 318 pg/mL (ref 232–1245)
WBC: 7.7 x10E3/uL (ref 3.4–10.8)

## 2023-12-20 LAB — CMP14+EGFR
ALT: 26 IU/L (ref 0–32)
AST: 20 IU/L (ref 0–40)
Albumin: 4.5 g/dL (ref 3.9–4.9)
Alkaline Phosphatase: 95 IU/L (ref 44–121)
BUN/Creatinine Ratio: 16 (ref 9–23)
BUN: 12 mg/dL (ref 6–24)
Bilirubin Total: 0.4 mg/dL (ref 0.0–1.2)
CO2: 21 mmol/L (ref 20–29)
Calcium: 9.3 mg/dL (ref 8.7–10.2)
Chloride: 102 mmol/L (ref 96–106)
Creatinine, Ser: 0.74 mg/dL (ref 0.57–1.00)
Globulin, Total: 2.4 g/dL (ref 1.5–4.5)
Glucose: 102 mg/dL — ABNORMAL HIGH (ref 70–99)
Potassium: 5 mmol/L (ref 3.5–5.2)
Sodium: 138 mmol/L (ref 134–144)
Total Protein: 6.9 g/dL (ref 6.0–8.5)
eGFR: 103 mL/min/1.73 (ref >59)

## 2023-12-20 LAB — HORMONE PANEL (T4,TSH,FSH,TESTT,SHBG,DHEA,ETC)
DHEA-Sulfate, LCMS: 39 ug/dL
Estradiol, Serum, MS: 174 pg/mL
Estrone Sulfate: 195 ng/dL
Follicle Stimulating Hormone: 5.8 m[IU]/mL
Free T-3: 2.8 pg/mL
Free Testosterone, Serum: 3.5 pg/mL
Progesterone, Serum: 295 ng/dL
Sex Hormone Binding Globulin: 80.6 nmol/L
T4: 8.3 ug/dL
TSH: 0.79 uU/mL
Testosterone, Serum (Total): 50 ng/dL
Testosterone-% Free: 0.7 %
Triiodothyronine (T-3), Serum: 98 ng/dL

## 2023-12-20 LAB — LIPID PANEL
Chol/HDL Ratio: 4.1 ratio (ref 0.0–4.4)
Cholesterol, Total: 223 mg/dL — ABNORMAL HIGH (ref 100–199)
HDL: 55 mg/dL (ref >39)
LDL Chol Calc (NIH): 143 mg/dL — ABNORMAL HIGH (ref 0–99)
Triglycerides: 140 mg/dL (ref 0–149)
VLDL Cholesterol Cal: 25 mg/dL (ref 5–40)

## 2023-12-20 LAB — VITAMIN D 25 HYDROXY (VIT D DEFICIENCY, FRACTURES): Vit D, 25-Hydroxy: 52.2 ng/mL (ref 30.0–100.0)

## 2024-01-13 ENCOUNTER — Encounter: Payer: Self-pay | Admitting: Family Medicine

## 2024-01-20 ENCOUNTER — Other Ambulatory Visit: Payer: Self-pay | Admitting: Family Medicine

## 2024-01-20 DIAGNOSIS — F339 Major depressive disorder, recurrent, unspecified: Secondary | ICD-10-CM

## 2024-01-20 DIAGNOSIS — F419 Anxiety disorder, unspecified: Secondary | ICD-10-CM

## 2024-01-23 ENCOUNTER — Encounter: Payer: Self-pay | Admitting: Internal Medicine

## 2024-01-26 ENCOUNTER — Ambulatory Visit: Payer: Self-pay | Attending: Internal Medicine | Admitting: Internal Medicine

## 2024-01-26 ENCOUNTER — Other Ambulatory Visit: Payer: Self-pay | Admitting: Family Medicine

## 2024-01-26 ENCOUNTER — Encounter: Payer: Self-pay | Admitting: Internal Medicine

## 2024-01-26 VITALS — BP 128/82 | HR 67 | Ht 62.0 in | Wt 191.8 lb

## 2024-01-26 DIAGNOSIS — R079 Chest pain, unspecified: Secondary | ICD-10-CM | POA: Diagnosis not present

## 2024-01-26 DIAGNOSIS — F339 Major depressive disorder, recurrent, unspecified: Secondary | ICD-10-CM

## 2024-01-26 DIAGNOSIS — R0602 Shortness of breath: Secondary | ICD-10-CM | POA: Diagnosis not present

## 2024-01-26 DIAGNOSIS — F419 Anxiety disorder, unspecified: Secondary | ICD-10-CM

## 2024-01-26 MED ORDER — METOPROLOL SUCCINATE ER 25 MG PO TB24: 25.0000 mg | ORAL_TABLET | Freq: Every day | ORAL | 3 refills | Status: AC

## 2024-01-26 NOTE — Patient Instructions (Addendum)
 Medication Instructions:  Your physician has recommended you make the following change in your medication:  Start Toprol  XL 25mg  daily.  Lab Work: None ordered.  You may go to any [facility] Location for your lab work:  [facility] - [address] ([facility]) - [address] [phone] [address]  [place] - [address]   [place]  - [address]   [place] - [address] - [address] [employer]  - [address] [place] - [address] ([facility]   If you have labs (blood work) drawn today and your tests are completely normal, you will receive your results only by: [employer] (if you have MyChart)  If you have any lab test that is abnormal or we need to change your treatment, we will call you or send a MyChart message to review the results.  Testing/Procedures: Your physician has requested that you have an echocardiogram. Echocardiography is a painless test that uses sound waves to create images of your heart. It provides your doctor with information about the size and shape of your heart and how well your heart's chambers and valves are working. This procedure takes approximately one hour. There are no restrictions for this procedure. Please do NOT wear cologne, perfume, aftershave, or lotions (deodorant is allowed). Please arrive 15 minutes prior to your appointment time.  Please note: We ask at that you not bring children with you during ultrasound (echo/ vascular) testing. Due to room size and safety concerns, children are not allowed in the ultrasound rooms during exams. Our front office staff cannot provide observation of children in our lobby area while testing is being conducted. An adult accompanying a patient to their appointment will only be allowed in the ultrasound room at the discretion of the ultrasound technician under special circumstances. We  apologize for any inconvenience.   Follow-Up: At [facility], you and your health needs are our priority.  As part of our continuing mission to provide you with exceptional heart care, we have created designated Provider Care Teams.  These Care Teams include your primary Cardiologist (physician) and Advanced Practice Providers (APPs -  Physician Assistants and Nurse Practitioners) who all work together to provide you with the care you need, when you need it.  Your next appointment:   To be determined after testing

## 2024-01-26 NOTE — Progress Notes (Signed)
 HPI Kelly Jennings returns today for followup. She is a pleasant 43 yo woman with a h/o unexplained syncope, thought due to autonomic dysfunction, who I saw last 11 years ago. In the interim, she has continued to have additional episodes of syncope, last about 2 months ago. She describes increased stress at work, especially in the past several weeks. She c/o sob and chest pressure and palpitations. She had a CT 2 years ago which showed no calcium and 25-49% narrowing.  She notes non-exertional chest pain, arm pain, and neck pain. She is still able to do strenuous work in her yard. Because of the stress she is considering quitting her job.  Allergies  Allergen Reactions   Penicillins Rash    Rash as an infant     Current Outpatient Medications  Medication Sig Dispense Refill   cyclobenzaprine  (FLEXERIL ) 5 MG tablet Take 1 tablet (5 mg total) by mouth 3 (three) times daily as needed for muscle spasms. 30 tablet 1   FLUoxetine  (PROZAC ) 20 MG capsule TAKE 1 CAPSULE BY MOUTH EVERY DAY 90 capsule 0   furosemide  (LASIX ) 20 MG tablet Take 1-2 tablets (20-40 mg total) by mouth daily. 180 tablet 1   hydrOXYzine  (ATARAX ) 10 MG tablet TAKE 1 TO 2 TABLETS (10 TO 20MG  TOTAL) BY MOUTH 3 TIMES A DAY AS NEEDED FOR ANXIETY 540 tablet 0   lisinopril  (ZESTRIL ) 40 MG tablet Take 1 tablet (40 mg total) by mouth daily. 90 tablet 1   metoprolol  succinate (TOPROL  XL) 25 MG 24 hr tablet Take 1 tablet (25 mg total) by mouth daily. 90 tablet 3   ondansetron  (ZOFRAN -ODT) 4 MG disintegrating tablet Take 1 tablet (4 mg total) by mouth every 8 (eight) hours as needed for nausea or vomiting. 20 tablet 0   pantoprazole  (PROTONIX ) 20 MG tablet Take 1 tablet (20 mg total) by mouth daily. 90 tablet 1   No current facility-administered medications for this visit.     Past Medical History:  Diagnosis Date   GERD (gastroesophageal reflux disease)    Hypertension    Migraines     ROS:   All systems reviewed and  negative except as noted in the HPI.   Past Surgical History:  Procedure Laterality Date   CHOLECYSTECTOMY       Family History  Problem Relation Age of Onset   Hyperlipidemia Mother    Hypertension Mother    Hyperlipidemia Father    Hypertension Father    Diabetes Father      Social History   Socioeconomic History   Marital status: Married    Spouse name: Not on file   Number of children: 2   Years of education: Not on file   Highest education level: Not on file  Occupational History   Not on file  Tobacco Use   Smoking status: Never   Smokeless tobacco: Never  Vaping Use   Vaping status: Never Used  Substance and Sexual Activity   Alcohol use: No   Drug use: No   Sexual activity: Yes  Other Topics Concern   Not on file  Social History Narrative   Not on file   Social Drivers of Health   Financial Resource Strain: Not on file  Food Insecurity: Not on file  Transportation Needs: Not on file  Physical Activity: Not on file  Stress: Not on file  Social Connections: Not on file  Intimate Partner Violence: Not on file     BP 128/82  Pulse 67   Ht 5' 2 (1.575 m)   Wt 191 lb 12.8 oz (87 kg)   SpO2 97%   BMI 35.08 kg/m   Physical Exam:  Well appearing NAD HEENT: Unremarkable Neck:  No JVD, no thyromegally Lymphatics:  No adenopathy Back:  No CVA tenderness Lungs:  Clear with no wheezes HEART:  Regular rate rhythm, no murmurs, no rubs, no clicks Abd:  soft, positive bowel sounds, no organomegally, no rebound, no guarding Ext:  2 plus pulses, no edema, no cyanosis, no clubbing Skin:  No rashes no nodules Neuro:  CN II through XII intact, motor grossly intact  EKG - nsr with no STT changes.  Assess/Plan: Autonomic dysfunction - she still has it but no real worsening. Non-cardiac chest pain - we will obtain a 2D echo. If EF is down or segmental wall motion abnormalities, then left heart cath. If echo EF is normal, would have her undergo exercise  myoview or PET.  NSVT - unclear if symptomatic. We will start toprol  xl 25  mg daily.  Kelly Zlaty Alexa,MD

## 2024-01-30 ENCOUNTER — Ambulatory Visit (INDEPENDENT_AMBULATORY_CARE_PROVIDER_SITE_OTHER): Admitting: Family Medicine

## 2024-01-30 ENCOUNTER — Encounter: Payer: Self-pay | Admitting: Family Medicine

## 2024-01-30 VITALS — BP 121/82 | HR 67 | Temp 97.3°F | Ht 62.0 in | Wt 191.0 lb

## 2024-01-30 DIAGNOSIS — F419 Anxiety disorder, unspecified: Secondary | ICD-10-CM | POA: Diagnosis not present

## 2024-01-30 DIAGNOSIS — R002 Palpitations: Secondary | ICD-10-CM

## 2024-01-30 DIAGNOSIS — F339 Major depressive disorder, recurrent, unspecified: Secondary | ICD-10-CM

## 2024-01-30 NOTE — Patient Instructions (Signed)
 If your symptoms worsen or you have thoughts of suicide/homicide, PLEASE SEEK IMMEDIATE MEDICAL ATTENTION.  You may always call the National Suicide Hotline.  This is available 24 hours a day, 7 days a week.  Their number is: 5810725763  Taking the medicine as directed and not missing any doses is one of the best things you can do to treat your depression.  Here are some things to keep in mind:  Side effects (stomach upset, some increased anxiety) may happen before you notice a benefit.  These side effects typically go away over time. Changes to your dose of medicine or a change in medication all together is sometimes necessary Most people need to be on medication at least 12 months Many people will notice an improvement within two weeks but the full effect of the medication can take up to 4-6 weeks Stopping the medication when you start feeling better often results in a return of symptoms Never discontinue your medication without contacting a health care professional first.  Some medications require gradual discontinuation/ taper and can make you sick if you stop them abruptly.  If your symptoms worsen or you have thoughts of suicide/homicide, PLEASE SEEK IMMEDIATE MEDICAL ATTENTION.  You may always call:  National Suicide Hotline: 440-761-4568 [place] Crisis Line: (424)588-3357 Crisis Recovery in [place]: 331-882-2643  These are available 24 hours a day, 7 days a week.

## 2024-01-30 NOTE — Progress Notes (Signed)
 Subjective:  Patient ID: Kelly Jennings, female    DOB: 1980-09-15, 43 y.o.   MRN: [id number]  Patient Care Team: Severa Rock HERO, FNP as PCP - General (Family Medicine) Alvan Ronal BRAVO, MD (Inactive) as PCP - Cardiology (Cardiology)   Chief Complaint:  Discuss meds   HPI: Kelly Jennings is a 43 y.o. female presenting on 01/30/2024 for Discuss meds   Kelly Jennings is a 43 year old female who presents with concerns about her heart health and worsening depression and anxiety.  She recently received concerning results from a heart monitor, prompting a cardiology consultation. An echocardiogram is scheduled for February 06, 2024. She has been prescribed metoprolol , which she takes at night.  She is currently taking Prozac  for depression and anxiety but feels her symptoms have worsened, attributing this to stress from her job, which she describes as a negative environment. She is concerned about potential interactions between Prozac  and metoprolol . Significant stress is related to her job, stating 'I just hate it there' and 'my boss don't like me.' She is worried about job security due to using up all her PTO for medical appointments and fears her employer may try to terminate her employment. She is considering leaving her job.        01/30/2024   10:03 AM 12/05/2023    8:48 AM 08/29/2023    2:39 PM 07/14/2023    8:11 AM 10/25/2022    8:35 AM  Depression screen PHQ 2/9  Decreased Interest 2 2 1 2  0  Down, Depressed, Hopeless 2 1 1 3  0  PHQ - 2 Score 4 3 2 5  0  Altered sleeping 2 2 2 1 3   Tired, decreased energy 2 3 2 1  0  Change in appetite 2 1 1  0 3  Feeling bad or failure about yourself  2 1 1 2  0  Trouble concentrating 3 3 3 2 3   Moving slowly or fidgety/restless 2 1 3 2 3   Suicidal thoughts 0 0 0 0 0  PHQ-9 Score 17 14 14 13 12   Difficult doing work/chores Extremely dIfficult Very difficult  Not difficult at all Somewhat difficult      01/30/2024   10:03 AM 12/05/2023    8:48  AM 08/29/2023    2:40 PM 07/14/2023    8:11 AM  GAD 7 : Generalized Anxiety Score  Nervous, Anxious, on Edge 3 3 3 3   Control/stop worrying 3 3 3 3   Worry too much - different things 3 3 3 3   Trouble relaxing 3 3 3 3   Restless 3 3 3 3   Easily annoyed or irritable 3 3 3 3   Afraid - awful might happen 3 3 3 3   Total GAD 7 Score 21 21 21 21   Anxiety Difficulty Not difficult at all Not difficult at all  Not difficult at all         Relevant past medical, surgical, family, and social history reviewed and updated as indicated.  Allergies and medications reviewed and updated. Data reviewed: Chart in Epic.   Past Medical History:  Diagnosis Date   GERD (gastroesophageal reflux disease)    Hypertension    Migraines     Past Surgical History:  Procedure Laterality Date   CHOLECYSTECTOMY      Social History   Socioeconomic History   Marital status: Married    Spouse name: Not on file   Number of children: 2   Years of education: Not on file  Highest education level: Not on file  Occupational History   Not on file  Tobacco Use   Smoking status: Never   Smokeless tobacco: Never  Vaping Use   Vaping status: Never Used  Substance and Sexual Activity   Alcohol use: No   Drug use: No   Sexual activity: Yes  Other Topics Concern   Not on file  Social History Narrative   Not on file   Social Drivers of Health   Financial Resource Strain: Not on file  Food Insecurity: Not on file  Transportation Needs: Not on file  Physical Activity: Not on file  Stress: Not on file  Social Connections: Not on file  Intimate Partner Violence: Not on file    Outpatient Encounter Medications as of 01/30/2024  Medication Sig   cyclobenzaprine  (FLEXERIL ) 5 MG tablet Take 1 tablet (5 mg total) by mouth 3 (three) times daily as needed for muscle spasms.   FLUoxetine  (PROZAC ) 20 MG capsule TAKE 1 CAPSULE BY MOUTH EVERY DAY   furosemide  (LASIX ) 20 MG tablet Take 1-2 tablets (20-40 mg total)  by mouth daily.   hydrOXYzine  (ATARAX ) 10 MG tablet TAKE 1 TO 2 TABLETS (10 TO 20MG  TOTAL) BY MOUTH 3 TIMES A DAY AS NEEDED FOR ANXIETY   lisinopril  (ZESTRIL ) 40 MG tablet Take 1 tablet (40 mg total) by mouth daily.   metoprolol  succinate (TOPROL  XL) 25 MG 24 hr tablet Take 1 tablet (25 mg total) by mouth daily.   ondansetron  (ZOFRAN -ODT) 4 MG disintegrating tablet Take 1 tablet (4 mg total) by mouth every 8 (eight) hours as needed for nausea or vomiting.   pantoprazole  (PROTONIX ) 20 MG tablet Take 1 tablet (20 mg total) by mouth daily.   No facility-administered encounter medications on file as of 01/30/2024.    Allergies  Allergen Reactions   Penicillins Rash    Rash as an infant    Pertinent ROS per HPI, otherwise unremarkable      Objective:  BP 121/82   Pulse 67   Temp (!) 97.3 F (36.3 C) (Temporal)   Ht 5' 2 (1.575 m)   Wt 191 lb (86.6 kg)   SpO2 94%   BMI 34.93 kg/m    Wt Readings from Last 3 Encounters:  01/30/24 191 lb (86.6 kg)  01/26/24 191 lb 12.8 oz (87 kg)  12/05/23 194 lb 3.2 oz (88.1 kg)    Physical Exam Vitals and nursing note reviewed.  Constitutional:      General: She is not in acute distress.    Appearance: Normal appearance. She is obese. She is not ill-appearing, toxic-appearing or diaphoretic.  HENT:     Head: Normocephalic and atraumatic.     Nose: Nose normal.  Eyes:     Pupils: Pupils are equal, round, and reactive to light.  Cardiovascular:     Rate and Rhythm: Normal rate and regular rhythm.     Heart sounds: Normal heart sounds.  Pulmonary:     Effort: Pulmonary effort is normal.     Breath sounds: Normal breath sounds.  Musculoskeletal:     Cervical back: Neck supple.     Right lower leg: No edema.     Left lower leg: No edema.  Skin:    General: Skin is warm and dry.     Capillary Refill: Capillary refill takes less than 2 seconds.  Neurological:     General: No focal deficit present.     Mental Status: She is alert and  oriented to  person, place, and time.  Psychiatric:        Mood and Affect: Mood normal.        Behavior: Behavior normal.        Thought Content: Thought content normal.        Judgment: Judgment normal.      Results for orders placed or performed in visit on 12/05/23  Bayer DCA Hb A1c Waived   Collection Time: 12/05/23  9:26 AM  Result Value Ref Range   HB A1C (BAYER DCA - WAIVED) 5.4 4.8 - 5.6 %  Lipid panel   Collection Time: 12/05/23  9:33 AM  Result Value Ref Range   Cholesterol, Total 223 (H) 100 - 199 mg/dL   Triglycerides 859 0 - 149 mg/dL   HDL 55 >60 mg/dL   VLDL Cholesterol Cal 25 5 - 40 mg/dL   LDL Chol Calc (NIH) 856 (H) 0 - 99 mg/dL   Chol/HDL Ratio 4.1 0.0 - 4.4 ratio  CMP14+EGFR   Collection Time: 12/05/23  9:33 AM  Result Value Ref Range   Glucose 102 (H) 70 - 99 mg/dL   BUN 12 6 - 24 mg/dL   Creatinine, Ser 9.25 0.57 - 1.00 mg/dL   eGFR 896 >40 fO/fpw/8.26   BUN/Creatinine Ratio 16 9 - 23   Sodium 138 134 - 144 mmol/L   Potassium 5.0 3.5 - 5.2 mmol/L   Chloride 102 96 - 106 mmol/L   CO2 21 20 - 29 mmol/L   Calcium 9.3 8.7 - 10.2 mg/dL   Total Protein 6.9 6.0 - 8.5 g/dL   Albumin 4.5 3.9 - 4.9 g/dL   Globulin, Total 2.4 1.5 - 4.5 g/dL   Bilirubin Total 0.4 0.0 - 1.2 mg/dL   Alkaline Phosphatase 95 44 - 121 IU/L   AST 20 0 - 40 IU/L   ALT 26 0 - 32 IU/L  Vitamin D , 25-hydroxy   Collection Time: 12/05/23  9:33 AM  Result Value Ref Range   Vit D, 25-Hydroxy 52.2 30.0 - 100.0 ng/mL  Anemia Profile B   Collection Time: 12/05/23  9:33 AM  Result Value Ref Range   Total Iron Binding Capacity 415 250 - 450 ug/dL   UIBC 624 868 - 574 ug/dL   Iron 40 27 - 840 ug/dL   Iron Saturation 10 (L) 15 - 55 %   Ferritin 16 15 - 150 ng/mL   Vitamin B-12 318 232 - 1,245 pg/mL   Folate 8.7 >3.0 ng/mL   WBC 7.7 3.4 - 10.8 x10E3/uL   RBC 4.16 3.77 - 5.28 x10E6/uL   Hemoglobin 12.5 11.1 - 15.9 g/dL   Hematocrit 61.1 65.9 - 46.6 %   MCV 93 79 - 97 fL   MCH 30.0  26.6 - 33.0 pg   MCHC 32.2 31.5 - 35.7 g/dL   RDW 86.7 88.2 - 84.5 %   Platelets 290 150 - 450 x10E3/uL   Neutrophils 67 Not Estab. %   Lymphs 23 Not Estab. %   Monocytes 7 Not Estab. %   Eos 2 Not Estab. %   Basos 1 Not Estab. %   Neutrophils Absolute 5.2 1.4 - 7.0 x10E3/uL   Lymphocytes Absolute 1.8 0.7 - 3.1 x10E3/uL   Monocytes Absolute 0.5 0.1 - 0.9 x10E3/uL   EOS (ABSOLUTE) 0.1 0.0 - 0.4 x10E3/uL   Basophils Absolute 0.0 0.0 - 0.2 x10E3/uL   Immature Granulocytes 0 Not Estab. %   Immature Grans (Abs) 0.0 0.0 - 0.1 x10E3/uL  Retic Ct Pct 2.0 0.6 - 2.6 %  Hormone Panel   Collection Time: 12/05/23  9:33 AM  Result Value Ref Range   TSH 0.79 uU/mL   T4 8.3 ug/dL   Free T-3 2.8 pg/mL   Triiodothyronine (T-3), Serum 98 ng/dL   Testosterone, Serum (Total) 50 ng/dL   Free Testosterone, Serum 3.5 pg/mL   Testosterone-% Free 0.7 %   Follicle Stimulating Hormone 5.8 mIU/mL   Progesterone, Serum 295 ng/dL   DHEA-Sulfate, LCMS 39 ug/dL   Sex Hormone Binding Globulin 80.6 nmol/L   Estrone Sulfate 195 ng/dL   Estradiol , Serum, MS 174 pg/mL       Pertinent labs & imaging results that were available during my care of the patient were reviewed by me and considered in my medical decision making.  Assessment & Plan:  Rejoice was seen today for discuss meds.  Diagnoses and all orders for this visit:  Depression, recurrent  Anxiety  Palpitations      Palpitations Palpitations with abnormal heart monitor results, under cardiologist evaluation. - Continue metoprolol  as prescribed by cardiologist, noting potential fatigue as a side effect. - Schedule and complete echocardiogram on October 14th. - Administer metoprolol  at night and Prozac  in the morning to avoid interaction.  Depression and anxiety Worsening depression and anxiety symptoms, potentially exacerbated by job stress, with current treatment of Prozac . Symptoms may contribute to palpitations. - Continue current Prozac   regimen. - Re-evaluate medications post-cardiac evaluation and follow-up with cardiologist. - Consider FMLA paperwork for medical appointments.          Continue all other maintenance medications.  Follow up plan: Return if symptoms worsen or fail to improve.   Continue healthy lifestyle choices, including diet (rich in fruits, vegetables, and lean proteins, and low in salt and simple carbohydrates) and exercise (at least 30 minutes of moderate physical activity daily).  Educational handout given for depression   The above assessment and management plan was discussed with the patient. The patient verbalized understanding of and has agreed to the management plan. Patient is aware to call the clinic if they develop any new symptoms or if symptoms persist or worsen. Patient is aware when to return to the clinic for a follow-up visit. Patient educated on when it is appropriate to go to the emergency department.   Rosaline Bruns, FNP-C [facility] [phone]

## 2024-02-06 ENCOUNTER — Ambulatory Visit (HOSPITAL_COMMUNITY)
Admission: RE | Admit: 2024-02-06 | Discharge: 2024-02-06 | Disposition: A | Discharge status: Home / Self Care | Source: Ambulatory Visit | Attending: Internal Medicine | Admitting: Internal Medicine

## 2024-02-06 DIAGNOSIS — R079 Chest pain, unspecified: Secondary | ICD-10-CM | POA: Insufficient documentation

## 2024-02-06 DIAGNOSIS — R0602 Shortness of breath: Secondary | ICD-10-CM | POA: Diagnosis present

## 2024-02-06 LAB — ECHOCARDIOGRAM COMPLETE
Area-P 1/2: 2.95 cm2
S' Lateral: 2.5 cm

## 2024-02-07 ENCOUNTER — Ambulatory Visit: Admitting: Family Medicine

## 2024-02-07 ENCOUNTER — Encounter: Payer: Self-pay | Admitting: Family Medicine

## 2024-02-07 VITALS — BP 114/77 | HR 73 | Temp 97.7°F | Ht 62.0 in | Wt 197.8 lb

## 2024-02-07 DIAGNOSIS — L0291 Cutaneous abscess, unspecified: Secondary | ICD-10-CM | POA: Diagnosis not present

## 2024-02-07 DIAGNOSIS — Z8619 Personal history of other infectious and parasitic diseases: Secondary | ICD-10-CM | POA: Diagnosis not present

## 2024-02-07 MED ORDER — SULFAMETHOXAZOLE-TRIMETHOPRIM 800-160 MG PO TABS: 1.0000 | ORAL_TABLET | Freq: Two times a day (BID) | ORAL | 0 refills | Status: AC

## 2024-02-07 MED ORDER — FLUCONAZOLE 150 MG PO TABS: 150.0000 mg | ORAL_TABLET | Freq: Once | ORAL | 0 refills | Status: AC

## 2024-02-07 NOTE — Progress Notes (Signed)
 Subjective:  Patient ID: Kelly Jennings, female    DOB: Oct 25, 1980, 43 y.o.   MRN: [id number]  Patient Care Team: Severa Rock HERO, FNP as PCP - General (Family Medicine) Alvan Ronal BRAVO, MD (Inactive) as PCP - Cardiology (Cardiology)   Chief Complaint:  Recurrent Skin Infections (Boil on abdomen- states that she was seen for this 2 months ago and that it is still there and worse. Painful )   HPI: Kelly Jennings is a 43 y.o. female presenting on 02/07/2024 for Recurrent Skin Infections (Boil on abdomen- states that she was seen for this 2 months ago and that it is still there and worse. Painful )   Kelly Jennings is a 43 year old female who presents with a painful abdominal abscess.  She has a worsening lesion on her abdomen, located in a stretch mark area, which is painful and tender. The lesion feels as though it might 'rip open' if not addressed. It is tender to touch.  She has been wearing stretchy pants that rubbed against the lesion, leading her to switch to wearing blue jeans folded down to avoid irritation. Despite these changes, the lesion remains painful.  She has been applying warm compresses to the lesion, but there has been no significant change in its condition. Occasionally, the lesion dries up with some skin flaking, but overall, it remains unchanged.  She is allergic to penicillins and inquires about the use of Bactrim for treatment.  No fever or other systemic symptoms.          Relevant past medical, surgical, family, and social history reviewed and updated as indicated.  Allergies and medications reviewed and updated. Data reviewed: Chart in Epic.   Past Medical History:  Diagnosis Date   GERD (gastroesophageal reflux disease)    Hypertension    Migraines     Past Surgical History:  Procedure Laterality Date   CHOLECYSTECTOMY      Social History   Socioeconomic History   Marital status: Married    Spouse name: Not on file   Number of  children: 2   Years of education: Not on file   Highest education level: Not on file  Occupational History   Not on file  Tobacco Use   Smoking status: Never   Smokeless tobacco: Never  Vaping Use   Vaping status: Never Used  Substance and Sexual Activity   Alcohol use: No   Drug use: No   Sexual activity: Yes  Other Topics Concern   Not on file  Social History Narrative   Not on file   Social Drivers of Health   Financial Resource Strain: Not on file  Food Insecurity: Not on file  Transportation Needs: Not on file  Physical Activity: Not on file  Stress: Not on file  Social Connections: Not on file  Intimate Partner Violence: Not on file    Outpatient Encounter Medications as of 02/07/2024  Medication Sig   cyclobenzaprine  (FLEXERIL ) 5 MG tablet Take 1 tablet (5 mg total) by mouth 3 (three) times daily as needed for muscle spasms.   fluconazole  (DIFLUCAN ) 150 MG tablet Take 1 tablet (150 mg total) by mouth once for 1 dose.   FLUoxetine  (PROZAC ) 20 MG capsule TAKE 1 CAPSULE BY MOUTH EVERY DAY   furosemide  (LASIX ) 20 MG tablet Take 1-2 tablets (20-40 mg total) by mouth daily.   hydrOXYzine  (ATARAX ) 10 MG tablet TAKE 1 TO 2 TABLETS (10 TO 20MG  TOTAL) BY MOUTH 3  TIMES A DAY AS NEEDED FOR ANXIETY   lisinopril  (ZESTRIL ) 40 MG tablet Take 1 tablet (40 mg total) by mouth daily.   metoprolol  succinate (TOPROL  XL) 25 MG 24 hr tablet Take 1 tablet (25 mg total) by mouth daily.   ondansetron  (ZOFRAN -ODT) 4 MG disintegrating tablet Take 1 tablet (4 mg total) by mouth every 8 (eight) hours as needed for nausea or vomiting.   pantoprazole  (PROTONIX ) 20 MG tablet Take 1 tablet (20 mg total) by mouth daily.   sulfamethoxazole-trimethoprim (BACTRIM DS) 800-160 MG tablet Take 1 tablet by mouth 2 (two) times daily for 10 days.   No facility-administered encounter medications on file as of 02/07/2024.    Allergies  Allergen Reactions   Penicillins Rash    Rash as an infant     Pertinent ROS per HPI, otherwise unremarkable      Objective:  BP 114/77   Pulse 73   Temp 97.7 F (36.5 C)   Ht 5' 2 (1.575 m)   Wt 197 lb 12.8 oz (89.7 kg)   SpO2 97%   BMI 36.18 kg/m    Wt Readings from Last 3 Encounters:  02/07/24 197 lb 12.8 oz (89.7 kg)  01/30/24 191 lb (86.6 kg)  01/26/24 191 lb 12.8 oz (87 kg)    Physical Exam Vitals and nursing note reviewed.  Constitutional:      General: She is not in acute distress.    Appearance: Normal appearance. She is obese. She is not ill-appearing, toxic-appearing or diaphoretic.  HENT:     Head: Normocephalic and atraumatic.     Mouth/Throat:     Mouth: Mucous membranes are moist.  Eyes:     Pupils: Pupils are equal, round, and reactive to light.  Cardiovascular:     Rate and Rhythm: Normal rate and regular rhythm.     Heart sounds: Normal heart sounds.  Pulmonary:     Effort: Pulmonary effort is normal.     Breath sounds: Normal breath sounds.  Musculoskeletal:     Cervical back: Neck supple.  Skin:    General: Skin is warm and dry.     Capillary Refill: Capillary refill takes less than 2 seconds.     Findings: Abscess present.      Neurological:     General: No focal deficit present.     Mental Status: She is alert and oriented to person, place, and time.  Psychiatric:        Mood and Affect: Mood normal.        Behavior: Behavior normal.        Thought Content: Thought content normal.        Judgment: Judgment normal.     Results for orders placed or performed during the hospital encounter of 02/06/24  ECHOCARDIOGRAM COMPLETE   Collection Time: 02/06/24  1:29 PM  Result Value Ref Range   Area-P 1/2 2.95 cm2   S' Lateral 2.50 cm   Est EF 60 - 65%        Pertinent labs & imaging results that were available during my care of the patient were reviewed by me and considered in my medical decision making.  Assessment & Plan:  Marirose was seen today for recurrent skin infections.  Diagnoses  and all orders for this visit:  Abscess -     sulfamethoxazole-trimethoprim (BACTRIM DS) 800-160 MG tablet; Take 1 tablet by mouth 2 (two) times daily for 10 days.  History of candidiasis of vagina -  fluconazole  (DIFLUCAN ) 150 MG tablet; Take 1 tablet (150 mg total) by mouth once for 1 dose.       Abdominal skin abscess Abdominal skin abscess located in a stretch mark area, tender, not improved with warm compresses, and not ready for incision and drainage. Likely due to bacterial entry through worn-off skin. Allergic to penicillins but can tolerate Bactrim. - Prescribe Bactrim to be taken twice daily for ten days. - Advise warm compresses. - Instruct to wash the area twice daily with soap if it starts draining. - Prescribe Diflucan  for potential yeast infection, to be used only if symptoms develop, typically after completing antibiotics. - Advise to contact if condition worsens or no significant improvement by Monday. - Send prescriptions to [facility] in [place].          Continue all other maintenance medications.  Follow up plan: Return if symptoms worsen or fail to improve.   Continue healthy lifestyle choices, including diet (rich in fruits, vegetables, and lean proteins, and low in salt and simple carbohydrates) and exercise (at least 30 minutes of moderate physical activity daily).  Educational handout given for abscess  The above assessment and management plan was discussed with the patient. The patient verbalized understanding of and has agreed to the management plan. Patient is aware to call the clinic if they develop any new symptoms or if symptoms persist or worsen. Patient is aware when to return to the clinic for a follow-up visit. Patient educated on when it is appropriate to go to the emergency department.   Rosaline Bruns, FNP-C [facility] [phone]

## 2024-02-14 ENCOUNTER — Ambulatory Visit: Payer: Self-pay | Admitting: Internal Medicine

## 2024-02-20 ENCOUNTER — Encounter: Payer: Self-pay | Admitting: Family Medicine

## 2024-02-20 DIAGNOSIS — M7989 Other specified soft tissue disorders: Secondary | ICD-10-CM

## 2024-02-20 DIAGNOSIS — L0291 Cutaneous abscess, unspecified: Secondary | ICD-10-CM

## 2024-02-26 ENCOUNTER — Other Ambulatory Visit (HOSPITAL_COMMUNITY)

## 2024-03-05 ENCOUNTER — Ambulatory Visit (HOSPITAL_COMMUNITY): Attending: Family Medicine

## 2024-03-28 ENCOUNTER — Encounter: Payer: Self-pay | Admitting: Family Medicine

## 2024-03-28 ENCOUNTER — Ambulatory Visit: Payer: Self-pay | Admitting: Family Medicine

## 2024-03-28 VITALS — BP 123/78 | HR 74 | Temp 98.4°F | Ht 62.0 in | Wt 201.0 lb

## 2024-03-28 DIAGNOSIS — F339 Major depressive disorder, recurrent, unspecified: Secondary | ICD-10-CM

## 2024-03-28 DIAGNOSIS — F419 Anxiety disorder, unspecified: Secondary | ICD-10-CM

## 2024-03-28 DIAGNOSIS — I10 Essential (primary) hypertension: Secondary | ICD-10-CM

## 2024-03-28 DIAGNOSIS — R7303 Prediabetes: Secondary | ICD-10-CM

## 2024-03-28 DIAGNOSIS — G2581 Restless legs syndrome: Secondary | ICD-10-CM

## 2024-03-28 DIAGNOSIS — J069 Acute upper respiratory infection, unspecified: Secondary | ICD-10-CM

## 2024-03-28 MED ORDER — HYDROXYZINE HCL 10 MG PO TABS: 10.0000 mg | ORAL_TABLET | Freq: Three times a day (TID) | ORAL | 2 refills | Status: DC | PRN

## 2024-03-28 MED ORDER — DOXYCYCLINE HYCLATE 100 MG PO TABS: 100.0000 mg | ORAL_TABLET | Freq: Two times a day (BID) | ORAL | 0 refills | Status: AC

## 2024-03-28 MED ORDER — FLUTICASONE PROPIONATE 50 MCG/ACT NA SUSP: 2.0000 | Freq: Every day | NASAL | 6 refills | Status: DC

## 2024-03-28 MED ORDER — ROPINIROLE HCL 0.25 MG PO TABS: 0.2500 mg | ORAL_TABLET | Freq: Every day | ORAL | 0 refills | Status: DC

## 2024-03-28 MED ORDER — FLUOXETINE HCL 20 MG PO CAPS: 20.0000 mg | ORAL_CAPSULE | Freq: Every day | ORAL | 3 refills | Status: DC

## 2024-03-28 NOTE — Patient Instructions (Signed)
 [facility]

## 2024-03-28 NOTE — Progress Notes (Signed)
 Subjective:  Patient ID: Kelly Jennings, female    DOB: [birth year], 43 y.o.   MRN: [id number]  Patient Care Team: Severa Rock HERO, FNP as PCP - General (Family Medicine) Alvan Ronal BRAVO, MD (Inactive) as PCP - Cardiology (Cardiology)   Chief Complaint:  Medical Management of Chronic Issues, Cough, Nasal Congestion, and Ear Pain (Bilateral since nov 26th )   HPI: Kelly Jennings is a 43 y.o. female presenting on 03/28/2024 for Medical Management of Chronic Issues, Cough, Nasal Congestion, and Ear Pain (Bilateral since nov 26th )    Kelly Jennings is a 43 year old female who presents with a cough and symptoms of an upper respiratory infection.  She has been experiencing a cough with 'chunky' phlegm. She has been using over-the-counter medications, XD and SodaFab, which provide temporary relief, but her symptoms have been worsening overall.  She is currently taking fluoxetine  and hydroxyzine  for anxiety, which she finds effective. She requires refills for these medications.  She experiences symptoms of restless leg syndrome, including leg pain and involuntary movements at night, which disrupt her sleep. She previously took medication for this condition, which improved her symptoms, but she stopped taking it when her symptoms improved. She has tried over-the-counter remedies without success.  She recently quit her job due to workplace issues and is currently traveling with her husband, which she enjoys. She reports feeling happier and healthier since leaving her job. She does not have insurance currently and is considering using different pharmacies to manage medication costs.          Relevant past medical, surgical, family, and social history reviewed and updated as indicated.  Allergies and medications reviewed and updated. Data reviewed: Chart in Epic.   Past Medical History:  Diagnosis Date   GERD (gastroesophageal reflux disease)    Hypertension    Migraines     Past  Surgical History:  Procedure Laterality Date   CHOLECYSTECTOMY      Social History   Socioeconomic History   Marital status: Married    Spouse name: Not on file   Number of children: 2   Years of education: Not on file   Highest education level: Not on file  Occupational History   Not on file  Tobacco Use   Smoking status: Never   Smokeless tobacco: Never  Vaping Use   Vaping status: Never Used  Substance and Sexual Activity   Alcohol use: No   Drug use: No   Sexual activity: Yes  Other Topics Concern   Not on file  Social History Narrative   Not on file   Social Drivers of Health   Financial Resource Strain: Not on file  Food Insecurity: Not on file  Transportation Needs: Not on file  Physical Activity: Not on file  Stress: Not on file  Social Connections: Not on file  Intimate Partner Violence: Not on file    Outpatient Encounter Medications as of 03/28/2024  Medication Sig   doxycycline (VIBRA-TABS) 100 MG tablet Take 1 tablet (100 mg total) by mouth 2 (two) times daily for 7 days.   fluticasone (FLONASE) 50 MCG/ACT nasal spray Place 2 sprays into both nostrils daily.   furosemide  (LASIX ) 20 MG tablet Take 1-2 tablets (20-40 mg total) by mouth daily.   lisinopril  (ZESTRIL ) 40 MG tablet Take 1 tablet (40 mg total) by mouth daily.   metoprolol  succinate (TOPROL  XL) 25 MG 24 hr tablet Take 1 tablet (25 mg total) by mouth  daily.   ondansetron  (ZOFRAN -ODT) 4 MG disintegrating tablet Take 1 tablet (4 mg total) by mouth every 8 (eight) hours as needed for nausea or vomiting.   pantoprazole  (PROTONIX ) 20 MG tablet Take 1 tablet (20 mg total) by mouth daily.   rOPINIRole (REQUIP) 0.25 MG tablet Take 1 tablet (0.25 mg total) by mouth at bedtime.   FLUoxetine  (PROZAC ) 20 MG capsule Take 1 capsule (20 mg total) by mouth daily.   hydrOXYzine  (ATARAX ) 10 MG tablet Take 1 tablet (10 mg total) by mouth 3 (three) times daily as needed.   [DISCONTINUED] cyclobenzaprine  (FLEXERIL )  5 MG tablet Take 1 tablet (5 mg total) by mouth 3 (three) times daily as needed for muscle spasms.   [DISCONTINUED] FLUoxetine  (PROZAC ) 20 MG capsule TAKE 1 CAPSULE BY MOUTH EVERY DAY   [DISCONTINUED] hydrOXYzine  (ATARAX ) 10 MG tablet TAKE 1 TO 2 TABLETS (10 TO 20MG  TOTAL) BY MOUTH 3 TIMES A DAY AS NEEDED FOR ANXIETY   No facility-administered encounter medications on file as of 03/28/2024.    Allergies  Allergen Reactions   Penicillins Rash    Rash as an infant    Pertinent ROS per HPI, otherwise unremarkable      Objective:  BP 123/78   Pulse 74   Temp 98.4 F (36.9 C)   Ht 5' 2 (1.575 m)   Wt 201 lb (91.2 kg)   SpO2 96%   BMI 36.76 kg/m    Wt Readings from Last 3 Encounters:  03/28/24 201 lb (91.2 kg)  02/07/24 197 lb 12.8 oz (89.7 kg)  01/30/24 191 lb (86.6 kg)    Physical Exam Vitals and nursing note reviewed.  Constitutional:      General: She is not in acute distress.    Appearance: Normal appearance. She is obese. She is not ill-appearing, toxic-appearing or diaphoretic.  HENT:     Head: Normocephalic and atraumatic.     Right Ear: A middle ear effusion is present.     Left Ear: A middle ear effusion is present.     Nose: Congestion present.     Right Sinus: Maxillary sinus tenderness and frontal sinus tenderness present.     Left Sinus: Maxillary sinus tenderness and frontal sinus tenderness present.     Mouth/Throat:     Lips: Pink.     Mouth: Mucous membranes are moist.     Pharynx: Posterior oropharyngeal erythema and postnasal drip present. No oropharyngeal exudate.  Eyes:     Conjunctiva/sclera: Conjunctivae normal.     Pupils: Pupils are equal, round, and reactive to light.  Cardiovascular:     Rate and Rhythm: Normal rate and regular rhythm.     Heart sounds: Normal heart sounds.  Pulmonary:     Effort: Pulmonary effort is normal.     Breath sounds: Normal breath sounds.  Musculoskeletal:     Right lower leg: No edema.     Left lower leg:  No edema.  Skin:    General: Skin is warm and dry.     Capillary Refill: Capillary refill takes less than 2 seconds.  Neurological:     General: No focal deficit present.     Mental Status: She is alert and oriented to person, place, and time.  Psychiatric:        Mood and Affect: Mood normal.        Behavior: Behavior normal. Behavior is cooperative.        Thought Content: Thought content normal.  Judgment: Judgment normal.           Results for orders placed or performed during the hospital encounter of 02/06/24  ECHOCARDIOGRAM COMPLETE   Collection Time: 02/06/24  1:29 PM  Result Value Ref Range   Area-P 1/2 2.95 cm2   S' Lateral 2.50 cm   Est EF 60 - 65%        Pertinent labs & imaging results that were available during my care of the patient were reviewed by me and considered in my medical decision making.  Assessment & Plan:  Verlena was seen today for medical management of chronic issues, cough, nasal congestion and ear pain.  Diagnoses and all orders for this visit:  Essential hypertension Well controlled   Depression, recurrent -     FLUoxetine  (PROZAC ) 20 MG capsule; Take 1 capsule (20 mg total) by mouth daily. -     hydrOXYzine  (ATARAX ) 10 MG tablet; Take 1 tablet (10 mg total) by mouth 3 (three) times daily as needed.  Anxiety -     FLUoxetine  (PROZAC ) 20 MG capsule; Take 1 capsule (20 mg total) by mouth daily. -     hydrOXYzine  (ATARAX ) 10 MG tablet; Take 1 tablet (10 mg total) by mouth 3 (three) times daily as needed.  URI with cough and congestion -     doxycycline (VIBRA-TABS) 100 MG tablet; Take 1 tablet (100 mg total) by mouth 2 (two) times daily for 7 days. -     fluticasone (FLONASE) 50 MCG/ACT nasal spray; Place 2 sprays into both nostrils daily.  RLS (restless legs syndrome) -     rOPINIRole (REQUIP) 0.25 MG tablet; Take 1 tablet (0.25 mg total) by mouth at bedtime.        Acute upper respiratory infection Symptoms include coughing  up phlegm and sinus congestion with fluid in the ears. Symptoms have been persistent and worsening despite home remedies. - Prescribed doxycycline for infection - Prescribed Flonase for sinus congestion and ear fluid - Advised use of Flonase for at least two weeks  Restless legs syndrome Symptoms include leg pain and jumping sensations at night, previously managed with gabapentin . Symptoms have returned after discontinuation of medication. Discussed potential underlying causes such as B12 or iron deficiencies. Requip is recommended for management. - Prescribed Requip 0.25 mg at bedtime - Advised taking an additional dose if symptoms persist after 45 minutes to an hour - Discussed potential use of [employer] for cost-effective medication options  Anxiety disorder Currently managed with fluoxetine  and hydroxyzine , which are effective. - Refilled fluoxetine  - Refilled hydroxyzine           Continue all other maintenance medications.  Follow up plan: Return in about 6 months (around 09/26/2024) for Annual Physical.   Continue healthy lifestyle choices, including diet (rich in fruits, vegetables, and lean proteins, and low in salt and simple carbohydrates) and exercise (at least 30 minutes of moderate physical activity daily).  Educational handout given for RLS  The above assessment and management plan was discussed with the patient. The patient verbalized understanding of and has agreed to the management plan. Patient is aware to call the clinic if they develop any new symptoms or if symptoms persist or worsen. Patient is aware when to return to the clinic for a follow-up visit. Patient educated on when it is appropriate to go to the emergency department.   Rosaline Bruns, FNP-C [facility] [phone]

## 2024-04-08 ENCOUNTER — Encounter: Payer: Self-pay | Admitting: Family Medicine

## 2024-04-08 DIAGNOSIS — J069 Acute upper respiratory infection, unspecified: Secondary | ICD-10-CM

## 2024-04-08 NOTE — Telephone Encounter (Signed)
Deferring to PCP's return tomorrow.

## 2024-04-09 MED ORDER — AZITHROMYCIN 500 MG PO TABS: 500.0000 mg | ORAL_TABLET | Freq: Every day | ORAL | 0 refills | Status: AC

## 2024-04-26 ENCOUNTER — Other Ambulatory Visit: Payer: Self-pay | Admitting: Family Medicine

## 2024-04-26 DIAGNOSIS — F419 Anxiety disorder, unspecified: Secondary | ICD-10-CM

## 2024-04-26 DIAGNOSIS — F339 Major depressive disorder, recurrent, unspecified: Secondary | ICD-10-CM

## 2024-05-07 ENCOUNTER — Ambulatory Visit: Payer: Self-pay | Admitting: Family Medicine

## 2024-05-07 ENCOUNTER — Encounter: Payer: Self-pay | Admitting: Family Medicine

## 2024-05-07 ENCOUNTER — Ambulatory Visit

## 2024-05-07 VITALS — BP 137/90 | HR 67 | Temp 98.1°F | Ht 62.0 in | Wt 205.4 lb

## 2024-05-07 DIAGNOSIS — R7303 Prediabetes: Secondary | ICD-10-CM | POA: Diagnosis not present

## 2024-05-07 DIAGNOSIS — M25561 Pain in right knee: Secondary | ICD-10-CM | POA: Diagnosis not present

## 2024-05-07 DIAGNOSIS — F339 Major depressive disorder, recurrent, unspecified: Secondary | ICD-10-CM | POA: Diagnosis not present

## 2024-05-07 DIAGNOSIS — M25562 Pain in left knee: Secondary | ICD-10-CM | POA: Diagnosis not present

## 2024-05-07 DIAGNOSIS — J301 Allergic rhinitis due to pollen: Secondary | ICD-10-CM

## 2024-05-07 DIAGNOSIS — K2100 Gastro-esophageal reflux disease with esophagitis, without bleeding: Secondary | ICD-10-CM | POA: Diagnosis not present

## 2024-05-07 DIAGNOSIS — G8929 Other chronic pain: Secondary | ICD-10-CM | POA: Diagnosis not present

## 2024-05-07 DIAGNOSIS — F419 Anxiety disorder, unspecified: Secondary | ICD-10-CM | POA: Diagnosis not present

## 2024-05-07 DIAGNOSIS — G2581 Restless legs syndrome: Secondary | ICD-10-CM

## 2024-05-07 DIAGNOSIS — E559 Vitamin D deficiency, unspecified: Secondary | ICD-10-CM | POA: Diagnosis not present

## 2024-05-07 DIAGNOSIS — R110 Nausea: Secondary | ICD-10-CM

## 2024-05-07 DIAGNOSIS — I10 Essential (primary) hypertension: Secondary | ICD-10-CM

## 2024-05-07 DIAGNOSIS — E66812 Obesity, class 2: Secondary | ICD-10-CM | POA: Diagnosis not present

## 2024-05-07 LAB — BAYER DCA HB A1C WAIVED: HB A1C (BAYER DCA - WAIVED): 5.6 % (ref 4.8–5.6)

## 2024-05-07 LAB — LIPID PANEL

## 2024-05-07 MED ORDER — FLUOXETINE HCL 20 MG PO CAPS: 20.0000 mg | ORAL_CAPSULE | Freq: Every day | ORAL | 1 refills | Status: AC

## 2024-05-07 MED ORDER — PANTOPRAZOLE SODIUM 20 MG PO TBEC: 20.0000 mg | DELAYED_RELEASE_TABLET | Freq: Every day | ORAL | 1 refills | Status: AC

## 2024-05-07 MED ORDER — ONDANSETRON 4 MG PO TBDP: 4.0000 mg | ORAL_TABLET | Freq: Three times a day (TID) | ORAL | 0 refills | Status: AC | PRN

## 2024-05-07 MED ORDER — HYDROXYZINE HCL 10 MG PO TABS: 10.0000 mg | ORAL_TABLET | Freq: Three times a day (TID) | ORAL | 1 refills | Status: AC | PRN

## 2024-05-07 MED ORDER — FLUTICASONE PROPIONATE 50 MCG/ACT NA SUSP: 2.0000 | Freq: Every day | NASAL | 6 refills | Status: AC

## 2024-05-07 MED ORDER — ROPINIROLE HCL 0.25 MG PO TABS: 0.5000 mg | ORAL_TABLET | Freq: Every day | ORAL | 1 refills | Status: AC

## 2024-05-07 MED ORDER — LISINOPRIL 40 MG PO TABS: 40.0000 mg | ORAL_TABLET | Freq: Every day | ORAL | 1 refills | Status: AC

## 2024-05-07 MED ORDER — WEGOVY 0.25 MG/0.5ML ~~LOC~~ SOAJ: 0.2500 mg | SUBCUTANEOUS | 3 refills | Status: AC

## 2024-05-07 MED ORDER — FUROSEMIDE 20 MG PO TABS: 20.0000 mg | ORAL_TABLET | Freq: Every day | ORAL | 1 refills | Status: AC

## 2024-05-07 NOTE — Progress Notes (Signed)
 "    Subjective:  Patient ID: Kelly Jennings, female    DOB: [birth year], 44 y.o.   MRN: [id number]  Patient Care Team: Severa Rock HERO, FNP as PCP - General (Family Medicine) Alvan Ronal BRAVO, MD (Inactive) as PCP - Cardiology (Cardiology)   Chief Complaint:  Knee Pain (Bilateral knee pain that has been ongoing )   HPI: Kelly Jennings is a 44 y.o. female presenting on 05/07/2024 for Knee Pain (Bilateral knee pain that has been ongoing )   Dis ROSEALEE Kelly Jennings is a 44 year old female who presents with knee pain and joint noises.  She experiences knee pain accompanied by 'popping and creaking' noises, particularly when ascending or descending stairs. The pain has progressively worsened over the past month, although she denies any recent injuries. She reports that after climbing stairs, she was unable to move for about two days. She recalls a previous knee injury approximately five to six years ago and was informed of arthritis at that time. She describes 'fluid pockets' in her knees but has not been utilizing any stretches or ice therapy, despite having access to an ice machine from her mother's knee surgery.  Her medical history includes depression, managed with fluoxetine  20 mg daily, and anxiety, for which she uses hydroxyzine  (Atarax ) as needed, typically twice daily. She also takes furosemide  20 mg and lisinopril  4 mg for hypertension, Zofran  for nausea, Protonix  for reflux, and Requip  for restless leg syndrome, noting that she requires two tablets for effectiveness. She recently obtained insurance and is seeking to refill her medications, having missed doses previously due to lack of coverage. She takes over-the-counter vitamin D  for deficiency, currently at 1000 units daily.  She reports a history of elevated cholesterol and glucose levels, although her thyroid , kidney, and liver functions were normal in August. She experiences restless leg syndrome, which she manages by walking around the  house to alleviate symptoms.          Relevant past medical, surgical, family, and social history reviewed and updated as indicated.  Allergies and medications reviewed and updated. Data reviewed: Chart in Epic.   Past Medical History:  Diagnosis Date   GERD (gastroesophageal reflux disease)    Hypertension    Migraines     Past Surgical History:  Procedure Laterality Date   CHOLECYSTECTOMY      Social History   Socioeconomic History   Marital status: Married    Spouse name: Not on file   Number of children: 2   Years of education: Not on file   Highest education level: Not on file  Occupational History   Not on file  Tobacco Use   Smoking status: Never   Smokeless tobacco: Never  Vaping Use   Vaping status: Never Used  Substance and Sexual Activity   Alcohol use: No   Drug use: No   Sexual activity: Yes  Other Topics Concern   Not on file  Social History Narrative   Not on file   Social Drivers of Health   Tobacco Use: Low Risk (05/07/2024)   Patient History    Smoking Tobacco Use: Never    Smokeless Tobacco Use: Never    Passive Exposure: Not on file  Financial Resource Strain: Not on file  Food Insecurity: Not on file  Transportation Needs: Not on file  Physical Activity: Not on file  Stress: Not on file  Social Connections: Not on file  Intimate Partner Violence: Not on file  Depression (PHQ2-9):  Medium Risk (05/07/2024)   Depression (PHQ2-9)    PHQ-2 Score: 8  Alcohol Screen: Not on file  Housing: Not on file  Utilities: Not on file  Health Literacy: Not on file    Outpatient Encounter Medications as of 05/07/2024  Medication Sig   metoprolol  succinate (TOPROL  XL) 25 MG 24 hr tablet Take 1 tablet (25 mg total) by mouth daily.   semaglutide -weight management (WEGOVY ) 0.25 MG/0.5ML SOAJ SQ injection Inject 0.25 mg into the skin once a week.   [DISCONTINUED] FLUoxetine  (PROZAC ) 20 MG capsule Take 1 capsule (20 mg total) by mouth daily.    [DISCONTINUED] fluticasone  (FLONASE ) 50 MCG/ACT nasal spray Place 2 sprays into both nostrils daily.   [DISCONTINUED] furosemide  (LASIX ) 20 MG tablet Take 1-2 tablets (20-40 mg total) by mouth daily.   [DISCONTINUED] hydrOXYzine  (ATARAX ) 10 MG tablet TAKE 1 TO 2 TABLETS (10 TO 20MG  TOTAL) BY MOUTH 3 TIMES A DAY AS NEEDED FOR ANXIETY   [DISCONTINUED] lisinopril  (ZESTRIL ) 40 MG tablet Take 1 tablet (40 mg total) by mouth daily.   [DISCONTINUED] ondansetron  (ZOFRAN -ODT) 4 MG disintegrating tablet Take 1 tablet (4 mg total) by mouth every 8 (eight) hours as needed for nausea or vomiting.   [DISCONTINUED] pantoprazole  (PROTONIX ) 20 MG tablet Take 1 tablet (20 mg total) by mouth daily.   [DISCONTINUED] rOPINIRole  (REQUIP ) 0.25 MG tablet Take 1 tablet (0.25 mg total) by mouth at bedtime.   FLUoxetine  (PROZAC ) 20 MG capsule Take 1 capsule (20 mg total) by mouth daily.   fluticasone  (FLONASE ) 50 MCG/ACT nasal spray Place 2 sprays into both nostrils daily.   furosemide  (LASIX ) 20 MG tablet Take 1-2 tablets (20-40 mg total) by mouth daily.   hydrOXYzine  (ATARAX ) 10 MG tablet Take 1-2 tablets (10-20 mg total) by mouth 3 (three) times daily as needed.   lisinopril  (ZESTRIL ) 40 MG tablet Take 1 tablet (40 mg total) by mouth daily.   ondansetron  (ZOFRAN -ODT) 4 MG disintegrating tablet Take 1 tablet (4 mg total) by mouth every 8 (eight) hours as needed for nausea or vomiting.   pantoprazole  (PROTONIX ) 20 MG tablet Take 1 tablet (20 mg total) by mouth daily.   rOPINIRole  (REQUIP ) 0.25 MG tablet Take 2 tablets (0.5 mg total) by mouth at bedtime.   No facility-administered encounter medications on file as of 05/07/2024.    Allergies[1]  Pertinent ROS per HPI, otherwise unremarkable      Objective:  BP (!) 137/90   Pulse 67   Temp 98.1 F (36.7 C)   Ht 5' 2 (1.575 m)   Wt 205 lb 6.4 oz (93.2 kg)   SpO2 96%   BMI 37.57 kg/m    Wt Readings from Last 3 Encounters:  05/07/24 205 lb 6.4 oz (93.2 kg)   03/28/24 201 lb (91.2 kg)  02/07/24 197 lb 12.8 oz (89.7 kg)    Physical Exam Vitals and nursing note reviewed.  Constitutional:      General: She is not in acute distress.    Appearance: Normal appearance. She is well-developed and well-groomed. She is obese. She is not ill-appearing, toxic-appearing or diaphoretic.  HENT:     Head: Normocephalic and atraumatic.     Jaw: There is normal jaw occlusion.     Right Ear: Hearing normal.     Left Ear: Hearing normal.     Nose: Nose normal.     Mouth/Throat:     Lips: Pink.     Mouth: Mucous membranes are moist.     Pharynx: Oropharynx  is clear. Uvula midline.  Eyes:     General: Lids are normal.     Extraocular Movements: Extraocular movements intact.     Conjunctiva/sclera: Conjunctivae normal.     Pupils: Pupils are equal, round, and reactive to light.  Neck:     Thyroid : No thyroid  mass, thyromegaly or thyroid  tenderness.     Vascular: No carotid bruit or JVD.     Trachea: Trachea and phonation normal.  Cardiovascular:     Rate and Rhythm: Normal rate and regular rhythm.     Chest Wall: PMI is not displaced.     Pulses: Normal pulses.     Heart sounds: Normal heart sounds. No murmur heard.    No friction rub. No gallop.  Pulmonary:     Effort: Pulmonary effort is normal. No respiratory distress.     Breath sounds: Normal breath sounds. No wheezing.  Abdominal:     General: Bowel sounds are normal. There is no distension or abdominal bruit.     Palpations: Abdomen is soft. There is no hepatomegaly or splenomegaly.     Tenderness: There is no abdominal tenderness. There is no right CVA tenderness or left CVA tenderness.     Hernia: No hernia is present.  Musculoskeletal:        General: Normal range of motion.     Cervical back: Normal range of motion and neck supple.     Right upper leg: Normal.     Left upper leg: Normal.     Right knee: Crepitus present. No swelling, deformity or bony tenderness. Normal range of  motion. No tenderness.     Left knee: Crepitus present. No swelling, deformity or bony tenderness. Normal range of motion. No tenderness.     Right lower leg: Normal. No edema.     Left lower leg: Normal. No edema.  Lymphadenopathy:     Cervical: No cervical adenopathy.  Skin:    General: Skin is warm and dry.     Capillary Refill: Capillary refill takes less than 2 seconds.     Coloration: Skin is not cyanotic, jaundiced or pale.     Findings: No rash.  Neurological:     General: No focal deficit present.     Mental Status: She is alert and oriented to person, place, and time.     Sensory: Sensation is intact.     Motor: Motor function is intact.     Coordination: Coordination is intact.     Gait: Gait is intact.     Deep Tendon Reflexes: Reflexes are normal and symmetric.  Psychiatric:        Attention and Perception: Attention and perception normal.        Mood and Affect: Mood and affect normal.        Speech: Speech normal.        Behavior: Behavior normal. Behavior is cooperative.        Thought Content: Thought content normal.        Cognition and Memory: Cognition and memory normal.        Judgment: Judgment normal.      Results for orders placed or performed during the hospital encounter of 02/06/24  ECHOCARDIOGRAM COMPLETE   Collection Time: 02/06/24  1:29 PM  Result Value Ref Range   Area-P 1/2 2.95 cm2   S' Lateral 2.50 cm   Est EF 60 - 65%        Pertinent labs & imaging results that were available during my  care of the patient were reviewed by me and considered in my medical decision making.  Assessment & Plan:  Melayna was seen today for knee pain.  Diagnoses and all orders for this visit:  Chronic pain of both knees -     DG Knee 1-2 Views Left -     DG Knee 1-2 Views Right  Depression, recurrent -     FLUoxetine  (PROZAC ) 20 MG capsule; Take 1 capsule (20 mg total) by mouth daily. -     hydrOXYzine  (ATARAX ) 10 MG tablet; Take 1-2 tablets (10-20 mg  total) by mouth 3 (three) times daily as needed. -     Anemia Profile B -     CMP14+EGFR -     VITAMIN D  25 Hydroxy (Vit-D Deficiency, Fractures) -     TSH -     T4, Free  Anxiety -     FLUoxetine  (PROZAC ) 20 MG capsule; Take 1 capsule (20 mg total) by mouth daily. -     hydrOXYzine  (ATARAX ) 10 MG tablet; Take 1-2 tablets (10-20 mg total) by mouth 3 (three) times daily as needed. -     Anemia Profile B -     CMP14+EGFR -     VITAMIN D  25 Hydroxy (Vit-D Deficiency, Fractures) -     TSH -     T4, Free  Essential hypertension -     furosemide  (LASIX ) 20 MG tablet; Take 1-2 tablets (20-40 mg total) by mouth daily. -     lisinopril  (ZESTRIL ) 40 MG tablet; Take 1 tablet (40 mg total) by mouth daily. -     Anemia Profile B -     CMP14+EGFR -     Lipid panel -     TSH -     T4, Free -     semaglutide -weight management (WEGOVY ) 0.25 MG/0.5ML SOAJ SQ injection; Inject 0.25 mg into the skin once a week.  Nausea in adult -     ondansetron  (ZOFRAN -ODT) 4 MG disintegrating tablet; Take 1 tablet (4 mg total) by mouth every 8 (eight) hours as needed for nausea or vomiting. -     CMP14+EGFR  Gastroesophageal reflux disease with esophagitis without hemorrhage -     pantoprazole  (PROTONIX ) 20 MG tablet; Take 1 tablet (20 mg total) by mouth daily. -     Anemia Profile B -     CMP14+EGFR  RLS (restless legs syndrome) -     rOPINIRole  (REQUIP ) 0.25 MG tablet; Take 2 tablets (0.5 mg total) by mouth at bedtime. -     Anemia Profile B -     CMP14+EGFR -     Lipid panel -     VITAMIN D  25 Hydroxy (Vit-D Deficiency, Fractures) -     TSH -     T4, Free  Morbid obesity (HCC) -     Anemia Profile B -     CMP14+EGFR -     Lipid panel -     VITAMIN D  25 Hydroxy (Vit-D Deficiency, Fractures) -     TSH -     T4, Free -     Bayer DCA Hb A1c Waived -     semaglutide -weight management (WEGOVY ) 0.25 MG/0.5ML SOAJ SQ injection; Inject 0.25 mg into the skin once a week.  Prediabetes -     Anemia  Profile B -     CMP14+EGFR -     Bayer DCA Hb A1c Waived -     semaglutide -weight management (WEGOVY ) 0.25 MG/0.5ML SOAJ SQ  injection; Inject 0.25 mg into the skin once a week.  Vitamin D  deficiency -     Anemia Profile B -     CMP14+EGFR  Seasonal allergic rhinitis due to pollen -     fluticasone  (FLONASE ) 50 MCG/ACT nasal spray; Place 2 sprays into both nostrils daily.      Chronic knee pain likely due to osteoarthritis Chronic knee pain with popping and creaking sounds, exacerbated by activities such as climbing stairs. No recent injuries reported. Pain has worsened over the past month. Likely osteoarthritis given the symptoms and history of knee injury. - Ordered knee x-rays to assess for arthritis progression - Advised icing the knees to reduce pain and inflammation - Will consider referral to physical therapy if symptoms do not improve - Will consider referral to orthopedics if x-ray shows significant arthritis  Morbid obesity Management discussed. Previous use of Wegovy  was discontinued due to adverse effects. Insurance coverage now available for medication. - Prescribed Wegovy  and submitted prior authorization - Advised to call once medication is received to schedule an 8-week follow-up  Major depressive disorder, recurrent Currently managed with fluoxetine  20 mg daily. No reported issues with current regimen. - Continue fluoxetine  20 mg daily  Anxiety disorder Managed with hydroxyzine  as needed. Currently taking hydroxyzine  twice daily. - Continue hydroxyzine  as needed  Essential hypertension Managed with furosemide  20 mg and lisinopril  4 mg. Blood pressure slightly elevated today despite medication adherence. - Continue furosemide  20 mg and lisinopril  4 mg - Monitor blood pressure and report if consistently above 130/80 mmHg  Restless legs syndrome Managed with Requip . Current dose is insufficient, requiring two tablets for relief. - Adjusted Requip  prescription to  0.5 mg to provide sufficient quantity for dosing  Gastroesophageal reflux disease with esophagitis Managed with Protonix . - Continue Protonix  as prescribed  Nausea Managed with Zofran  as needed. - Continue Zofran  as needed  Vitamin D  deficiency Managed with over-the-counter vitamin D  supplementation. Currently taking 1000 IU daily. - Ordered vitamin D  level to assess current status - Continue vitamin D  supplementation at 1000 IU daily  Prediabetes Previous labs showed slightly elevated glucose levels. No recent A1c due to lack of insurance coverage. - Ordered A1c to assess current glucose control - Ordered comprehensive lab work including cholesterol and thyroid  function tests        I spent 45 minutes dedicated to the care of this patient on the date of this encounter to include pre-visit review of records including diagnostic studies and labs, face-to-face time with the patient discussing above conditions, post visit ordering of testing, clinical documentation in the electronic health record, making appropriate referrals as documented, and communicating necessary information to the patient's healthcare team.    Continue all other maintenance medications.  Follow up plan: Return if symptoms worsen or fail to improve.   Continue healthy lifestyle choices, including diet (rich in fruits, vegetables, and lean proteins, and low in salt and simple carbohydrates) and exercise (at least 30 minutes of moderate physical activity daily).  Educational handout given for health maintenance   The above assessment and management plan was discussed with the patient. The patient verbalized understanding of and has agreed to the management plan. Patient is aware to call the clinic if they develop any new symptoms or if symptoms persist or worsen. Patient is aware when to return to the clinic for a follow-up visit. Patient educated on when it is appropriate to go to the emergency department.    Rosaline Bruns, FNP-C [facility] [phone]      [1]  Allergies Allergen Reactions   Penicillins Rash    Rash as an infant   "

## 2024-05-08 LAB — ANEMIA PROFILE B
Basophils Absolute: 0 x10E3/uL (ref 0.0–0.2)
Basos: 1 %
EOS (ABSOLUTE): 0.2 x10E3/uL (ref 0.0–0.4)
Eos: 3 %
Ferritin: 43 ng/mL (ref 15–150)
Folate: 8.5 ng/mL
Hematocrit: 38.2 % (ref 34.0–46.6)
Hemoglobin: 12.4 g/dL (ref 11.1–15.9)
Immature Grans (Abs): 0 x10E3/uL (ref 0.0–0.1)
Immature Granulocytes: 0 %
Iron Saturation: 11 % — AB (ref 15–55)
Iron: 48 ug/dL (ref 27–159)
Lymphocytes Absolute: 1.9 x10E3/uL (ref 0.7–3.1)
Lymphs: 29 %
MCH: 29.7 pg (ref 26.6–33.0)
MCHC: 32.5 g/dL (ref 31.5–35.7)
MCV: 91 fL (ref 79–97)
Monocytes Absolute: 0.5 x10E3/uL (ref 0.1–0.9)
Monocytes: 7 %
Neutrophils Absolute: 4 x10E3/uL (ref 1.4–7.0)
Neutrophils: 60 %
Platelets: 320 x10E3/uL (ref 150–450)
RBC: 4.18 x10E6/uL (ref 3.77–5.28)
RDW: 13.4 % (ref 11.7–15.4)
Retic Ct Pct: 1.8 % (ref 0.6–2.6)
Total Iron Binding Capacity: 457 ug/dL — AB (ref 250–450)
UIBC: 409 ug/dL (ref 131–425)
Vitamin B-12: 477 pg/mL (ref 232–1245)
WBC: 6.6 x10E3/uL (ref 3.4–10.8)

## 2024-05-08 LAB — CMP14+EGFR
ALT: 41 IU/L — AB (ref 0–32)
AST: 26 IU/L (ref 0–40)
Albumin: 4.8 g/dL (ref 3.9–4.9)
Alkaline Phosphatase: 95 IU/L (ref 41–116)
BUN/Creatinine Ratio: 15 (ref 9–23)
BUN: 12 mg/dL (ref 6–24)
Bilirubin Total: 0.5 mg/dL (ref 0.0–1.2)
CO2: 25 mmol/L (ref 20–29)
Calcium: 9.7 mg/dL (ref 8.7–10.2)
Chloride: 100 mmol/L (ref 96–106)
Creatinine, Ser: 0.79 mg/dL (ref 0.57–1.00)
Globulin, Total: 2.5 g/dL (ref 1.5–4.5)
Glucose: 85 mg/dL (ref 70–99)
Potassium: 4.8 mmol/L (ref 3.5–5.2)
Sodium: 138 mmol/L (ref 134–144)
Total Protein: 7.3 g/dL (ref 6.0–8.5)
eGFR: 95 mL/min/1.73

## 2024-05-08 LAB — LIPID PANEL
Cholesterol, Total: 239 mg/dL — AB (ref 100–199)
HDL: 50 mg/dL
LDL CALC COMMENT:: 4.8 ratio — AB (ref 0.0–4.4)
LDL Chol Calc (NIH): 143 mg/dL — AB (ref 0–99)
Triglycerides: 255 mg/dL — AB (ref 0–149)
VLDL Cholesterol Cal: 46 mg/dL — AB (ref 5–40)

## 2024-05-08 LAB — T4, FREE: Free T4: 1.27 ng/dL (ref 0.82–1.77)

## 2024-05-08 LAB — TSH: TSH: 0.854 u[IU]/mL (ref 0.450–4.500)

## 2024-05-08 LAB — VITAMIN D 25 HYDROXY (VIT D DEFICIENCY, FRACTURES): Vit D, 25-Hydroxy: 34.7 ng/mL (ref 30.0–100.0)

## 2024-05-10 ENCOUNTER — Ambulatory Visit: Payer: Self-pay | Admitting: Family Medicine

## 2024-05-13 ENCOUNTER — Encounter: Payer: Self-pay | Admitting: Family Medicine

## 2024-05-14 ENCOUNTER — Ambulatory Visit: Payer: Self-pay | Admitting: Family Medicine

## 2024-07-16 ENCOUNTER — Ambulatory Visit: Admitting: Family Medicine

## 2024-09-26 ENCOUNTER — Encounter: Payer: Self-pay | Admitting: Family Medicine
